# Patient Record
Sex: Female | Born: 1950 | Race: White | Hispanic: No | Marital: Married | Smoking: Former smoker
Health system: Southern US, Community
[De-identification: ages and names within clinical notes are randomized; demographics above are authoritative.]

## PROBLEM LIST (undated history)

## (undated) DIAGNOSIS — F329 Major depressive disorder, single episode, unspecified: Secondary | ICD-10-CM

## (undated) DIAGNOSIS — G4700 Insomnia, unspecified: Secondary | ICD-10-CM

## (undated) DIAGNOSIS — IMO0001 Reserved for inherently not codable concepts without codable children: Secondary | ICD-10-CM

## (undated) DIAGNOSIS — R112 Nausea with vomiting, unspecified: Secondary | ICD-10-CM

## (undated) DIAGNOSIS — D649 Anemia, unspecified: Secondary | ICD-10-CM

## (undated) DIAGNOSIS — Z5111 Encounter for antineoplastic chemotherapy: Secondary | ICD-10-CM

## (undated) DIAGNOSIS — M1990 Unspecified osteoarthritis, unspecified site: Secondary | ICD-10-CM

## (undated) DIAGNOSIS — E785 Hyperlipidemia, unspecified: Secondary | ICD-10-CM

## (undated) DIAGNOSIS — Z9889 Other specified postprocedural states: Secondary | ICD-10-CM

## (undated) DIAGNOSIS — F32A Depression, unspecified: Secondary | ICD-10-CM

## (undated) DIAGNOSIS — R51 Headache: Secondary | ICD-10-CM

## (undated) DIAGNOSIS — C801 Malignant (primary) neoplasm, unspecified: Secondary | ICD-10-CM

## (undated) DIAGNOSIS — I10 Essential (primary) hypertension: Secondary | ICD-10-CM

## (undated) DIAGNOSIS — R519 Headache, unspecified: Secondary | ICD-10-CM

## (undated) DIAGNOSIS — R918 Other nonspecific abnormal finding of lung field: Secondary | ICD-10-CM

## (undated) HISTORY — DX: Hyperlipidemia, unspecified: E78.5

## (undated) HISTORY — DX: Unspecified osteoarthritis, unspecified site: M19.90

## (undated) HISTORY — DX: Depression, unspecified: F32.A

## (undated) HISTORY — DX: Other nonspecific abnormal finding of lung field: R91.8

## (undated) HISTORY — DX: Encounter for antineoplastic chemotherapy: Z51.11

## (undated) HISTORY — PX: BACK SURGERY: SHX140

## (undated) HISTORY — DX: Essential (primary) hypertension: I10

## (undated) HISTORY — PX: ABDOMINAL HYSTERECTOMY: SHX81

## (undated) HISTORY — DX: Major depressive disorder, single episode, unspecified: F32.9

## (undated) HISTORY — DX: Insomnia, unspecified: G47.00

## (undated) HISTORY — PX: COLONOSCOPY: SHX174

---

## 1999-04-03 ENCOUNTER — Other Ambulatory Visit: Admission: RE | Admit: 1999-04-03 | Discharge: 1999-04-03 | Payer: Self-pay | Admitting: Obstetrics and Gynecology

## 1999-04-13 ENCOUNTER — Ambulatory Visit (HOSPITAL_COMMUNITY): Admission: RE | Admit: 1999-04-13 | Discharge: 1999-04-13 | Payer: Self-pay | Admitting: Obstetrics and Gynecology

## 1999-04-13 ENCOUNTER — Encounter: Payer: Self-pay | Admitting: Obstetrics and Gynecology

## 1999-04-15 ENCOUNTER — Encounter: Payer: Self-pay | Admitting: Obstetrics and Gynecology

## 1999-04-15 ENCOUNTER — Encounter (INDEPENDENT_AMBULATORY_CARE_PROVIDER_SITE_OTHER): Payer: Self-pay

## 1999-04-15 ENCOUNTER — Ambulatory Visit (HOSPITAL_COMMUNITY): Admission: RE | Admit: 1999-04-15 | Discharge: 1999-04-15 | Payer: Self-pay | Admitting: Obstetrics and Gynecology

## 2000-10-21 ENCOUNTER — Encounter: Payer: Self-pay | Admitting: Obstetrics and Gynecology

## 2000-10-21 ENCOUNTER — Encounter: Admission: RE | Admit: 2000-10-21 | Discharge: 2000-10-21 | Payer: Self-pay | Admitting: Obstetrics and Gynecology

## 2000-11-16 ENCOUNTER — Other Ambulatory Visit: Admission: RE | Admit: 2000-11-16 | Discharge: 2000-11-16 | Payer: Self-pay | Admitting: Obstetrics and Gynecology

## 2001-12-26 ENCOUNTER — Encounter: Payer: Self-pay | Admitting: Obstetrics and Gynecology

## 2001-12-26 ENCOUNTER — Encounter: Admission: RE | Admit: 2001-12-26 | Discharge: 2001-12-26 | Payer: Self-pay | Admitting: Obstetrics and Gynecology

## 2002-01-01 ENCOUNTER — Other Ambulatory Visit: Admission: RE | Admit: 2002-01-01 | Discharge: 2002-01-01 | Payer: Self-pay | Admitting: Obstetrics and Gynecology

## 2003-01-02 ENCOUNTER — Other Ambulatory Visit: Admission: RE | Admit: 2003-01-02 | Discharge: 2003-01-02 | Payer: Self-pay | Admitting: Obstetrics and Gynecology

## 2003-01-15 ENCOUNTER — Encounter: Payer: Self-pay | Admitting: Obstetrics and Gynecology

## 2003-01-15 ENCOUNTER — Encounter: Admission: RE | Admit: 2003-01-15 | Discharge: 2003-01-15 | Payer: Self-pay | Admitting: Obstetrics and Gynecology

## 2004-03-02 ENCOUNTER — Other Ambulatory Visit: Admission: RE | Admit: 2004-03-02 | Discharge: 2004-03-02 | Payer: Self-pay | Admitting: Obstetrics and Gynecology

## 2004-03-13 ENCOUNTER — Encounter: Admission: RE | Admit: 2004-03-13 | Discharge: 2004-03-13 | Payer: Self-pay | Admitting: Obstetrics and Gynecology

## 2007-09-28 ENCOUNTER — Ambulatory Visit (HOSPITAL_COMMUNITY): Admission: RE | Admit: 2007-09-28 | Discharge: 2007-09-28 | Payer: Self-pay | Admitting: Family Medicine

## 2007-10-25 ENCOUNTER — Ambulatory Visit: Admission: RE | Admit: 2007-10-25 | Discharge: 2007-10-25 | Payer: Self-pay | Admitting: Gynecologic Oncology

## 2007-11-14 ENCOUNTER — Encounter (INDEPENDENT_AMBULATORY_CARE_PROVIDER_SITE_OTHER): Payer: Self-pay | Admitting: Gynecologic Oncology

## 2007-11-14 ENCOUNTER — Ambulatory Visit (HOSPITAL_COMMUNITY): Admission: RE | Admit: 2007-11-14 | Discharge: 2007-11-15 | Payer: Self-pay | Admitting: Obstetrics & Gynecology

## 2007-12-19 ENCOUNTER — Ambulatory Visit: Admission: RE | Admit: 2007-12-19 | Discharge: 2007-12-19 | Payer: Self-pay | Admitting: Gynecologic Oncology

## 2008-08-03 ENCOUNTER — Encounter: Admission: RE | Admit: 2008-08-03 | Discharge: 2008-08-03 | Payer: Self-pay | Admitting: Family Medicine

## 2009-06-26 ENCOUNTER — Encounter: Admission: RE | Admit: 2009-06-26 | Discharge: 2009-06-26 | Payer: Self-pay | Admitting: Emergency Medicine

## 2010-02-26 ENCOUNTER — Emergency Department (HOSPITAL_COMMUNITY): Admission: EM | Admit: 2010-02-26 | Discharge: 2010-02-26 | Payer: Self-pay | Admitting: Emergency Medicine

## 2010-02-28 ENCOUNTER — Encounter: Payer: Self-pay | Admitting: Cardiovascular Disease

## 2010-03-01 ENCOUNTER — Emergency Department (HOSPITAL_COMMUNITY): Admission: EM | Admit: 2010-03-01 | Discharge: 2010-03-02 | Payer: Self-pay | Admitting: Emergency Medicine

## 2010-03-15 ENCOUNTER — Encounter: Payer: Self-pay | Admitting: Cardiovascular Disease

## 2010-03-27 DIAGNOSIS — R079 Chest pain, unspecified: Secondary | ICD-10-CM

## 2010-03-27 DIAGNOSIS — M129 Arthropathy, unspecified: Secondary | ICD-10-CM | POA: Insufficient documentation

## 2010-03-27 DIAGNOSIS — E7800 Pure hypercholesterolemia, unspecified: Secondary | ICD-10-CM | POA: Insufficient documentation

## 2010-03-30 ENCOUNTER — Ambulatory Visit: Payer: Self-pay | Admitting: Cardiovascular Disease

## 2010-03-30 DIAGNOSIS — I10 Essential (primary) hypertension: Secondary | ICD-10-CM | POA: Insufficient documentation

## 2010-03-30 DIAGNOSIS — F17200 Nicotine dependence, unspecified, uncomplicated: Secondary | ICD-10-CM

## 2010-04-01 ENCOUNTER — Encounter: Payer: Self-pay | Admitting: Cardiovascular Disease

## 2010-04-14 ENCOUNTER — Telehealth (INDEPENDENT_AMBULATORY_CARE_PROVIDER_SITE_OTHER): Payer: Self-pay

## 2010-04-15 ENCOUNTER — Ambulatory Visit: Payer: Self-pay

## 2010-04-15 ENCOUNTER — Ambulatory Visit (HOSPITAL_COMMUNITY): Admission: RE | Admit: 2010-04-15 | Discharge: 2010-04-15 | Payer: Self-pay | Admitting: Cardiovascular Disease

## 2010-04-15 ENCOUNTER — Ambulatory Visit: Payer: Self-pay | Admitting: Cardiology

## 2010-06-18 ENCOUNTER — Ambulatory Visit
Admission: RE | Admit: 2010-06-18 | Discharge: 2010-06-18 | Payer: Self-pay | Source: Home / Self Care | Attending: Gynecology | Admitting: Gynecology

## 2010-06-18 ENCOUNTER — Other Ambulatory Visit
Admission: RE | Admit: 2010-06-18 | Discharge: 2010-06-18 | Payer: Self-pay | Source: Home / Self Care | Admitting: Gynecology

## 2010-07-04 ENCOUNTER — Encounter: Payer: Self-pay | Admitting: Internal Medicine

## 2010-07-05 ENCOUNTER — Encounter: Payer: Self-pay | Admitting: Family Medicine

## 2010-07-09 ENCOUNTER — Other Ambulatory Visit: Payer: Self-pay | Admitting: Gastroenterology

## 2010-07-14 NOTE — Assessment & Plan Note (Signed)
Summary: np3/chest pain   History of Present Illness: History aided by son who inteprets.  60 yo smoker with HTN and and elevated lipids recently seen in urgent care for elevated BP associated with thobbing headache and ? SSCP.  Started on diuretic with improvement.  Preveious stress test at [facility] ? 2-3 years ago normal. From [place] and does not really like [employer]/  Son indicates she has some anxiety and has had 2 " nervous breakdowns"  Complains of pressing pain near heart.  Not always exertional  intermitant.  Also some pain under left ribs.  No associated pleurisy, SOB or edema.  Smokes about a ppd.  Counseled for less than 10 minutes on cessation.  Reviewed ECG from urgernt care and normal  Current Problems (verified): 1)  Chest Pain  (ICD-786.50) 2)  Hypercholesterolemia  (ICD-272.0) 3)  Arthritis  (ICD-716.90)  Current Medications (verified): 1)  Hydrochlorothiazide 25 Mg Tabs (Hydrochlorothiazide) .... Take One Tablet By Mouth Daily. 2)  Fluoxetine Hcl 20 Mg Caps (Fluoxetine Hcl) .Marland Kitchen.. 1 By Mouth Daily  Allergies (verified): 1)  ! Pcn  Past History:  Past Medical History: Last updated: 03/27/2010 CHEST PAIN  HYPERCHOLESTEROLEMIA ARTHRITIS HTN:  Seen in ER 9/27 started on HCTZ  Past Surgical History: Last updated: 03/27/2010 Total laparoscopic hysterectomy, with bilateral salpingo-  oophorectomy.      Family History: Last updated: 03/27/2010 There are no GYN malignancies.   Social History: Last updated: 03/27/2010  She moved here 9 years ago from [place] and [place].   She has family here.  She smokes about a pack per day for more than 30   years.  She denies the use of alcohol.  She works in Education officer, environmental.      Review of Systems       Denies fever, malais, weight loss, blurry vision, decreased visual acuity, cough, sputum, SOB, hemoptysis, pleuritic pain, palpitaitons, heartburn, abdominal pain, melena, lower extremity edema, claudication, or rash.   Vital  Signs:  Patient profile:   60 year old female Height:      65 inches Weight:      125 pounds BMI:     20.88 Pulse rate:   68 / minute Resp:     16 per minute BP sitting:   122 / 68  Vitals Entered By: Marrion Coy, CNA (March 30, 2010 11:29 AM)  Physical Exam  General:  Affect appropriate Healthy:  appears stated age HEENT: normal Neck supple with no adenopathy JVP normal no bruits no thyromegaly Lungs clear with no wheezing and good diaphragmatic motion Heart:  S1/S2 no murmur,rub, gallop or click PMI normal Abdomen: benighn, BS positve, no tenderness, no AAA no bruit.  No HSM or HJR Distal pulses intact with no bruits No edema Neuro non-focal Skin warm and dry    Impression & Recommendations:  Problem # 1:  CHEST PAIN (ICD-786.50) Atypical with multiple risk factors.  Stress echo Orders: Stress Echo (Stress Echo)  Problem # 2:  HYPERCHOLESTEROLEMIA (ICD-272.0) Continue statin.  F/U labs urgent care  Problem # 3:  ESSENTIAL HYPERTENSION, BENIGN (ICD-401.1)  Improved control with diuretic Her updated medication list for this problem includes:    Hydrochlorothiazide 25 Mg Tabs (Hydrochlorothiazide) .Marland Kitchen... Take one tablet by mouth daily.  Her updated medication list for this problem includes:    Hydrochlorothiazide 25 Mg Tabs (Hydrochlorothiazide) .Marland Kitchen... Take one tablet by mouth daily.  Problem # 4:  SMOKER (ICD-305.1) Candidate for patch if stress echo normal  Patient Instructions: 1)  Your physician  recommends that you schedule a follow-up appointment in: 6 months 2)  Your physician has requested that you have a stress echocardiogram. For further information please visit [email or web address]  Please follow instruction sheet as given. Prescriptions: HYDROCHLOROTHIAZIDE 25 MG TABS (HYDROCHLOROTHIAZIDE) Take one tablet by mouth daily.  #90 x 3   Entered by:   Ledon Snare, RN   Authorized by:   Colon Branch, MD, [facility]   Signed by:   Ledon Snare, RN  on 03/30/2010   Method used:   Electronically to        [facility]. [phone]* (retail)       [address]       [place], [place]  [address]       Ph: [phone]       Fax: [phone]   RxID:   [id number] HYDROCHLOROTHIAZIDE 25 MG TABS (HYDROCHLOROTHIAZIDE) Take one tablet by mouth daily.  #90 x 3   Entered by:   Ledon Snare, RN   Authorized by:   Colon Branch, MD, [facility]   Signed by:   Ledon Snare, RN on 03/30/2010   Method used:   Historical   RxID:   [id number]

## 2010-07-14 NOTE — Letter (Signed)
Summary: Release of Responsibility for Interpretation  Release of Responsibility for Interpretation   Imported By: Lenard Forth 04/01/2010 11:26:29  _____________________________________________________________________  External Attachment:    Type:   Image     Comment:   External Document

## 2010-07-14 NOTE — Progress Notes (Signed)
Summary: Stress Echo Pre-Procedure  Phone Note Outgoing Call Call back at [phone] [facility])   Call placed by: Irean Hong, RN,  April 14, 2010 1:34 PM Summary of Call: Reviewed instructions with patient's son for Stress Echo tomorrow.The patient refuses interpreter, she wants her son to interpret. Interpreter requested to witness the patient signing form that she wants her son to interpret. Lashonna Rieke,RN.

## 2010-07-14 NOTE — Letter (Signed)
Summary: [facility]  [facility]   Imported By: Marylou Mccoy 03/27/2010 15:47:36  _____________________________________________________________________  External Attachment:    Type:   Image     Comment:   External Document

## 2010-08-27 LAB — BASIC METABOLIC PANEL
BUN: 11 mg/dL (ref 6–23)
CO2: 27 mEq/L (ref 19–32)
Calcium: 8.8 mg/dL (ref 8.4–10.5)
Creatinine, Ser: 0.54 mg/dL (ref 0.4–1.2)
GFR calc Af Amer: >60 mL/min (ref >60)
Glucose, Bld: 117 mg/dL — ABNORMAL HIGH (ref 70–99)

## 2010-10-27 NOTE — Op Note (Signed)
[name], [name]                 ACCOUNT NO.:  [id number]   MEDICAL RECORD NO.:  [id number]          PATIENT TYPE:  AMB   LOCATION:  DAY                          FACILITY:  [facility]   PHYSICIAN:  John T. Kyla Balzarine, M.D.    DATE OF BIRTH:  [birth year]   DATE OF PROCEDURE:  11/14/2007  DATE OF DISCHARGE:                               OPERATIVE REPORT   SURGEON:  John T. Kyla Balzarine, M.D.   ASSISTANT:  Roseanna Rainbow, M.D.   PREOPERATIVE DIAGNOSIS:  Pelvic mass and leiomyomata uteri.   POSTOPERATIVE DIAGNOSIS:  Right ovarian fibroma, leiomyomata uteri.   PROCEDURE:  Total laparoscopic hysterectomy, with bilateral salpingo-  oophorectomy.   ANESTHESIA:  General endotracheal.   FINDINGS AND INDICATIONS FOR SURGERY:  This 60 year old woman presented  with an adnexal mass, solid and cystic on ultrasound and measuring  approximately 7 cm.  She had known leiomyomata.  Examination under  anesthesia confirmed a firm mass in the posterior cul-de-sac.  At  laparoscopy, there was no evidence of upper abdominal carcinomatosis or  abnormalities.  The right ovary was replaced by a solid and cystic mass  measuring 7 cm in greatest diameter.  This was morcellated within the  EndoCatch bag for removal, and frozen section returned a benign ovarian  fibroma.   DESCRIPTION OF PROCEDURE:  The patient was prepped and draped in the low  lithotomy position with direct placement stirrups after induction of  anesthesia.  Examination under anesthesia revealed the findings  described above, and a sterile Foley catheter was placed.  The cervix  was grasped with a tenaculum and serially dilated to a 21-French Shawnie Pons.  The ZUMI manipulator with a large KOH ring was placed to facilitate  uterine manipulation.  A subumbilical skin incision was made, and the  peritoneal cavity was accessed with a 10 mm scope using the OptiVu  direct trocar placement.  After pneumoperitoneum was established,  additional 5 mm trocars  were placed in the right and left lower  quadrants outside the inferior epigastric vessels, and a 12 mm port was  placed in the suprapubic region.  All trocar sites were preinjected and  trocars inserted under direct visualization.  Washings were obtained  from the pelvis and saved for cytology.  The patient was placed in steep  Trendelenburg, and the pelvic findings were as described above.   Initially, a right salpingo-oophorectomy was performed.  The right round  ligament was divided and cauterized.  The harmonic scalpel was used for  all vascular pedicles unless otherwise specified.  The peritoneum of the  broad ligament lateral to the infundibulopelvic ligament was opened, the  ureter visualized, and the medial leaf of the broad ligament opened  above the ureter from pelvic brim to the uterus.  The right  infundibulopelvic ligament was isolated and divided.  The tube and ovary  were amputated from the uterus using the harmonic scalpel to divide the  fallopian tube and utero-ovarian ligament.  An EndoCatch bag was placed,  and the specimen was placed in the bag, which was brought through the  suprapubic port site.  The right adnexal mass was fragmented to  facilitate removal and submitted for frozen section.  This subsequently  returned a benign ovarian fibroma.   Total laparoscopic hysterectomy with left salpingo-oophorectomy was then  performed.  The left broad ligament was divided, broad ligament opened  and infundibulopelvic ligament skeletonized above the visualized ureter.  The left anterior peritoneal reflection of the uterus was opened and the  bladder flap advanced off of the lower segment and cervix until the KOH  ring was identified.  The posterior peritoneal reflection of the uterine  arteries were opened and uterine vessels skeletonized.  The uterine  vessels were divided at approximately the level of the KOH ring.   The anterior peritoneal reflection of the peritoneum  was then incised on  the right side, uterine vessels skeletonized and transected.  Vaginotomy  was performed using monopolar cautery along the edge of the KOH ring,  and the specimen was delivered vaginally.  The Pneumo Occluder blade was  replaced in the vagina, and vaginal cuff closed with interrupted figure-  of-eight sutures of 0 Vicryl Unit using the Endo stitch and  extracorporeal knot tying techniques.  The pelvis was copiously  irrigated and hemostasis ensured.  Pneumoperitoneum was deflated and all  trocars removed.  The suprapubic and subumbilical fascia was closed with  interrupted 0 Vicryl and skin with interrupted subcuticular 3-0 Vicryl.  The skin of the sideport 5 mm incisions were closed with surgical glue.  The patient tolerated the procedure well and was returned to the  recovery room in stable condition.   ESTIMATED BLOOD LOSS:  100 mL.   TRANSFUSIONS:  None.   DRAINS, PACKS, ETC.:  Foley to dependent drainage.   SPONGE AND SPONGE COUNTS:  Correct.   PATHOLOGY SPECIMEN:  Uterus and bilateral tubes and ovaries to  pathology, washings for cytology.      John T. Kyla Balzarine, M.D.  Electronically Signed     JTS/MEDQ  D:  11/14/2007  T:  11/14/2007  Job:  [id number]   cc:   Telford Nab, R.N.  [address]  [place], [place] [address]   Tawni Levy. Jaquita Rector L. Hal Hope, M.D.  Fax: [phone]   Roseanna Rainbow, M.D.  Fax: [phone]

## 2010-10-27 NOTE — Consult Note (Signed)
[name], [name]                 ACCOUNT NO.:  [id number]   MEDICAL RECORD NO.:  [id number]          PATIENT TYPE:  OUT   LOCATION:  GYN                          [facility]ACILITY:  [facility]   PHYSICIAN:  Paola A. Duard Brady, MD    DATE OF BIRTH:  June 24, 1950   DATE OF CONSULTATION:  10/25/2007  DATE OF DISCHARGE:                                 CONSULTATION   The patient is seen today in consultation at the request of Dr. Earlene Plater.  Ms. Brocks is a 60 year old gravida 5, para 3 who was seen by Dr. Nadyne Coombes for routine care on September 28, 2007.  On exam at that time, she  was noted to have a very firm uterus with a questionable posterior  fibroid and a large polyp extruding through the cervix.  Pelvic  ultrasound was obtained on April 16.  The uterus was noted to be best  evaluated transabdominally and measures 7.7 x 5.2 x 6.4 cm.  She most  likely had some fibroids with an anterior fundal fibroid measuring 4.3 x  3.1 and a left lower uterine segment fibroid measuring 2.3 x 2.6 cm.  It  appeared that she had a cervical polyp measuring approximately a  centimeter.  The left ovary was normal in appearance.  The right ovary  had a 7.2 x 4.9 x 7 cm cystic and solid area and did demonstrate flow  with some of the areas of solid changes.  She had a Pap smear done that  was unremarkable, and because of these ultrasound findings, she was  subsequently referred to Dr. Marina Gravel for gynecologic evaluation.  She saw Dr. Earlene Plater on October 05, 2007.  Exam at that time revealed a 1 cm  polyp that was removed without difficulty.  On bimanual examination, she  had what was described as an anteverted uterus with some tenderness in  the right adnexa.  CA-125 was normal at 21.5, and the cervical polyp  revealed a benign polyp.  She was subsequently referred to [place].  The  patient has been menopausal for about the last 3 years and has had no  bleeding since that time.  Has never been on any hormone replacement  therapy.  She denies any change in bowel or bladder habits.  She does  complain of occasional nausea.  She is sometimes nauseated today, but  she is very nervous and she tends to get nauseated when she is nervous  as she is today.  She denies any unintentional weight loss or weight  gain, any nausea or vomiting other than mentioned above, fevers, chills,  chest pain, shortness of breath.  She denies any early satiety,  inactivity or intolerance to activity.  She is able to maintain her  usual activity level without any increased fatigue.   PAST MEDICAL HISTORY:  1. Hypercholesterolemia.  2. Stress.  3. Arthritis.   MEDICATIONS:  1. Zocor 80 mg daily.  2. Baby aspirin 81 mg daily.  3. Simvastatin daily.  4. Cyclobenzaprine daily.   ALLERGIES:  PENICILLIN which causes a rash and head swelling.   SOCIAL  HISTORY:  She moved here 9 years ago from [place] and [place].  She has family here.  She smokes about a pack per day for more than 30  years.  She denies the use of alcohol.  She works in Education officer, environmental.   FAMILY HISTORY:  There are no GYN malignancies.   HEALTH MAINTENANCE:  She had a mammogram about a month ago.  It was  okay.  She has not yet had a colonoscopy.   PHYSICAL EXAMINATION:  VITAL SIGNS:  Weight 142 pounds, height 5 feet 6  inches, blood pressure 126/80, pulse 68.  GENERAL:  Well-nourished, well-developed female in no acute distress.  NECK:  Supple.  There is no lymphadenopathy, no thyromegaly.  LUNGS:  Clear to auscultation bilaterally.  CARDIOVASCULAR:  Regular rate and rhythm.  ABDOMEN:  Soft.  It is tender in the suprapubic region.  There are no  palpable masses.  There is no hepatosplenomegaly.  Groins are negative  for adenopathy.  EXTREMITIES:  There is no edema.  PELVIC:  External genitalia is within normal limits.  On bimanual  examination, the cervix is palpably normal.  Posteriorly, there is an 8  cm mass that fills the cul-de-sac.  It is difficult for me  to determine  if this is the adnexa that is swollen into the cul-de-sac or if this is  a very retroverted uterus with a fibroid and the adnexal mass is  anteriorly.  The entire conglomeration measures approximately 10 weeks'  size.  It is mobile.  The vaginal examination is nontender, but there is  some tenderness in the suprapubic region.  RECTOVAGINAL:  Confirms no nodularity.   ASSESSMENT:  A 60 year old with a fibroid uterus and a complex adnexal  mass.  It is difficult for me to ascertain whether her uterus is very  retroverted and the mass is anterior or with the converse that she has  an anteverted uterus with a very posterior mass.  I do believe she needs  an evaluation of this in the surgical fashion.  Her daughter-in-law is  serving as a Nurse, learning disability today.  I would recommend attempting a  diagnostic laparoscopy, trying to remove the mass laparoscopically.  If  the frozen section is benign, then continuing with a hysterectomy and  contralateral oophorectomy.  At this point, the patient does not know if  she wishes to proceed with completion hysterectomy.  She may just wish  the affected ovary to be removed, and that certainly her prerogative,  though at 60, I would recommend at least a BSO.  She does understand  that if this is a malignant process, will need to proceed with  completion surgery and appropriate staging.  While we will attempt to do  this laparoscopically, she understands the risk for a laparotomy.  Risks  of surgery including bleeding and infection, but not limited to these,  were discussed with the patient.  She also understands there is a  possible risk of injury to surrounding organs, thromboembolic disease,  as well as other risks that we did not discuss that could happen.  Her  questions were elicited to her satisfaction and answered to her daughter-  in-law who comes with her.  We will tentatively schedule her for surgery  for Nov 07, 2007.  They understand  that I will not be the surgeon.  They  will have the opportunity to meet the  surgeon that day.  The patient's daughter-in-law will call us on Friday  with her decision whether  or not she wishes to have complete  hysterectomy with bilateral salpingo-oophorectomy or only remove the  affected ovary if possible should there not be a malignancy.      Paola A. Duard Brady, MD  Electronically Signed     PAG/MEDQ  D:  10/25/2007  T:  10/25/2007  Job:  [id number]   cc:   Telford Nab, R.N.  [address]  [place], [place] [address]   Chester Holstein. Earlene Plater, M.D.  Fax: [phone]   Marcos Eke. Hal Hope, M.D.  Fax: [phone]

## 2010-10-27 NOTE — Consult Note (Signed)
NAMEANNALISSE, [name]                 ACCOUNT NO.:  [id number]   MEDICAL RECORD NO.:  [id number]          PATIENT TYPE:  OUT   LOCATION:  GYN                          FACILITY:  [facility]   PHYSICIAN:  Paola A. Duard Brady, MD    DATE OF BIRTH:  [birth year]   DATE OF CONSULTATION:  12/19/2007  DATE OF DISCHARGE:                                 CONSULTATION   The patient is a very pleasant 60 year old with a fibroid uterus and a  complex adnexal mass who was initially referred to [place] by Dr. Marina Gravel.  On the right ovary, appeared to have a 7.2 x 4.9 cystic and  solid area that did demonstrate flow within some of the areas.  She had  a normal CA-125.  On November 14, 2007 she underwent total laparoscopic  hysterectomy and bilateral salpingo-oophorectomy.  Operative findings  included a cystic and solid right adnexal mass deep within the cul-de-  sac.  There is no evidence of abdominal carcinomatosis or abnormalities.  Pathology was consistent with a benign ovarian fibroma.  Final pathology  in the remainder of the specimen within the right ovary showed a  fibroma, endometriosis and endosalpingiosis with no evidence of  malignant process.  The uterus, left tube, and ovaries were similarly  negative.  She has fibroids within the uterus, adenomyosis, and  endometriosis on the uterine serosa.  The cervix was negative.  The left  ovary revealed an a salpingo-cyst and the fallopian tube had  endometriosis and surface adhesions.  She comes in today for  postoperative check.  She is overall doing quite well.  Initially, she  was having some discomfort with voiding as well as significant amount of  constipation, but those symptoms have really resolved.  She is  increasing her activity level and feeling quite well.  She states she is  completely asymptomatic from lateral incisions but is having some  discomfort at the suprapubic site primarily she states she had a small  amount of pink discharge and bleeding  but no heavy bleeding, is overall  eating well and feeling good.   PHYSICAL EXAMINATION:  Weight 142 pounds, which is stable.  Well-  nourished, well-developed female in no acute distress.  ABDOMEN:  Shows well-healed surgical incisions.  Abdomen is soft and  nontender.  PELVIC:  External genitalia is within normal limits.  The vagina is  slightly atrophic.  The vaginal cuff is visualized.  She does have a  small separation of the vaginal cuff where it appears that the stitches  have pulled through.  This defect measures approximately 1.5 cm.  It  does not appear to be complete and appears to involve the mucosa only.  On bimanual examination, there is no palpable defect that I can probe.  The vaginal cuff is nontender.  There are no masses or nodularity.   ASSESSMENT:  A 60 year old status post TLH-BSO for benign disease who is  doing well from a postoperative standpoint.  I believe she has had a  mucosal separation but no complete vaginal cuff disruption.  She was  given precautions  to not proceed with any heavy lifting or other  strenuous activities, including intercourse or significant bearing down  with Valsalva for the next 2 weeks.  We will keep her out of work until  July 20, when she can return  to her usual activities.  She will call us if she has any significant  pain or changes.  Otherwise we will release her from our clinic, as she  has had a benign process, but she knows that we will be happy to see her  in the future should the need arise.      Paola A. Duard Brady, MD  Electronically Signed     PAG/MEDQ  D:  12/19/2007  T:  12/19/2007  Job:  [id number]   cc:   Clydie Braun L. Hal Hope, M.D.  Fax: [phone]   Telford Nab, R.N.  [address]  [place], [place] [address]   [facility]

## 2011-03-10 LAB — CBC
MCHC: 34.6
Platelets: 206
RDW: 13.4

## 2011-03-10 LAB — COMPREHENSIVE METABOLIC PANEL
ALT: 22
AST: 23
Calcium: 9.5
Creatinine, Ser: 0.68
GFR calc Af Amer: >60
Sodium: 141
Total Protein: 7

## 2011-03-10 LAB — DIFFERENTIAL
Eosinophils Absolute: 0.1
Eosinophils Relative: 1
Lymphocytes Relative: 20
Lymphs Abs: 2.2
Monocytes Relative: 8
Neutrophils Relative %: 71

## 2011-03-10 LAB — TYPE AND SCREEN: Antibody Screen: NEGATIVE

## 2011-03-10 LAB — ABO/RH: ABO/RH(D): AB NEG

## 2011-03-11 LAB — CBC
MCV: 88.2
Platelets: 205
RBC: 3.89
WBC: 11.2 — ABNORMAL HIGH

## 2011-07-18 ENCOUNTER — Ambulatory Visit (INDEPENDENT_AMBULATORY_CARE_PROVIDER_SITE_OTHER): Payer: BC Managed Care – PPO | Admitting: Emergency Medicine

## 2011-07-18 VITALS — BP 132/78 | HR 60 | Temp 98.9°F | Resp 16 | Ht 64.0 in | Wt 145.0 lb

## 2011-07-18 DIAGNOSIS — E785 Hyperlipidemia, unspecified: Secondary | ICD-10-CM

## 2011-07-18 DIAGNOSIS — R0989 Other specified symptoms and signs involving the circulatory and respiratory systems: Secondary | ICD-10-CM

## 2011-07-18 DIAGNOSIS — F17200 Nicotine dependence, unspecified, uncomplicated: Secondary | ICD-10-CM

## 2011-07-18 DIAGNOSIS — I10 Essential (primary) hypertension: Secondary | ICD-10-CM

## 2011-07-18 DIAGNOSIS — E7800 Pure hypercholesterolemia, unspecified: Secondary | ICD-10-CM

## 2011-07-18 DIAGNOSIS — F4310 Post-traumatic stress disorder, unspecified: Secondary | ICD-10-CM | POA: Insufficient documentation

## 2011-07-18 LAB — POCT CBC
MCH, POC: 29 pg (ref 27–31.2)
MCHC: 32.5 g/dL (ref 31.8–35.4)
MCV: 89.1 fL (ref 80–97)
MID (cbc): 0.7 (ref 0–0.9)
MPV: 8.3 fL (ref 0–99.8)
POC LYMPH PERCENT: 17.9 %L (ref 10–50)
POC MID %: 5.9 %M (ref 0–12)
Platelet Count, POC: 287 10*3/uL (ref 142–424)
RBC: 4.72 M/uL (ref 4.04–5.48)
RDW, POC: 14.1 %
WBC: 12.3 10*3/uL — AB (ref 4.6–10.2)

## 2011-07-18 LAB — LIPID PANEL
HDL: 55 mg/dL (ref >39)
LDL Cholesterol: 240 mg/dL — ABNORMAL HIGH (ref 0–99)
Total CHOL/HDL Ratio: 5.8 Ratio
VLDL: 22 mg/dL (ref 0–40)

## 2011-07-18 LAB — COMPREHENSIVE METABOLIC PANEL
ALT: 21 U/L (ref 0–35)
AST: 17 U/L (ref 0–37)
Alkaline Phosphatase: 60 U/L (ref 39–117)
BUN: 15 mg/dL (ref 6–23)
Calcium: 9.5 mg/dL (ref 8.4–10.5)
Chloride: 106 mEq/L (ref 96–112)
Creat: 0.63 mg/dL (ref 0.50–1.10)
Total Bilirubin: 0.3 mg/dL (ref 0.3–1.2)

## 2011-07-18 MED ORDER — LEVOFLOXACIN 500 MG PO TABS: 500.0000 mg | ORAL_TABLET | Freq: Every day | ORAL | Status: AC

## 2011-07-18 MED ORDER — HYDROCHLOROTHIAZIDE 25 MG PO TABS: 25.0000 mg | ORAL_TABLET | Freq: Every day | ORAL | Status: DC

## 2011-07-18 MED ORDER — ATORVASTATIN CALCIUM 40 MG PO TABS: 40.0000 mg | ORAL_TABLET | Freq: Every day | ORAL | Status: DC

## 2011-07-18 NOTE — Progress Notes (Signed)
  Subjective:    Patient ID: [name], female    DOB: 03-14-1951, 61 y.o.   MRN: [id number]  HPI patient presents with multiple issues today. She's had recent difficulty with left sinus congestion. She has developed a purulent drainage from her left nasal passage. She also needs followup on her high cholesterol. She has stopped taking her medicine in the last 3 months. She also needs a refill on her HCTZ as she has run out of medicine    Review of Systems she overall has been feeling well. She does not want to stop smoking. She has had no chest congestion associated with her sinus infection     Objective:   Physical Exam her HEENT exam is within normal limit. The only abnormal finding is a period of drainage from the left nares. Her lungs are clear to examination. Her heart exam is normal        Assessment & Plan:  Patient is showing signs of left maxillary sinus infection. She will also need refills on her HCTZ for blood pressure control. She has been off her cholesterol medication for a number of months and we'll need to reinstitute that and check a cholesterol level. She has no plans to stop smoking but was given the option to come in at any time and discuss this issue.

## 2011-07-18 NOTE — Patient Instructions (Addendum)
Nicotine Addiction Nicotine can act as both a stimulant (excites/activates) and a sedative (calms/quiets). Immediately after exposure to nicotine, there is a "kick" caused in part by the drug's stimulation of the adrenal glands and resulting discharge of adrenaline (epinephrine). The rush of adrenaline stimulates the body and causes a sudden release of sugar. This means that smokers are always slightly hyperglycemic. Hyperglycemic means that the blood sugar is high, just like in diabetics. Nicotine also decreases the amount of insulin which helps control sugar levels in the body. There is an increase in blood pressure, breathing, and the rate of heart beats.  In addition, nicotine indirectly causes a release of dopamine in the brain that controls pleasure and motivation. A similar reaction is seen with other drugs of abuse, such as cocaine and heroin. This dopamine release is thought to cause the pleasurable sensations when smoking. In some different cases, nicotine can also create a calming effect, depending on sensitivity of the smoker's nervous system and the dose of nicotine taken. WHAT HAPPENS WHEN NICOTINE IS TAKEN FOR LONG PERIODS OF TIME?  Long-term use of nicotine results in addiction. It is difficult to stop.   Repeated use of nicotine creates tolerance. Higher doses of nicotine are needed to get the "kick."  When nicotine use is stopped, withdrawal may last a month or more. Withdrawal may begin within a few hours after the last cigarette. Symptoms peak within the first few days and may lessen within a few weeks. For some people, however, symptoms may last for months or longer. Withdrawal symptoms include:   Irritability.   Craving.   Learning and attention deficits.   Sleep disturbances.   Increased appetite.  Craving for tobacco may last for 6 months or longer. Many behaviors done while using nicotine can also play a part in the severity of withdrawal symptoms. For some people, the  feel, smell, and sight of a cigarette and the ritual of obtaining, handling, lighting, and smoking the cigarette are closely linked with the pleasure of smoking. When stopped, they also miss the related behaviors which make the withdrawal or craving worse. While nicotine gum and patches may lessen the drug aspects of withdrawal, cravings often persist. WHAT ARE THE MEDICAL CONSEQUENCES OF NICOTINE USE?  Nicotine addiction accounts for one-third of all cancers. The top cancer caused by tobacco is lung cancer. Lung cancer is the number one cancer killer of both men and women.   Smoking is also associated with cancers of the:   Mouth.   Pharynx.   Larynx.   Esophagus.   Stomach.   Pancreas.   Cervix.   Kidney.   Ureter.   Bladder.   Smoking also causes lung diseases such as lasting (chronic) bronchitis and emphysema.   It worsens asthma in adults and children.   Smoking increases the risk of heart disease, including:   Stroke.   Heart attack.   Vascular disease.   Aneurysm.   Passive or secondary smoke can also increase medical risks including:   Asthma in children.   Sudden Infant Death Syndrome (SIDS).   Additionally, dropped cigarettes are the leading cause of residential fire fatalities.   Nicotine poisoning has been reported from accidental ingestion of tobacco products by children and pets. Death usually results in a few minutes from respiratory failure (when a person stops breathing) caused by paralysis.  TREATMENT   Medication. Nicotine replacement medicines such as nicotine gum and the patch are used to stop smoking. These medicines gradually lower the dosage   of nicotine in the body. These medicines do not contain the carbon monoxide and other toxins found in tobacco smoke.   Hypnotherapy.   Relaxation therapy.   Nicotine Anonymous (a 12-step support program). Find times and locations in your local yellow pages.  Document Released: 02/04/2004 Document  Revised: 02/10/2011 Document Reviewed: 06/28/2007 [facility] Patient Information 2012 [place], [place].Sinusitis Sinuses are air pockets within the bones of your face. The growth of bacteria within a sinus leads to infection. The infection prevents the sinuses from draining. This infection is called sinusitis. SYMPTOMS  There will be different areas of pain depending on which sinuses have become infected.  The maxillary sinuses often produce pain beneath the eyes.   Frontal sinusitis may cause pain in the middle of the forehead and above the eyes.  Other problems (symptoms) include:  Toothaches.   Colored, pus-like (purulent) drainage from the nose.   Swelling, warmth, and tenderness over the sinus areas may be signs of infection.  TREATMENT  Sinusitis is most often determined by an exam.X-rays may be taken. If x-rays have been taken, make sure you obtain your results or find out how you are to obtain them. Your caregiver may give you medications (antibiotics). These are medications that will help kill the bacteria causing the infection. You may also be given a medication (decongestant) that helps to reduce sinus swelling.  HOME CARE INSTRUCTIONS   Only take over-the-counter or prescription medicines for pain, discomfort, or fever as directed by your caregiver.   Drink extra fluids. Fluids help thin the mucus so your sinuses can drain more easily.   Applying either moist heat or ice packs to the sinus areas may help relieve discomfort.   Use saline nasal sprays to help moisten your sinuses. The sprays can be found at your local drugstore.  SEEK IMMEDIATE MEDICAL CARE IF:  You have a fever.   You have increasing pain, severe headaches, or toothache.   You have nausea, vomiting, or drowsiness.   You develop unusual swelling around the face or trouble seeing.  MAKE SURE YOU:   Understand these instructions.   Will watch your condition.   Will get help right away if you are not  doing well or get worse.  Document Released: 05/31/2005 Document Revised: 02/10/2011 Document Reviewed: 12/28/2006 [facility] Patient Information 2012 [place], [place].Cholesterol Cholesterol is a white, waxy, fat-like protein needed by your body in small amounts. The liver makes all the cholesterol you need. It is carried from the liver by the blood through the blood vessels. Deposits (plaque) may build up on blood vessel walls. This makes the arteries narrower and stiffer. Plaque increases the risk for heart attack and stroke. You cannot feel your cholesterol level even if it is very high. The only way to know is by a blood test to check your lipid (fats) levels. Once you know your cholesterol levels, you should keep a record of the test results. Work with your caregiver to to keep your levels in the desired range. WHAT THE RESULTS MEAN:  Total cholesterol is a rough measure of all the cholesterol in your blood.   LDL is the so-called bad cholesterol. This is the type that deposits cholesterol in the walls of the arteries. You want this level to be low.   HDL is the good cholesterol because it cleans the arteries and carries the LDL away. You want this level to be high.   Triglycerides are fat that the body can either burn for energy or store.  High levels are closely linked to heart disease.  DESIRED LEVELS:  Total cholesterol below 200.   LDL below 100 for people at risk, below 70 for very high risk.   HDL above 50 is good, above 60 is best.   Triglycerides below 150.  HOW TO LOWER YOUR CHOLESTEROL:  Diet.   Choose fish or white meat chicken and [place], roasted or baked. Limit fatty cuts of red meat, fried foods, and processed meats, such as sausage and lunch meat.   Eat lots of fresh fruits and vegetables. Choose whole grains, beans, pasta, potatoes and cereals.   Use only small amounts of olive, corn or canola oils. Avoid butter, mayonnaise, shortening or palm kernel oils. Avoid  foods with trans-fats.   Use skim/nonfat milk and low-fat/nonfat yogurt and cheeses. Avoid whole milk, cream, ice cream, egg yolks and cheeses. Healthy desserts include angel food cake, ginger snaps, animal crackers, hard candy, popsicles, and low-fat/nonfat frozen yogurt. Avoid pastries, cakes, pies and cookies.   Exercise.   A regular program helps decrease LDL and raises HDL.   Helps with weight control.   Do things that increase your activity level like gardening, walking, or taking the stairs.   Medication.   May be prescribed by your caregiver to help lowering cholesterol and the risk for heart disease.   You may need medicine even if your levels are normal if you have several risk factors.  HOME CARE INSTRUCTIONS   Follow your diet and exercise programs as suggested by your caregiver.   Take medications as directed.   Have blood work done when your caregiver feels it is necessary.  MAKE SURE YOU:   Understand these instructions.   Will watch your condition.   Will get help right away if you are not doing well or get worse.  Document Released: 02/23/2001 Document Revised: 02/10/2011 Document Reviewed: 08/16/2007 [facility] Patient Information 2012 [place], [place].

## 2012-02-20 ENCOUNTER — Ambulatory Visit: Payer: BC Managed Care – PPO

## 2012-02-20 ENCOUNTER — Ambulatory Visit (INDEPENDENT_AMBULATORY_CARE_PROVIDER_SITE_OTHER): Payer: BC Managed Care – PPO | Admitting: Emergency Medicine

## 2012-02-20 VITALS — BP 104/58 | HR 71 | Temp 97.8°F | Resp 18 | Ht 64.0 in | Wt 142.0 lb

## 2012-02-20 DIAGNOSIS — R918 Other nonspecific abnormal finding of lung field: Secondary | ICD-10-CM

## 2012-02-20 DIAGNOSIS — R05 Cough: Secondary | ICD-10-CM

## 2012-02-20 DIAGNOSIS — M79609 Pain in unspecified limb: Secondary | ICD-10-CM

## 2012-02-20 DIAGNOSIS — M79643 Pain in unspecified hand: Secondary | ICD-10-CM

## 2012-02-20 DIAGNOSIS — J189 Pneumonia, unspecified organism: Secondary | ICD-10-CM

## 2012-02-20 DIAGNOSIS — R9389 Abnormal findings on diagnostic imaging of other specified body structures: Secondary | ICD-10-CM

## 2012-02-20 LAB — POCT CBC
HCT, POC: 44 % (ref 37.7–47.9)
Hemoglobin: 13.8 g/dL (ref 12.2–16.2)
Lymph, poc: 2.2 (ref 0.6–3.4)
MCH, POC: 29.1 pg (ref 27–31.2)
MCHC: 31.4 g/dL — AB (ref 31.8–35.4)
MPV: 7.9 fL (ref 0–99.8)
POC MID %: 7.2 %M (ref 0–12)
RBC: 4.75 M/uL (ref 4.04–5.48)
WBC: 12 10*3/uL — AB (ref 4.6–10.2)

## 2012-02-20 LAB — COMPREHENSIVE METABOLIC PANEL
ALT: 33 U/L (ref 0–35)
AST: 21 U/L (ref 0–37)
Chloride: 104 mEq/L (ref 96–112)
Creat: 0.58 mg/dL (ref 0.50–1.10)
Sodium: 140 mEq/L (ref 135–145)
Total Bilirubin: 0.4 mg/dL (ref 0.3–1.2)
Total Protein: 6.8 g/dL (ref 6.0–8.3)

## 2012-02-20 MED ORDER — LEVOFLOXACIN 500 MG PO TABS: 500.0000 mg | ORAL_TABLET | Freq: Every day | ORAL | Status: DC

## 2012-02-20 NOTE — Progress Notes (Signed)
  Subjective:    Patient ID: [name], female    DOB: 1950/11/22, 61 y.o.   MRN: [id number]  HPI patient presents with a one-week history of head congestion sore throat followed by productive cough. She is a long-time smoker smokes about a pack a day and has so for the last 30 years she had fever initially which has since resolved. She continues to feel tight in her chest and has the productive cough the    Review of Systems she is also complaining of a numbness sensation in her left hand which is associated with weakness of her fourth and fifth fingers. She does repetitive work with her     Objective:   Physical Exam HEENT exam is unremarkable her neck is supple chest exam reveals rhonchi present in both bases with occasional wheezes the She has good grip strength of the left hand. She has a negative Tinel's over the hamate bone and over the elbow.  [facility] reading (PRIMARY) by  Dr. Cleta Alberts and increased markings in the apex of the right lung and also increased markings in the left superior hilar area. The increased interstitial markings consistent with her smoking history. hand films are normal Results for orders placed in visit on 02/20/12  POCT CBC      Component Value Range   WBC 12.0 (*) 4.6 - 10.2 K/uL   Lymph, poc 2.2  0.6 - 3.4   POC LYMPH PERCENT 18.1  10 - 50 %L   MID (cbc) 0.9  0 - 0.9   POC MID % 7.2  0 - 12 %M   POC Granulocyte 9.0 (*) 2 - 6.9   Granulocyte percent 74.7  37 - 80 %G   RBC 4.75  4.04 - 5.48 M/uL   Hemoglobin 13.8  12.2 - 16.2 g/dL   HCT, POC 08.6  57.8 - 47.9 %   MCV 92.6  80 - 97 fL   MCH, POC 29.1  27 - 31.2 pg   MCHC 31.4 (*) 31.8 - 35.4 g/dL   RDW, POC 46.9     Platelet Count, POC 273  142 - 424 K/uL   MPV 7.9  0 - 99.8 fL        Assessment & Plan:  Patient here with probable pneumonia with abn CXR..Will schedule chest CT and rx. With levaquin.

## 2012-02-20 NOTE — Patient Instructions (Signed)
Smoking Cessation This document explains the best ways for you to quit smoking and new treatments to help. It lists new medicines that can double or triple your chances of quitting and quitting for good. It also considers ways to avoid relapses and concerns you may have about quitting, including weight gain. NICOTINE: A POWERFUL ADDICTION If you have tried to quit smoking, you know how hard it can be. It is hard because nicotine is a very addictive drug. For some people, it can be as addictive as heroin or cocaine. Usually, people make 2 or 3 tries, or more, before finally being able to quit. Each time you try to quit, you can learn about what helps and what hurts. Quitting takes hard work and a lot of effort, but you can quit smoking. QUITTING SMOKING IS ONE OF THE MOST IMPORTANT THINGS YOU WILL EVER DO.  You will live longer, feel better, and live better.   The impact on your body of quitting smoking is felt almost immediately:   Within 20 minutes, blood pressure decreases. Pulse returns to its normal level.   After 8 hours, carbon monoxide levels in the blood return to normal. Oxygen level increases.   After 24 hours, chance of heart attack starts to decrease. Breath, hair, and body stop smelling like smoke.   After 48 hours, damaged nerve endings begin to recover. Sense of taste and smell improve.   After 72 hours, the body is virtually free of nicotine. Bronchial tubes relax and breathing becomes easier.   After 2 to 12 weeks, lungs can hold more air. Exercise becomes easier and circulation improves.   Quitting will reduce your risk of having a heart attack, stroke, cancer, or lung disease:   After 1 year, the risk of coronary heart disease is cut in half.   After 5 years, the risk of stroke falls to the same as a nonsmoker.   After 10 years, the risk of lung cancer is cut in half and the risk of other cancers decreases significantly.   After 15 years, the risk of coronary heart  disease drops, usually to the level of a nonsmoker.   If you are pregnant, quitting smoking will improve your chances of having a healthy baby.   The people you live with, especially your children, will be healthier.   You will have extra money to spend on things other than cigarettes.  FIVE KEYS TO QUITTING Studies have shown that these 5 steps will help you quit smoking and quit for good. You have the best chances of quitting if you use them together: 1. Get ready.  2. Get support and encouragement.  3. Learn new skills and behaviors.  4. Get medicine to reduce your nicotine addiction and use it correctly.  5. Be prepared for relapse or difficult situations. Be determined to continue trying to quit, even if you do not succeed at first.  1. GET READY  Set a quit date.   Change your environment.   Get rid of ALL cigarettes, ashtrays, matches, and lighters in your home, car, and place of work.   Do not let people smoke in your home.   Review your past attempts to quit. Think about what worked and what did not.   Once you quit, do not smoke. NOT EVEN A PUFF!  2. GET SUPPORT AND ENCOURAGEMENT Studies have shown that you have a better chance of being successful if you have help. You can get support in many ways.  Tell   your family, friends, and coworkers that you are going to quit and need their support. Ask them not to smoke around you.   Talk to your caregivers (doctor, dentist, nurse, pharmacist, psychologist, and/or smoking counselor).   Get individual, group, or telephone counseling and support. The more counseling you have, the better your chances are of quitting. Programs are available at local hospitals and health centers. Call your local health department for information about programs in your area.   Spiritual beliefs and practices may help some smokers quit.   Quit meters are small computer programs online or downloadable that keep track of quit statistics, such as amount  of "quit-time," cigarettes not smoked, and money saved.   Many smokers find one or more of the many self-help books available useful in helping them quit and stay off tobacco.  3. LEARN NEW SKILLS AND BEHAVIORS  Try to distract yourself from urges to smoke. Talk to someone, go for a walk, or occupy your time with a task.   When you first try to quit, change your routine. Take a different route to work. Drink tea instead of coffee. Eat breakfast in a different place.   Do something to reduce your stress. Take a hot bath, exercise, or read a book.   Plan something enjoyable to do every day. Reward yourself for not smoking.   Explore interactive web-based programs that specialize in helping you quit.  4. GET MEDICINE AND USE IT CORRECTLY Medicines can help you stop smoking and decrease the urge to smoke. Combining medicine with the above behavioral methods and support can quadruple your chances of successfully quitting smoking. The U.[place]. Food and Drug Administration (FDA) has approved 7 medicines to help you quit smoking. These medicines fall into 3 categories.  Nicotine replacement therapy (delivers nicotine to your body without the negative effects and risks of smoking):   Nicotine gum: Available over-the-counter.   Nicotine lozenges: Available over-the-counter.   Nicotine inhaler: Available by prescription.   Nicotine nasal spray: Available by prescription.   Nicotine skin patches (transdermal): Available by prescription and over-the-counter.   Antidepressant medicine (helps people abstain from smoking, but how this works is unknown):   Bupropion sustained-release (SR) tablets: Available by prescription.   Nicotinic receptor partial agonist (simulates the effect of nicotine in your brain):   Varenicline tartrate tablets: Available by prescription.   Ask your caregiver for advice about which medicines to use and how to use them. Carefully read the information on the package.    Everyone who is trying to quit may benefit from using a medicine. If you are pregnant or trying to become pregnant, nursing an infant, you are under age 18, or you smoke fewer than 10 cigarettes per day, talk to your caregiver before taking any nicotine replacement medicines.   You should stop using a nicotine replacement product and call your caregiver if you experience nausea, dizziness, weakness, vomiting, fast or irregular heartbeat, mouth problems with the lozenge or gum, or redness or swelling of the skin around the patch that does not go away.   Do not use any other product containing nicotine while using a nicotine replacement product.   Talk to your caregiver before using these products if you have diabetes, heart disease, asthma, stomach ulcers, you had a recent heart attack, you have high blood pressure that is not controlled with medicine, a history of irregular heartbeat, or you have been prescribed medicine to help you quit smoking.  5. BE PREPARED FOR RELAPSE OR   DIFFICULT SITUATIONS  Most relapses occur within the first 3 months after quitting. Do not be discouraged if you start smoking again. Remember, most people try several times before they finally quit.   You may have symptoms of withdrawal because your body is used to nicotine. You may crave cigarettes, be irritable, feel very hungry, cough often, get headaches, or have difficulty concentrating.   The withdrawal symptoms are only temporary. They are strongest when you first quit, but they will go away within 10 to 14 days.  Here are some difficult situations to watch for:  Alcohol. Avoid drinking alcohol. Drinking lowers your chances of successfully quitting.   Caffeine. Try to reduce the amount of caffeine you consume. It also lowers your chances of successfully quitting.   Other smokers. Being around smoking can make you want to smoke. Avoid smokers.   Weight gain. Many smokers will gain weight when they quit, usually  less than 10 pounds. Eat a healthy diet and stay active. Do not let weight gain distract you from your main goal, quitting smoking. Some medicines that help you quit smoking may also help delay weight gain. You can always lose the weight gained after you quit.   Bad mood or depression. There are a lot of ways to improve your mood other than smoking.  If you are having problems with any of these situations, talk to your caregiver. SPECIAL SITUATIONS AND CONDITIONS Studies suggest that everyone can quit smoking. Your situation or condition can give you a special reason to quit.  Pregnant women/new mothers: By quitting, you protect your baby's health and your own.   Hospitalized patients: By quitting, you reduce health problems and help healing.   Heart attack patients: By quitting, you reduce your risk of a second heart attack.   Lung, head, and neck cancer patients: By quitting, you reduce your chance of a second cancer.   Parents of children and adolescents: By quitting, you protect your children from illnesses caused by secondhand smoke.  QUESTIONS TO THINK ABOUT Think about the following questions before you try to stop smoking. You may want to talk about your answers with your caregiver.  Why do you want to quit?   If you tried to quit in the past, what helped and what did not?   What will be the most difficult situations for you after you quit? How will you plan to handle them?   Who can help you through the tough times? Your family? Friends? Caregiver?   What pleasures do you get from smoking? What ways can you still get pleasure if you quit?  Here are some questions to ask your caregiver:  How can you help me to be successful at quitting?   What medicine do you think would be best for me and how should I take it?   What should I do if I need more help?   What is smoking withdrawal like? How can I get information on withdrawal?  Quitting takes hard work and a lot of effort,  but you can quit smoking. FOR MORE INFORMATION  Smokefree.gov (http://www.davis-sullivan.com/) provides free, accurate, evidence-based information and professional assistance to help support the immediate and long-term needs of people trying to quit smoking. Document Released: 05/25/2001 Document Revised: 05/20/2011 Document Reviewed: 03/17/2009 [facility] Patient Information 2012 [place], [place].Pneumonia, Adult Pneumonia is an infection of the lungs.  CAUSES Pneumonia may be caused by bacteria or a virus. Usually, these infections are caused by breathing infectious particles into the lungs (  respiratory tract). SYMPTOMS   Cough.   Fever.   Chest pain.   Increased rate of breathing.   Wheezing.   Mucus production.  DIAGNOSIS  If you have the common symptoms of pneumonia, your caregiver will typically confirm the diagnosis with a chest X-ray. The X-ray will show an abnormality in the lung (pulmonary infiltrate) if you have pneumonia. Other tests of your blood, urine, or sputum may be done to find the specific cause of your pneumonia. Your caregiver may also do tests (blood gases or pulse oximetry) to see how well your lungs are working. TREATMENT  Some forms of pneumonia may be spread to other people when you cough or sneeze. You may be asked to wear a mask before and during your exam. Pneumonia that is caused by bacteria is treated with antibiotic medicine. Pneumonia that is caused by the influenza virus may be treated with an antiviral medicine. Most other viral infections must run their course. These infections will not respond to antibiotics.  PREVENTION A pneumococcal shot (vaccine) is available to prevent a common bacterial cause of pneumonia. This is usually suggested for:  People over 89 years old.   Patients on chemotherapy.   People with chronic lung problems, such as bronchitis or emphysema.   People with immune system problems.  If you are over 65 or have a high risk condition,  you may receive the pneumococcal vaccine if you have not received it before. In some countries, a routine influenza vaccine is also recommended. This vaccine can help prevent some cases of pneumonia.You may be offered the influenza vaccine as part of your care. If you smoke, it is time to quit. You may receive instructions on how to stop smoking. Your caregiver can provide medicines and counseling to help you quit. HOME CARE INSTRUCTIONS   Cough suppressants may be used if you are losing too much rest. However, coughing protects you by clearing your lungs. You should avoid using cough suppressants if you can.   Your caregiver may have prescribed medicine if he or she thinks your pneumonia is caused by a bacteria or influenza. Finish your medicine even if you start to feel better.   Your caregiver may also prescribe an expectorant. This loosens the mucus to be coughed up.   Only take over-the-counter or prescription medicines for pain, discomfort, or fever as directed by your caregiver.   Do not smoke. Smoking is a common cause of bronchitis and can contribute to pneumonia. If you are a smoker and continue to smoke, your cough may last several weeks after your pneumonia has cleared.   A cold steam vaporizer or humidifier in your room or home may help loosen mucus.   Coughing is often worse at night. Sleeping in a semi-upright position in a recliner or using a couple pillows under your head will help with this.   Get rest as you feel it is needed. Your body will usually let you know when you need to rest.  SEEK IMMEDIATE MEDICAL CARE IF:   Your illness becomes worse. This is especially true if you are elderly or weakened from any other disease.   You cannot control your cough with suppressants and are losing sleep.   You begin coughing up blood.   You develop pain which is getting worse or is uncontrolled with medicines.   You have a fever.   Any of the symptoms which initially brought  you in for treatment are getting worse rather than better.  You develop shortness of breath or chest pain.  MAKE SURE YOU:   Understand these instructions.   Will watch your condition.   Will get help right away if you are not doing well or get worse.  Document Released: 05/31/2005 Document Revised: 05/20/2011 Document Reviewed: 08/20/2010 [facility] Patient Information 2012 [place], [place].

## 2012-02-21 ENCOUNTER — Encounter: Payer: Self-pay | Admitting: *Deleted

## 2012-02-22 ENCOUNTER — Ambulatory Visit
Admission: RE | Admit: 2012-02-22 | Discharge: 2012-02-22 | Disposition: A | Discharge status: Home / Self Care | Payer: Self-pay | Source: Ambulatory Visit | Attending: Emergency Medicine | Admitting: Emergency Medicine

## 2012-02-22 DIAGNOSIS — R9389 Abnormal findings on diagnostic imaging of other specified body structures: Secondary | ICD-10-CM

## 2012-02-22 MED ORDER — IOHEXOL 300 MG/ML  SOLN
75.0000 mL | Freq: Once | INTRAMUSCULAR | Status: AC | PRN
  Administered 2012-02-22: 75 mL via INTRAVENOUS

## 2012-02-27 ENCOUNTER — Other Ambulatory Visit: Payer: Self-pay | Admitting: Radiology

## 2012-02-27 DIAGNOSIS — R935 Abnormal findings on diagnostic imaging of other abdominal regions, including retroperitoneum: Secondary | ICD-10-CM

## 2012-02-28 ENCOUNTER — Telehealth: Payer: Self-pay | Admitting: Radiology

## 2012-02-28 DIAGNOSIS — R935 Abnormal findings on diagnostic imaging of other abdominal regions, including retroperitoneum: Secondary | ICD-10-CM

## 2012-02-28 NOTE — Telephone Encounter (Signed)
Scan should be ordered with and with out contrast per [facility]. This is corrected.

## 2012-02-28 NOTE — Telephone Encounter (Signed)
Pt's son CB (on HIPPA) to get results of CT. D/W him results and recommendation for an MRI. Son stated that he had scheduled the MRI today but still has to talk w/pt to make sure that she will go d/t her being claust and doesn't like MRIs. He reports that he did let [facility] know about the problem so they will schedule an open MRI.

## 2012-03-03 ENCOUNTER — Ambulatory Visit
Admission: RE | Admit: 2012-03-03 | Discharge: 2012-03-03 | Disposition: A | Discharge status: Home / Self Care | Payer: BC Managed Care – PPO | Source: Ambulatory Visit | Attending: Emergency Medicine | Admitting: Emergency Medicine

## 2012-03-03 DIAGNOSIS — R935 Abnormal findings on diagnostic imaging of other abdominal regions, including retroperitoneum: Secondary | ICD-10-CM

## 2012-03-03 MED ORDER — GADOBENATE DIMEGLUMINE 529 MG/ML IV SOLN
13.0000 mL | Freq: Once | INTRAVENOUS | Status: AC | PRN
  Administered 2012-03-03: 13 mL via INTRAVENOUS

## 2012-07-01 ENCOUNTER — Ambulatory Visit (INDEPENDENT_AMBULATORY_CARE_PROVIDER_SITE_OTHER): Payer: BC Managed Care – PPO | Admitting: Internal Medicine

## 2012-07-01 VITALS — BP 132/77 | HR 60 | Temp 98.0°F | Resp 16 | Ht 62.5 in | Wt 143.4 lb

## 2012-07-01 DIAGNOSIS — R51 Headache: Secondary | ICD-10-CM

## 2012-07-01 DIAGNOSIS — E785 Hyperlipidemia, unspecified: Secondary | ICD-10-CM

## 2012-07-01 DIAGNOSIS — J309 Allergic rhinitis, unspecified: Secondary | ICD-10-CM

## 2012-07-01 DIAGNOSIS — F4310 Post-traumatic stress disorder, unspecified: Secondary | ICD-10-CM

## 2012-07-01 DIAGNOSIS — J329 Chronic sinusitis, unspecified: Secondary | ICD-10-CM

## 2012-07-01 DIAGNOSIS — I10 Essential (primary) hypertension: Secondary | ICD-10-CM

## 2012-07-01 DIAGNOSIS — F411 Generalized anxiety disorder: Secondary | ICD-10-CM

## 2012-07-01 DIAGNOSIS — F17200 Nicotine dependence, unspecified, uncomplicated: Secondary | ICD-10-CM

## 2012-07-01 LAB — POCT CBC
Granulocyte percent: 63.4 %G (ref 37–80)
HCT, POC: 42.9 % (ref 37.7–47.9)
Hemoglobin: 13.3 g/dL (ref 12.2–16.2)
MCV: 92 fL (ref 80–97)
Platelet Count, POC: 258 10*3/uL (ref 142–424)
RBC: 4.66 M/uL (ref 4.04–5.48)

## 2012-07-01 LAB — COMPREHENSIVE METABOLIC PANEL
AST: 19 U/L (ref 0–37)
Albumin: 4.6 g/dL (ref 3.5–5.2)
BUN: 12 mg/dL (ref 6–23)
CO2: 30 mEq/L (ref 19–32)
Calcium: 9.6 mg/dL (ref 8.4–10.5)
Chloride: 99 mEq/L (ref 96–112)
Glucose, Bld: 101 mg/dL — ABNORMAL HIGH (ref 70–99)
Potassium: 3.7 mEq/L (ref 3.5–5.3)

## 2012-07-01 LAB — LIPID PANEL
Cholesterol: 226 mg/dL — ABNORMAL HIGH (ref 0–200)
HDL: 52 mg/dL (ref >39)

## 2012-07-01 MED ORDER — FLUTICASONE PROPIONATE 50 MCG/ACT NA SUSP: 2.0000 | Freq: Every day | NASAL | Status: DC

## 2012-07-01 MED ORDER — FLUOXETINE HCL 20 MG PO TABS: 20.0000 mg | ORAL_TABLET | Freq: Every day | ORAL | Status: DC

## 2012-07-01 MED ORDER — AZITHROMYCIN 250 MG PO TABS: ORAL_TABLET | ORAL | Status: DC

## 2012-07-01 MED ORDER — HYDROCHLOROTHIAZIDE 25 MG PO TABS: 25.0000 mg | ORAL_TABLET | Freq: Every day | ORAL | Status: DC

## 2012-07-01 NOTE — Progress Notes (Signed)
Subjective:    Patient ID: [name], female    DOB: [birth year], 62 y.o.   MRN: [id number]  HPI would like to go over all of her medical problems Patient Active Problem List  Diagnosis  . HYPERCHOLESTEROLEMIA  . SMOKER  . ESSENTIAL HYPERTENSION, BENIGN  . ARTHRITIS  . CHEST PAIN  . Sinus complaint  . PTSD (post-traumatic stress disorder)   it is time for followup lab  Pt complaining of bad HA x 2 days, sinus pressure and congestion, sneezing and right ear ache No ST, cough No urinary sxs Positive smoker. Unwilling to quit.   Also has right shoulder and neck pain. Was on Cymbalta prescribed by Ortho, but they said she needed an MRI and she declined because she's claustrophobic. She does not do exercises everyday for her neck as requested  Pt is very stressed and during peak times she gets bad HA and her BP goes up. Questions if her HA could also be from her vision. Is starting to get some vision changes in her left eye. Hasn't been to the ophthmaologist in 2 years . Daughter here with her thinks that she is often overly anxious and worries excessively. There are times where she seems to have a depressed mood which affects her activity. Patient would like to try some medication for the symptoms.  Needs Med RF Pt has had a recent colonoscopy, but hasn't had a mammogram since 2010. Has had a hysterectomy.  Review of Systems  Constitutional: Negative for fever, chills, weight loss and malaise/fatigue.  HENT: Positive for neck pain. Negative for hearing loss and tinnitus.   Eyes: Negative for pain.  Respiratory: Negative for cough, shortness of breath and wheezing.   Cardiovascular: Negative for chest pain, palpitations and leg swelling.  Gastrointestinal: Negative for heartburn, abdominal pain and constipation.  Genitourinary: Negative for dysuria and urgency.  Musculoskeletal: Positive for joint pain.  Skin: Negative for rash.  Neurological: Negative for tremors, sensory change,  speech change and weakness.  Hematological: Does not bruise/bleed easily.  Psychiatric/Behavioral: Negative for memory loss.   Review of Systems  Constitutional: Negative for fever, chills, weight loss and malaise/fatigue.  HENT: Positive for neck pain. Negative for hearing loss and tinnitus.   Eyes: Negative for pain.  Respiratory: Negative for cough, shortness of breath and wheezing.   Cardiovascular: Negative for chest pain, palpitations and leg swelling.  Gastrointestinal: Negative for heartburn, abdominal pain and constipation.  Genitourinary: Negative for dysuria and urgency.  Musculoskeletal: Positive for joint pain.  Skin: Negative for rash.  Neurological: Negative for tremors, sensory change, speech change and weakness.  Endo/Heme/Allergies: Does not bruise/bleed easily.  Psychiatric/Behavioral: Negative for memory loss.       Objective:   Physical Exam  Constitutional: She is oriented to person, place, and time. She appears well-developed and well-nourished.  HENT:  Head: Normocephalic.  Right Ear: External ear normal.  Left Ear: External ear normal.  Mouth/Throat: Oropharynx is clear and moist.       TMs clear Nares boggy with purulence  Eyes: Conjunctivae normal are normal. Pupils are equal, round, and reactive to light.  Neck: No thyromegaly present.       Neck is tender in the paracervical muscles with decreased range of motion due to pain  Cardiovascular: Normal rate, regular rhythm and normal heart sounds.   No murmur heard. Pulmonary/Chest: Effort normal and breath sounds normal.  Abdominal: Soft. Bowel sounds are normal. She exhibits no mass. There is no tenderness.  Musculoskeletal: She  exhibits no edema.  Lymphadenopathy:    She has no cervical adenopathy.  Neurological: She is alert and oriented to person, place, and time. She has normal reflexes. No cranial nerve deficit. Coordination normal.  Skin: No rash noted.  Psychiatric: She has a normal mood and  affect. Her behavior is normal. Judgment and thought content normal.  Dentures upper   50 minute office visit    Assessment & Plan:   1. Hyperlipemia  Comprehensive metabolic panel, Lipid panel  2. Hypertension  Comprehensive metabolic panel, Lipid panel, hydrochlorothiazide (HYDRODIURIL) 25 MG tablet  3. Sinusitis  POCT CBC  4. Headache    5. PTSD (post-traumatic stress disorder)    6. GAD (generalized anxiety disorder)    7. Nicotine addiction    8. AR (allergic rhinitis)     Meds ordered this encounter  Medications  . fluticasone (FLONASE) 50 MCG/ACT nasal spray    Sig: Place 2 sprays into the nose daily.    Dispense:  16 g    Refill:  11  . azithromycin (ZITHROMAX) 250 MG tablet    Sig: As directed    Dispense:  6 tablet    Refill:  0  . FLUoxetine (PROZAC) 20 MG tablet    Sig: Take 1 tablet (20 mg total) by mouth daily. At bedtime    Dispense:  30 tablet    Refill:  3  . hydrochlorothiazide (HYDRODIURIL) 25 MG tablet    Sig: Take 1 tablet (25 mg total) by mouth daily.    Dispense:  90 tablet    Refill:  3   Followup to assess Prozac in 4 weeks offered physical therapy but she prefers to do home exercises which we have given another copy of Discuss smoking cessation/gradual reduction Recheck headaches in 4 weeks

## 2012-07-03 ENCOUNTER — Encounter: Payer: Self-pay | Admitting: Internal Medicine

## 2012-07-28 ENCOUNTER — Other Ambulatory Visit: Payer: Self-pay | Admitting: Emergency Medicine

## 2012-12-30 ENCOUNTER — Telehealth: Payer: Self-pay

## 2012-12-30 NOTE — Telephone Encounter (Signed)
Patient would like refill of BP medication.

## 2012-12-31 NOTE — Telephone Encounter (Signed)
Notified pt that she should have enough to last until sep

## 2013-08-28 ENCOUNTER — Ambulatory Visit (INDEPENDENT_AMBULATORY_CARE_PROVIDER_SITE_OTHER): Payer: BC Managed Care – PPO | Admitting: Family Medicine

## 2013-08-28 VITALS — BP 126/78 | HR 77 | Temp 98.4°F | Resp 16 | Ht 63.26 in | Wt 132.0 lb

## 2013-08-28 DIAGNOSIS — K219 Gastro-esophageal reflux disease without esophagitis: Secondary | ICD-10-CM

## 2013-08-28 DIAGNOSIS — L0390 Cellulitis, unspecified: Secondary | ICD-10-CM

## 2013-08-28 DIAGNOSIS — L0291 Cutaneous abscess, unspecified: Secondary | ICD-10-CM

## 2013-08-28 MED ORDER — MUPIROCIN CALCIUM 2 % EX CREA: 1.0000 "application " | TOPICAL_CREAM | Freq: Two times a day (BID) | CUTANEOUS | Status: DC

## 2013-08-28 MED ORDER — PANTOPRAZOLE SODIUM 40 MG PO TBEC: 40.0000 mg | DELAYED_RELEASE_TABLET | Freq: Every day | ORAL | Status: DC

## 2013-08-28 NOTE — Patient Instructions (Signed)
Gastroesophageal Reflux Disease, Adult  Gastroesophageal reflux disease (GERD) happens when acid from your stomach flows up into the esophagus. When acid comes in contact with the esophagus, the acid causes soreness (inflammation) in the esophagus. Over time, GERD may create small holes (ulcers) in the lining of the esophagus.  CAUSES   · Increased body weight. This puts pressure on the stomach, making acid rise from the stomach into the esophagus.  · Smoking. This increases acid production in the stomach.  · Drinking alcohol. This causes decreased pressure in the lower esophageal sphincter (valve or ring of muscle between the esophagus and stomach), allowing acid from the stomach into the esophagus.  · Late evening meals and a full stomach. This increases pressure and acid production in the stomach.  · A malformed lower esophageal sphincter.  Sometimes, no cause is found.  SYMPTOMS   · Burning pain in the lower part of the mid-chest behind the breastbone and in the mid-stomach area. This may occur twice a week or more often.  · Trouble swallowing.  · Sore throat.  · Dry cough.  · Asthma-like symptoms including chest tightness, shortness of breath, or wheezing.  DIAGNOSIS   Your caregiver may be able to diagnose GERD based on your symptoms. In some cases, X-rays and other tests may be done to check for complications or to check the condition of your stomach and esophagus.  TREATMENT   Your caregiver may recommend over-the-counter or prescription medicines to help decrease acid production. Ask your caregiver before starting or adding any new medicines.   HOME CARE INSTRUCTIONS   · Change the factors that you can control. Ask your caregiver for guidance concerning weight loss, quitting smoking, and alcohol consumption.  · Avoid foods and drinks that make your symptoms worse, such as:  · Caffeine or alcoholic drinks.  · Chocolate.  · Peppermint or mint flavorings.  · Garlic and onions.  · Spicy foods.  · Citrus fruits,  such as oranges, lemons, or limes.  · Tomato-based foods such as sauce, chili, salsa, and pizza.  · Fried and fatty foods.  · Avoid lying down for the 3 hours prior to your bedtime or prior to taking a nap.  · Eat small, frequent meals instead of large meals.  · Wear loose-fitting clothing. Do not wear anything tight around your waist that causes pressure on your stomach.  · Raise the head of your bed 6 to 8 inches with wood blocks to help you sleep. Extra pillows will not help.  · Only take over-the-counter or prescription medicines for pain, discomfort, or fever as directed by your caregiver.  · Do not take aspirin, ibuprofen, or other nonsteroidal anti-inflammatory drugs (NSAIDs).  SEEK IMMEDIATE MEDICAL CARE IF:   · You have pain in your arms, neck, jaw, teeth, or back.  · Your pain increases or changes in intensity or duration.  · You develop nausea, vomiting, or sweating (diaphoresis).  · You develop shortness of breath, or you faint.  · Your vomit is green, yellow, black, or looks like coffee grounds or blood.  · Your stool is red, bloody, or black.  These symptoms could be signs of other problems, such as heart disease, gastric bleeding, or esophageal bleeding.  MAKE SURE YOU:   · Understand these instructions.  · Will watch your condition.  · Will get help right away if you are not doing well or get worse.  Document Released: 03/10/2005 Document Revised: 08/23/2011 Document Reviewed: 12/18/2010  [employer]® Patient   Information ©2014 [employer].

## 2013-08-28 NOTE — Progress Notes (Signed)
Subjective:    Patient ID: [name], female    DOB: 1950/06/23, 63 y.o.   MRN: [id number] This chart was scribed for Robyn Haber, MD by Anastasia Pall, ED Scribe. This patient was seen in room 13 and the patient's care was started at 5:07 PM.  Chief Complaint  Patient presents with   Nausea    X last night   Emesis    Just last night   HPI [name] is a 63 y.o. female Pt presents to the [facility] with constant nausea, onset last night, with associated vomiting last night and this morning. She states she rarely vomits in general. She denies abdominal pain and diarrhea. Her son reports she has been under a lot of stress recently due to issues with her daughter-in-law leaving the country. She has a small abrasion over her left eyebrow, onset a few days ago. She denies being hit on the head. She denies fever, and any other symptoms.   PCP - No primary provider on file.  Patient Active Problem List   Diagnosis Date Noted   Sinus complaint 07/18/2011   PTSD (post-traumatic stress disorder) 07/18/2011   SMOKER 03/30/2010   ESSENTIAL HYPERTENSION, BENIGN 03/30/2010   HYPERCHOLESTEROLEMIA 03/27/2010   ARTHRITIS 03/27/2010   CHEST PAIN 03/27/2010   Prior to Admission medications   Medication Sig Start Date End Date Taking? Authorizing Provider  atorvastatin (LIPITOR) 40 MG tablet TAKE 1 TABLET BY MOUTH EVERY DAY 07/28/12   Rise Mu, PA-C  azithromycin (ZITHROMAX) 250 MG tablet As directed 07/01/12   Leandrew Koyanagi, MD  FLUoxetine (PROZAC) 20 MG tablet Take 1 tablet (20 mg total) by mouth daily. At bedtime 07/01/12   Leandrew Koyanagi, MD  fluticasone [facility]) 50 MCG/ACT nasal spray Place 2 sprays into the nose daily. 07/01/12   Leandrew Koyanagi, MD  hydrochlorothiazide (HYDRODIURIL) 25 MG tablet Take 1 tablet (25 mg total) by mouth daily. 07/01/12 07/01/13  Leandrew Koyanagi, MD   Review of Systems  Constitutional: Negative for fever.  Gastrointestinal: Positive for  nausea and vomiting. Negative for abdominal pain and diarrhea.  Skin: Positive for wound (small abrasion over left eyebrow).  Psychiatric/Behavioral:       + for recent stress due to family issues   BP 126/78   Pulse 77   Temp(Src) 98.4 F (36.9 C) (Oral)   Resp 16   Ht 5' 3.26" (1.607 m)   Wt 132 lb (59.875 kg)   BMI 23.19 kg/m2   SpO2 96%    Objective:   Physical Exam Nursing note and vitals reviewed. Constitutional: Pt is oriented to person, place, and time. Pt appears well-developed and well-nourished. No distress.  HENT: Right and left external ear normal.   Head: Normocephalic and atraumatic.  Eyes: EOM are normal. Neck: Neck supple. Cardiovascular: Normal rate, regular rhythm and normal heart sounds.  Exam reveals no gallop and no friction rub. No murmur heard. Pulmonary/Chest: Effort normal and breath sounds normal. No respiratory distress. Pt has no wheezes. Pt has no rales.  Abdominal: Soft. Bowel sounds are normal. There is mild epigastric tenderness. There is no rebound and no guarding. No hepatosplenomegaly. No CVA tenderness.  Musculoskeletal: Normal range of motion. No tenderness. No edema.   Neurological: Pt is alert and oriented to person, place, and time.  Skin: Skin is warm and dry.  Psychiatric: Pt has a normal mood and affect. Pt's behavior is normal.  Abrasion left lateral eyebrow with some vesiculation  Assessment & Plan:  Cellulitis - Plan: mupirocin cream (BACTROBAN) 2 %  GERD (gastroesophageal reflux disease) - Plan: pantoprazole (PROTONIX) 40 MG tablet  Signed, Robyn Haber, MD

## 2013-11-25 ENCOUNTER — Ambulatory Visit (INDEPENDENT_AMBULATORY_CARE_PROVIDER_SITE_OTHER): Payer: BC Managed Care – PPO

## 2013-11-25 ENCOUNTER — Ambulatory Visit (INDEPENDENT_AMBULATORY_CARE_PROVIDER_SITE_OTHER): Payer: BC Managed Care – PPO | Admitting: Family Medicine

## 2013-11-25 VITALS — BP 132/74 | HR 67 | Temp 98.0°F | Resp 16 | Ht 62.0 in | Wt 126.5 lb

## 2013-11-25 DIAGNOSIS — E785 Hyperlipidemia, unspecified: Secondary | ICD-10-CM

## 2013-11-25 DIAGNOSIS — R519 Headache, unspecified: Secondary | ICD-10-CM

## 2013-11-25 DIAGNOSIS — Z1231 Encounter for screening mammogram for malignant neoplasm of breast: Secondary | ICD-10-CM

## 2013-11-25 DIAGNOSIS — R51 Headache: Secondary | ICD-10-CM

## 2013-11-25 DIAGNOSIS — M542 Cervicalgia: Secondary | ICD-10-CM

## 2013-11-25 DIAGNOSIS — Z0000 Encounter for general adult medical examination without abnormal findings: Secondary | ICD-10-CM

## 2013-11-25 DIAGNOSIS — R42 Dizziness and giddiness: Secondary | ICD-10-CM

## 2013-11-25 DIAGNOSIS — R319 Hematuria, unspecified: Secondary | ICD-10-CM

## 2013-11-25 DIAGNOSIS — I10 Essential (primary) hypertension: Secondary | ICD-10-CM

## 2013-11-25 LAB — POCT CBC
Granulocyte percent: 68.9 % (ref 37–80)
HCT, POC: 39.2 % (ref 37.7–47.9)
Hemoglobin: 12.7 g/dL (ref 12.2–16.2)
Lymph, poc: 2.1 (ref 0.6–3.4)
MCH, POC: 29.7 pg (ref 27–31.2)
MCHC: 32.4 g/dL (ref 31.8–35.4)
MCV: 91.9 fL (ref 80–97)
MID (cbc): 0.4 (ref 0–0.9)
MPV: 8.6 fL (ref 0–99.8)
POC Granulocyte: 5.6 (ref 2–6.9)
POC LYMPH PERCENT: 25.7 % (ref 10–50)
POC MID %: 5.4 %M (ref 0–12)
Platelet Count, POC: 227 10*3/uL (ref 142–424)
RBC: 4.27 M/uL (ref 4.04–5.48)
RDW, POC: 14.8 %
WBC: 8.1 10*3/uL (ref 4.6–10.2)

## 2013-11-25 LAB — POCT URINALYSIS DIPSTICK
Bilirubin, UA: NEGATIVE
Glucose, UA: NEGATIVE
Ketones, UA: NEGATIVE
Leukocytes, UA: NEGATIVE
Nitrite, UA: NEGATIVE
Protein, UA: NEGATIVE
Spec Grav, UA: <=1.005
Urobilinogen, UA: 0.2
pH, UA: 5

## 2013-11-25 LAB — COMPLETE METABOLIC PANEL WITH GFR
ALT: 15 U/L (ref 0–35)
AST: 14 U/L (ref 0–37)
Creat: 0.65 mg/dL (ref 0.50–1.10)
Total Bilirubin: 0.4 mg/dL (ref 0.2–1.2)

## 2013-11-25 LAB — COMPLETE METABOLIC PANEL WITHOUT GFR
Albumin: 4.5 g/dL (ref 3.5–5.2)
Alkaline Phosphatase: 39 U/L (ref 39–117)
BUN: 12 mg/dL (ref 6–23)
CO2: 28 meq/L (ref 19–32)
Calcium: 9.3 mg/dL (ref 8.4–10.5)
Chloride: 106 meq/L (ref 96–112)
GFR, Est African American: >89 mL/min
GFR, Est Non African American: >89 mL/min
Glucose, Bld: 98 mg/dL (ref 70–99)
Potassium: 4.2 meq/L (ref 3.5–5.3)
Sodium: 140 meq/L (ref 135–145)
Total Protein: 6.6 g/dL (ref 6.0–8.3)

## 2013-11-25 LAB — LIPID PANEL
Cholesterol: 292 mg/dL — ABNORMAL HIGH (ref 0–200)
HDL: 65 mg/dL (ref >39)
LDL Cholesterol: 204 mg/dL — ABNORMAL HIGH (ref 0–99)
Total CHOL/HDL Ratio: 4.5 Ratio
Triglycerides: 117 mg/dL (ref <150)
VLDL: 23 mg/dL (ref 0–40)

## 2013-11-25 LAB — POCT UA - MICROSCOPIC ONLY
Casts, Ur, LPF, POC: NEGATIVE
Crystals, Ur, HPF, POC: NEGATIVE
Mucus, UA: NEGATIVE
Yeast, UA: NEGATIVE

## 2013-11-25 LAB — TSH: TSH: 1.63 u[IU]/mL (ref 0.350–4.500)

## 2013-11-25 LAB — MICROALBUMIN, URINE: Microalb, Ur: 0.5 mg/dL (ref 0.00–1.89)

## 2013-11-25 MED ORDER — HYDROCHLOROTHIAZIDE 25 MG PO TABS: 25.0000 mg | ORAL_TABLET | Freq: Every day | ORAL | Status: DC

## 2013-11-25 MED ORDER — ATORVASTATIN CALCIUM 40 MG PO TABS: 40.0000 mg | ORAL_TABLET | Freq: Every day | ORAL | Status: DC

## 2013-11-25 MED ORDER — BACLOFEN 10 MG PO TABS: 10.0000 mg | ORAL_TABLET | Freq: Every evening | ORAL | Status: DC | PRN

## 2013-11-25 NOTE — Progress Notes (Signed)
 Chief Complaint: annual visit  HPI: [name] is a  63 y.o. [place] female who is here for annual exam  Mammogram overdue Last colonscopy 2012---normal, does not need to come back for another 10 years.  Does not remember when she had her last CPE She has HTN, XOL and is a smoker She has been out of meds for several months, 3-4 months.  Her BP runs about 140-170s 2  years ago was 130 and then she had labile BP No CP, SOB, palpitations + dizziness usually when she is standing. When she stands for 10 min or more. Dizziness lasst for  A few seconds, about 3 times per month. She eats and drinks normally.  Occ has some left sided pain behind left eye, no diplopia, occ spots, but no vision loss Has occ HAs 2-3 times per month, she has pain in her neck and then it radiates to the top of her head.  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    Past Medical History  Diagnosis Date  . Depression   . Arthritis   . Hypertension   . Hyperlipidemia    Past Surgical History  Procedure Laterality Date  . Abdominal hysterectomy      2009 , due to fobroids, benign path for uterus, tumor and also cervix.    History   Social History  . Marital Status: Married    Spouse Name: N/A    Number of Children: N/A  . Years of Education: N/A   Social History Main Topics  . Smoking status: Current Every Day Smoker -- 1.00 packs/day for 30 years    Types: Cigarettes  . Smokeless tobacco: None  . Alcohol Use: No  . Drug Use: No  . Sexual Activity: None   Other Topics Concern  . None   Social History Narrative  . None   History reviewed. No pertinent family history. Allergies  Allergen Reactions  . Penicillins    Prior to Admission medications   Medication Sig Start Date End Date Taking? Authorizing Provider  atorvastatin (LIPITOR) 40 MG tablet TAKE 1 TABLET BY MOUTH EVERY DAY 07/28/12   Areta Haber Dunn, PA-C    FLUoxetine (PROZAC) 20 MG tablet Take 1 tablet (20 mg total) by mouth daily. At bedtime 07/01/12   Leandrew Koyanagi, MD  fluticasone [facility]) 50 MCG/ACT nasal spray Place 2 sprays into the nose daily. 07/01/12   Leandrew Koyanagi, MD  hydrochlorothiazide (HYDRODIURIL) 25 MG tablet Take 1 tablet (25 mg total) by mouth daily. 07/01/12 07/01/13  Leandrew Koyanagi, MD  mupirocin cream (BACTROBAN) 2 % Apply 1 application topically 2 (two) times daily. 08/28/13   Robyn Haber, MD  pantoprazole (PROTONIX) 40 MG tablet Take 1 tablet (40 mg total) by mouth daily. 08/28/13   Robyn Haber, MD     ROS: The patient denies fevers, chills, night sweats, unintentional weight loss, chest pain, palpitations, wheezing, dyspnea on exertion, nausea, vomiting, abdominal pain, dysuria, hematuria, melena, numbness, weakness, or tingling.   All other systems have been reviewed and were otherwise negative with the exception of those mentioned in the HPI and as above.    PHYSICAL EXAM: Filed Vitals:   11/25/13 0810  BP: 132/74  Pulse: 67  Temp: 98 F (36.7 C)  Resp: 16   Filed Vitals:   11/25/13 0810  Height: 5\' 2"  (1.575 m)  Weight: 126 lb 8 oz (  57.38 kg)   Body mass index is 23.13 kg/(m^2).  General: Alert, no acute distress HEENT:  Normocephalic, atraumatic, oropharynx patent. EOMI, PERRLA, fundoscopic exam nl Cardiovascular:  Regular rate and rhythm, no rubs murmurs or gallops.  No Carotid bruits, radial pulse intact. No pedal edema.  Respiratory: Clear to auscultation bilaterally.  No wheezes, rales, or rhonchi.  No cyanosis, no use of accessory musculature GI: No organomegaly, abdomen is soft and non-tender, positive bowel sounds.  No masses. Skin: No rashes. Neurologic: Facial musculature symmetric. Psychiatric: Patient is appropriate throughout our interaction. Lymphatic: No cervical lymphadenopathy Musculoskeletal: Gait intact. Breast exam normal Gu-vaginal vault nl, no  cervix  LABS: Results for orders placed in visit on 11/25/13  POCT CBC      Result Value Ref Range   WBC 8.1  4.6 - 10.2 K/uL   Lymph, poc 2.1  0.6 - 3.4   POC LYMPH PERCENT 25.7  10 - 50 %L   MID (cbc) 0.4  0 - 0.9   POC MID % 5.4  0 - 12 %M   POC Granulocyte 5.6  2 - 6.9   Granulocyte percent 68.9  37 - 80 %G   RBC 4.27  4.04 - 5.48 M/uL   Hemoglobin 12.7  12.2 - 16.2 g/dL   HCT, POC 39.2  37.7 - 47.9 %   MCV 91.9  80 - 97 fL   MCH, POC 29.7  27 - 31.2 pg   MCHC 32.4  31.8 - 35.4 g/dL   RDW, POC 14.8     Platelet Count, POC 227  142 - 424 K/uL   MPV 8.6  0 - 99.8 fL  POCT UA - MICROSCOPIC ONLY      Result Value Ref Range   WBC, Ur, HPF, POC 1-2     RBC, urine, microscopic 1-4     Bacteria, U Microscopic trace     Mucus, UA neg     Epithelial cells, urine per micros 1-2     Crystals, Ur, HPF, POC neg     Casts, Ur, LPF, POC neg     Yeast, UA neg    POCT URINALYSIS DIPSTICK      Result Value Ref Range   Color, UA yellow     Clarity, UA clear     Glucose, UA neg     Bilirubin, UA neg     Ketones, UA neg     Spec Grav, UA <=1.005     Blood, UA small     pH, UA 5.0     Protein, UA neg     Urobilinogen, UA 0.2     Nitrite, UA neg     Leukocytes, UA Negative       EKG/XRAY:   Primary read interpreted by Dr. Marin Comment at [facility]. Significant DJD, no fx or dislocation   ASSESSMENT/PLAN: Encounter Diagnoses  Name Primary?  . Annual physical exam Yes  . Visit for screening mammogram   . HTN (hypertension)   . Other and unspecified hyperlipidemia   . Dizziness and giddiness   . Neck pain   . Generalized headaches   . Hypertension   . Hematuria    F/u in 1 month for hematuria since she is a smoker, if still present on recheck then needs to get referral to urology Refilled meds for 1 year Advise to stop smoking, stay compliant with meds Get eyes examine by optometry or optho for spots She does have generalized HAs may be just tension occipital  HAs from significant DJD  of spine,  or from elevated BP but she does not measure it when she has HA F/u in 1 month Annual labs and screening  tests pending She is UTD on vaccines    Gross sideeffects, risk and benefits, and alternatives of medications d/w patient. Patient is aware that all medications have potential sideeffects and we are unable to predict every sideeffect or drug-drug interaction that may occur.  , [place], DO 11/25/2013 9:46 AM

## 2013-12-05 ENCOUNTER — Ambulatory Visit
Admission: RE | Admit: 2013-12-05 | Discharge: 2013-12-05 | Disposition: A | Discharge status: Home / Self Care | Payer: BC Managed Care – PPO | Source: Ambulatory Visit | Attending: Family Medicine | Admitting: Family Medicine

## 2013-12-05 ENCOUNTER — Encounter: Payer: Self-pay | Admitting: Family Medicine

## 2013-12-05 DIAGNOSIS — Z1231 Encounter for screening mammogram for malignant neoplasm of breast: Secondary | ICD-10-CM

## 2014-12-11 ENCOUNTER — Ambulatory Visit (INDEPENDENT_AMBULATORY_CARE_PROVIDER_SITE_OTHER): Payer: BLUE CROSS/BLUE SHIELD | Admitting: Family Medicine

## 2014-12-11 VITALS — BP 128/80 | HR 83 | Temp 98.0°F | Resp 17 | Ht 62.0 in | Wt 134.0 lb

## 2014-12-11 DIAGNOSIS — E7800 Pure hypercholesterolemia, unspecified: Secondary | ICD-10-CM

## 2014-12-11 DIAGNOSIS — Z720 Tobacco use: Secondary | ICD-10-CM | POA: Diagnosis not present

## 2014-12-11 DIAGNOSIS — I10 Essential (primary) hypertension: Secondary | ICD-10-CM | POA: Diagnosis not present

## 2014-12-11 DIAGNOSIS — Z0000 Encounter for general adult medical examination without abnormal findings: Secondary | ICD-10-CM

## 2014-12-11 DIAGNOSIS — Z1239 Encounter for other screening for malignant neoplasm of breast: Secondary | ICD-10-CM | POA: Diagnosis not present

## 2014-12-11 DIAGNOSIS — F17200 Nicotine dependence, unspecified, uncomplicated: Secondary | ICD-10-CM

## 2014-12-11 DIAGNOSIS — R319 Hematuria, unspecified: Secondary | ICD-10-CM

## 2014-12-11 LAB — COMPREHENSIVE METABOLIC PANEL
ALBUMIN: 4.3 g/dL (ref 3.5–5.2)
ALK PHOS: 48 U/L (ref 39–117)
ALT: 20 U/L (ref 0–35)
AST: 18 U/L (ref 0–37)
BILIRUBIN TOTAL: 0.5 mg/dL (ref 0.2–1.2)
BUN: 14 mg/dL (ref 6–23)
CO2: 29 mEq/L (ref 19–32)
Calcium: 9.6 mg/dL (ref 8.4–10.5)
Chloride: 97 mEq/L (ref 96–112)
Creat: 0.6 mg/dL (ref 0.50–1.10)
GLUCOSE: 105 mg/dL — AB (ref 70–99)
POTASSIUM: 3.5 meq/L (ref 3.5–5.3)
SODIUM: 138 meq/L (ref 135–145)
Total Protein: 6.7 g/dL (ref 6.0–8.3)

## 2014-12-11 LAB — POCT UA - MICROSCOPIC ONLY
CRYSTALS, UR, HPF, POC: NEGATIVE
Casts, Ur, LPF, POC: NEGATIVE
MUCUS UA: NEGATIVE
Yeast, UA: NEGATIVE

## 2014-12-11 LAB — CBC
HCT: 41.2 % (ref 36.0–46.0)
Hemoglobin: 14.4 g/dL (ref 12.0–15.0)
MCH: 29.6 pg (ref 26.0–34.0)
MCHC: 35 g/dL (ref 30.0–36.0)
MCV: 84.8 fL (ref 78.0–100.0)
MPV: 9.2 fL (ref 8.6–12.4)
PLATELETS: 235 10*3/uL (ref 150–400)
RBC: 4.86 MIL/uL (ref 3.87–5.11)
RDW: 13.9 % (ref 11.5–15.5)
WBC: 10.7 10*3/uL — AB (ref 4.0–10.5)

## 2014-12-11 LAB — POCT URINALYSIS DIPSTICK
Bilirubin, UA: NEGATIVE
GLUCOSE UA: NEGATIVE
KETONES UA: NEGATIVE
NITRITE UA: NEGATIVE
PH UA: 7
PROTEIN UA: NEGATIVE
SPEC GRAV UA: 1.015
UROBILINOGEN UA: 0.2

## 2014-12-11 LAB — LIPID PANEL
CHOL/HDL RATIO: 3 ratio
CHOLESTEROL: 208 mg/dL — AB (ref 0–200)
HDL: 69 mg/dL (ref >=46)
LDL CALC: 119 mg/dL — AB (ref 0–99)
Triglycerides: 100 mg/dL (ref <150)
VLDL: 20 mg/dL (ref 0–40)

## 2014-12-11 LAB — MICROALBUMIN, URINE: Microalb, Ur: 0.5 mg/dL (ref <2.0)

## 2014-12-11 NOTE — Progress Notes (Signed)
Subjective:    Patient ID: [name], female    DOB: Dec 31, 1950, 64 y.o.   MRN: [id number]  HPI This is a 64 yo female who presents for CPE. She is accompanied by her son. She is from [place] and her son is helping her a little with translation.   Last CPE- 11/2013 Mammo- 12/05/13 Pap- total hysterectomy Colonoscopy- 2012, 10 year recall Tdap- declines Flu-declines Eye- last year Dental- wears dentures Exercise- Cleans offices full time  The patient is married and her son and two year old grandson live with her and her husband. She is active in a church community. She likes to watch soap operas for relaxation.   Past Medical History  Diagnosis Date  . Depression   . Arthritis   . Hypertension   . Hyperlipidemia    Past Surgical History  Procedure Laterality Date  . Abdominal hysterectomy      2009 , due to fobroids, benign path for uterus, tumor and also cervix.    No family history on file. History  Substance Use Topics  . Smoking status: Current Every Day Smoker -- 1.00 packs/day for 37 years    Types: Cigarettes  . Smokeless tobacco: Not on file  . Alcohol Use: No  Medications, allergies, past medical history, surgical history, family history, social history and problem list reviewed and updated.    Review of Systems  Constitutional: Negative.   HENT: Positive for rhinorrhea (when she is cold). Negative for ear pain (Intermittent right ear pain for 1 year, no decreased hearing. Does not take anything for pain.).   Eyes: Negative.   Respiratory: Negative.   Cardiovascular: Negative.   Gastrointestinal: Negative.   Endocrine: Negative.   Genitourinary: Negative.   Musculoskeletal: Positive for back pain (low back pain for 3-4 days. feels achy on lower right side. Improved with heat. No radiation/numbness tingling/weakness. Has not taken any OTC medication for pain. Sleeps well except when 40 year old grandson sleeps with her. ).  Allergic/Immunologic: Negative.    Neurological: Positive for headaches (infrequent.).  Hematological: Negative.   Psychiatric/Behavioral: Negative for dysphoric mood. The patient is not nervous/anxious.       Objective:   Physical Exam Physical Exam  Constitutional: She is oriented to person, place, and time. She appears well-developed and well-nourished. No distress.  HENT:  Head: Normocephalic and atraumatic.  Right Ear: External ear normal.  Left Ear: External ear normal.  Nose: Nose normal.  Mouth/Throat: Oropharynx is clear and moist. No oropharyngeal exudate.  Eyes: Conjunctivae are normal. Pupils are equal, round, and reactive to light.  Neck: Normal range of motion. Neck supple. No JVD present. No thyromegaly present.  Cardiovascular: Normal rate, regular rhythm, normal heart sounds and intact distal pulses.   Pulmonary/Chest: Effort normal and breath sounds normal. Right breast exhibits no inverted nipple, no mass, no nipple discharge, no skin change and no tenderness. Left breast exhibits no inverted nipple, no mass, no nipple discharge, no skin change and no tenderness. Breasts are symmetrical.  Abdominal: Soft. Bowel sounds are normal. She exhibits no distension and no mass. There is no tenderness. There is no rebound and no guarding.  Genitourinary: Vagina normal. Pelvic exam was performed with patient supine. There is no rash, tenderness, lesion or injury on the right labia. There is no rash, tenderness, lesion or injury on the left labia. Cervix exhibits no motion tenderness and no discharge. No vaginal discharge found.  Musculoskeletal: Normal range of motion. She exhibits no edema or tenderness.  Lymphadenopathy:    She has no cervical adenopathy.  Neurological: She is alert and oriented to person, place, and time.  Skin: Skin is warm and dry. She is not diaphoretic.  Psychiatric: She has a normal mood and affect. Her behavior is normal. Judgment and thought content normal.  Vitals reviewed.  BP 128/80  mmHg  Pulse 83  Temp(Src) 98 F (36.7 C) (Oral)  Resp 17  Ht '5\' 2"'$  (1.575 m)  Wt 134 lb (60.782 kg)  BMI 24.50 kg/m2  SpO2 97% Wt Readings from Last 3 Encounters:  12/11/14 134 lb (60.782 kg)  11/25/13 126 lb 8 oz (57.38 kg)  08/28/13 132 lb (59.875 kg)      Assessment & Plan:  1. Annual physical exam   2. SMOKER - Encouraged her to pick quit date and provided written tips for success.  - CBC - Comprehensive metabolic panel  3. HYPERCHOLESTEROLEMIA - Comprehensive metabolic panel - Lipid panel  4. Essential hypertension - CBC - Comprehensive metabolic panel - Lipid panel - Microalbumin, urine  5. Screening for breast cancer - MM DIGITAL SCREENING BILATERAL; Future  6. Hematuria - This was noted last year and patient did not follow up. Discussed importance of follow up if positive today due to smoking history and risk of bladder cancer.  - POCT urinalysis dipstick - POCT UA - Microscopic Only  - follow up in 6 months Clarene Reamer, FNP-BC  [facility]  12/11/2014 9:11 AM

## 2014-12-11 NOTE — Patient Instructions (Signed)
#1 Have some fun, get some fresh air every day Pick a stop smoking date Try some Salon Pas (over the counter ointment) for your shoulder/neck Can take acetaminophen (tylenol) 2-3 times a day for neck pain, back pain, headache Ok to take Alleve, don't take more than a couple of times a month because it can raise your blood pressure  Smoking Cessation, Tips for Success If you are ready to quit smoking, congratulations! You have chosen to help yourself be healthier. Cigarettes bring nicotine, tar, carbon monoxide, and other irritants into your body. Your lungs, heart, and blood vessels will be able to work better without these poisons. There are many different ways to quit smoking. Nicotine gum, nicotine patches, a nicotine inhaler, or nicotine nasal spray can help with physical craving. Hypnosis, support groups, and medicines help break the habit of smoking. WHAT THINGS CAN I DO TO MAKE QUITTING EASIER?  Here are some tips to help you quit for good:  Pick a date when you will quit smoking completely. Tell all of your friends and family about your plan to quit on that date.  Do not try to slowly cut down on the number of cigarettes you are smoking. Pick a quit date and quit smoking completely starting on that day.  Throw away all cigarettes.   Clean and remove all ashtrays from your home, work, and car.  On a card, write down your reasons for quitting. Carry the card with you and read it when you get the urge to smoke.  Cleanse your body of nicotine. Drink enough water and fluids to keep your urine clear or pale yellow. Do this after quitting to flush the nicotine from your body.  Learn to predict your moods. Do not let a bad situation be your excuse to have a cigarette. Some situations in your life might tempt you into wanting a cigarette.  Never have "just one" cigarette. It leads to wanting another and another. Remind yourself of your decision to quit.  Change habits associated with  smoking. If you smoked while driving or when feeling stressed, try other activities to replace smoking. Stand up when drinking your coffee. Brush your teeth after eating. Sit in a different chair when you read the paper. Avoid alcohol while trying to quit, and try to drink fewer caffeinated beverages. Alcohol and caffeine may urge you to smoke.  Avoid foods and drinks that can trigger a desire to smoke, such as sugary or spicy foods and alcohol.  Ask people who smoke not to smoke around you.  Have something planned to do right after eating or having a cup of coffee. For example, plan to take a walk or exercise.  Try a relaxation exercise to calm you down and decrease your stress. Remember, you may be tense and nervous for the first 2 weeks after you quit, but this will pass.  Find new activities to keep your hands busy. Play with a pen, coin, or rubber band. Doodle or draw things on paper.  Brush your teeth right after eating. This will help cut down on the craving for the taste of tobacco after meals. You can also try mouthwash.   Use oral substitutes in place of cigarettes. Try using lemon drops, carrots, cinnamon sticks, or chewing gum. Keep them handy so they are available when you have the urge to smoke.  When you have the urge to smoke, try deep breathing.  Designate your home as a nonsmoking area.  If you are a heavy  smoker, ask your health care provider about a prescription for nicotine chewing gum. It can ease your withdrawal from nicotine.  Reward yourself. Set aside the cigarette money you save and buy yourself something nice.  Look for support from others. Join a support group or smoking cessation program. Ask someone at home or at work to help you with your plan to quit smoking.  Always ask yourself, "Do I need this cigarette or is this just a reflex?" Tell yourself, "Today, I choose not to smoke," or "I do not want to smoke." You are reminding yourself of your decision to  quit.  Do not replace cigarette smoking with electronic cigarettes (commonly called e-cigarettes). The safety of e-cigarettes is unknown, and some may contain harmful chemicals.  If you relapse, do not give up! Plan ahead and think about what you will do the next time you get the urge to smoke. HOW WILL I FEEL WHEN I QUIT SMOKING? You may have symptoms of withdrawal because your body is used to nicotine (the addictive substance in cigarettes). You may crave cigarettes, be irritable, feel very hungry, cough often, get headaches, or have difficulty concentrating. The withdrawal symptoms are only temporary. They are strongest when you first quit but will go away within 10-14 days. When withdrawal symptoms occur, stay in control. Think about your reasons for quitting. Remind yourself that these are signs that your body is healing and getting used to being without cigarettes. Remember that withdrawal symptoms are easier to treat than the major diseases that smoking can cause.  Even after the withdrawal is over, expect periodic urges to smoke. However, these cravings are generally short lived and will go away whether you smoke or not. Do not smoke! WHAT RESOURCES ARE AVAILABLE TO HELP ME QUIT SMOKING? Your health care provider can direct you to community resources or hospitals for support, which may include:  Group support.  Education.  Hypnosis.  Therapy. Document Released: 02/27/2004 Document Revised: 10/15/2013 Document Reviewed: 11/16/2012 [facility] Patient Information 2015 [place], [place]. This information is not intended to replace advice given to you by your health care provider. Make sure you discuss any questions you have with your health care provider.

## 2014-12-12 ENCOUNTER — Other Ambulatory Visit: Payer: Self-pay | Admitting: Family Medicine

## 2014-12-12 ENCOUNTER — Other Ambulatory Visit: Payer: Self-pay

## 2014-12-12 DIAGNOSIS — Z1231 Encounter for screening mammogram for malignant neoplasm of breast: Secondary | ICD-10-CM

## 2014-12-12 DIAGNOSIS — R319 Hematuria, unspecified: Secondary | ICD-10-CM

## 2014-12-18 ENCOUNTER — Telehealth: Payer: Self-pay

## 2014-12-18 NOTE — Telephone Encounter (Signed)
Notes Recorded by Yvette Rack on 12/13/2014 at 9:24 AM Spoke with pt.son Gave him pt results

## 2014-12-18 NOTE — Telephone Encounter (Signed)
Pt's son would like a CB concerning his mom's   lab results. He often comes in with his mom and dad. Please advise at [phone]

## 2015-02-23 ENCOUNTER — Other Ambulatory Visit: Payer: Self-pay | Admitting: Family Medicine

## 2015-06-02 ENCOUNTER — Ambulatory Visit (INDEPENDENT_AMBULATORY_CARE_PROVIDER_SITE_OTHER): Payer: BLUE CROSS/BLUE SHIELD | Admitting: Family Medicine

## 2015-06-02 ENCOUNTER — Encounter: Payer: Self-pay | Admitting: Family Medicine

## 2015-06-02 VITALS — BP 144/72 | HR 58 | Temp 97.8°F | Resp 16 | Ht 65.0 in | Wt 140.0 lb

## 2015-06-02 DIAGNOSIS — F17200 Nicotine dependence, unspecified, uncomplicated: Secondary | ICD-10-CM | POA: Diagnosis not present

## 2015-06-02 DIAGNOSIS — I10 Essential (primary) hypertension: Secondary | ICD-10-CM | POA: Diagnosis not present

## 2015-06-02 DIAGNOSIS — F329 Major depressive disorder, single episode, unspecified: Secondary | ICD-10-CM

## 2015-06-02 DIAGNOSIS — F32A Depression, unspecified: Secondary | ICD-10-CM

## 2015-06-02 LAB — BASIC METABOLIC PANEL
BUN: 15 mg/dL (ref 7–25)
CHLORIDE: 101 mmol/L (ref 98–110)
CO2: 28 mmol/L (ref 20–31)
Calcium: 9.6 mg/dL (ref 8.6–10.4)
Creat: 0.71 mg/dL (ref 0.50–0.99)
GLUCOSE: 90 mg/dL (ref 65–99)
POTASSIUM: 3.9 mmol/L (ref 3.5–5.3)
SODIUM: 138 mmol/L (ref 135–146)

## 2015-06-02 MED ORDER — SERTRALINE HCL 100 MG PO TABS: ORAL_TABLET | ORAL | Status: DC

## 2015-06-02 NOTE — Progress Notes (Signed)
Subjective:    Patient ID: [name], female    DOB: February 05, 1951, 64 y.o.   MRN: [id number]  HPI This is a pleasant 64 yo female who presents today for follow up of HTN. Her son is with her and helps with translation. She checks her blood pressure at home. It runs below 431 systolic and 54M diastolic. She continues to smoke 1 ppd. Her husband does not smoke, her son and daughter who live with her both smoke. She works and helps her son take care of her 53 yo grandson.   She has noticed some balance issues about 2x / week for a couple of weeks and some pressure in her right ear. No recent nasal congestion/rhinorrhea.   She finds it difficult to relax. Her son states that she always has to be working around the house. She does not really have any hobbies or interests. Her son states that, "she can't let go of the past." She doesn't sleep well, wakes frequently with her grandson. She worries that he will choke in the night. "I worry about everything." She attends church, but has not been much lately due to her work schedule.   Past Medical History  Diagnosis Date  . Depression   . Arthritis   . Hypertension   . Hyperlipidemia    Past Surgical History  Procedure Laterality Date  . Abdominal hysterectomy      2009 , due to fobroids, benign path for uterus, tumor and also cervix.    No family history on file. Social History  Substance Use Topics  . Smoking status: Current Every Day Smoker -- 1.00 packs/day for 37 years    Types: Cigarettes  . Smokeless tobacco: None  . Alcohol Use: No    Review of Systems  HENT: Positive for ear pain (right ear pressure). Negative for rhinorrhea.   Respiratory: Negative for cough and shortness of breath.   Cardiovascular: Negative for chest pain, palpitations and leg swelling.  Neurological: Positive for light-headedness. Negative for dizziness and headaches.  Psychiatric/Behavioral: Positive for sleep disturbance and dysphoric mood.        Objective:   Physical Exam  Constitutional: She is oriented to person, place, and time. She appears well-developed and well-nourished.  HENT:  Head: Normocephalic and atraumatic.  Right Ear: External ear normal.  Left Ear: Tympanic membrane, external ear and ear canal normal.  Nose: Nose normal.  Mouth/Throat: Oropharynx is clear and moist.  Right ear with moderate amount cerumen, irrigated with good result. Patient tolerated well.   Eyes: Conjunctivae are normal.  Neck: Normal range of motion. Neck supple.  Cardiovascular: Normal rate, regular rhythm and normal heart sounds.   Pulmonary/Chest: Effort normal and breath sounds normal.  Musculoskeletal: Normal range of motion.  Neurological: She is alert and oriented to person, place, and time.  Skin: Skin is warm and dry.  Psychiatric: She has a normal mood and affect. Her behavior is normal. Judgment and thought content normal.  Vitals reviewed.  BP 144/72 mmHg  Pulse 58  Temp(Src) 97.8 F (36.6 C)  Resp 16  Ht '5\' 5"'$  (1.651 m)  Wt 140 lb (63.504 kg)  BMI 23.30 kg/m2 Wt Readings from Last 3 Encounters:  06/02/15 140 lb (63.504 kg)  12/11/14 134 lb (60.782 kg)  11/25/13 126 lb 8 oz (57.38 kg)   Depression screen [facility] 2/9 12/11/2014 07/03/2012  Decreased Interest 1 1  Down, Depressed, Hopeless 1 1  PHQ - 2 Score 2 2  Altered sleeping 0  1  Tired, decreased energy 1 1  Change in appetite 1 0  Feeling bad or failure about yourself  0 0  Trouble concentrating 0 1  Moving slowly or fidgety/restless 0 0  Suicidal thoughts 0 0  PHQ-9 Score 4 5  Difficult doing work/chores Somewhat difficult -      Assessment & Plan:  1. Essential hypertension - continue HCTZ and periodic home BP monitoring - Basic metabolic panel  2. SMOKER - encouraged the patient and her son to pick stop smoking date  3. Depression - discussed treatment options and encouraged patient to try therapy. She is not interested at this time, preferring  instead to talk to her family about her issues.  - sertraline (ZOLOFT) 100 MG tablet; Take 1/2 tablet daily for 3 days then take 1 tablet daily  Dispense: 30 tablet; Refill: 3 - provided written information about sertraline and discussed possible side effects - encouraged her to go to church regularly, increase her socialization, work on sleep hygiene  4. Lightheadedness - this has been very occasional, no positional. Large amount cerumen removed today, will see if she has further symptoms - RTC if worsening/increased frquency  - follow up 8 weeks  Clarene Reamer, FNP-BC  [facility] [place] [facility]  06/02/2015 9:45 AM

## 2015-06-02 NOTE — Patient Instructions (Signed)
Sertraline tablets What is this medicine? SERTRALINE (SER tra leen) is used to treat depression. It may also be used to treat obsessive compulsive disorder, panic disorder, post-trauma stress, premenstrual dysphoric disorder (PMDD) or social anxiety. This medicine may be used for other purposes; ask your health care provider or pharmacist if you have questions. What should I tell my health care provider before I take this medicine? They need to know if you have any of these conditions: -bipolar disorder or a family history of bipolar disorder -diabetes -glaucoma -heart disease -high blood pressure -history of irregular heartbeat -history of low levels of calcium, magnesium, or potassium in the blood -if you often drink alcohol -liver disease -receiving electroconvulsive therapy -seizures -suicidal thoughts, plans, or attempt; a previous suicide attempt by you or a family member -thyroid disease -an unusual or allergic reaction to sertraline, other medicines, foods, dyes, or preservatives -pregnant or trying to get pregnant -breast-feeding How should I use this medicine? Take this medicine by mouth with a glass of water. Follow the directions on the prescription label. You can take it with or without food. Take your medicine at regular intervals. Do not take your medicine more often than directed. Do not stop taking this medicine suddenly except upon the advice of your doctor. Stopping this medicine too quickly may cause serious side effects or your condition may worsen. A special MedGuide will be given to you by the pharmacist with each prescription and refill. Be sure to read this information carefully each time. Talk to your pediatrician regarding the use of this medicine in children. While this drug may be prescribed for children as young as 7 years for selected conditions, precautions do apply. Overdosage: If you think you have taken too much of this medicine contact a poison control  center or emergency room at once. NOTE: This medicine is only for you. Do not share this medicine with others. What if I miss a dose? If you miss a dose, take it as soon as you can. If it is almost time for your next dose, take only that dose. Do not take double or extra doses. What may interact with this medicine? Do not take this medicine with any of the following medications: -certain medicines for fungal infections like fluconazole, itraconazole, ketoconazole, posaconazole, voriconazole -cisapride -disulfiram -dofetilide -linezolid -MAOIs like Carbex, Eldepryl, Marplan, Nardil, and Parnate -metronidazole -methylene blue (injected into a vein) -pimozide -thioridazine -ziprasidone This medicine may also interact with the following medications: -alcohol -aspirin and aspirin-like medicines -certain medicines for depression, anxiety, or psychotic disturbances -certain medicines for irregular heart beat like flecainide, propafenone -certain medicines for migraine headaches like almotriptan, eletriptan, frovatriptan, naratriptan, rizatriptan, sumatriptan, zolmitriptan -certain medicines for sleep -certain medicines for seizures like carbamazepine, valproic acid, phenytoin -certain medicines that treat or prevent blood clots like warfarin, enoxaparin, dalteparin -cimetidine -digoxin -diuretics -fentanyl -furazolidone -isoniazid -lithium -NSAIDs, medicines for pain and inflammation, like ibuprofen or naproxen -other medicines that prolong the QT interval (cause an abnormal heart rhythm) -procarbazine -rasagiline -supplements like St. John's wort, kava kava, valerian -tolbutamide -tramadol -tryptophan This list may not describe all possible interactions. Give your health care provider a list of all the medicines, herbs, non-prescription drugs, or dietary supplements you use. Also tell them if you smoke, drink alcohol, or use illegal drugs. Some items may interact with your  medicine. What should I watch for while using this medicine? Tell your doctor if your symptoms do not get better or if they get worse. Visit your doctor  or health care professional for regular checks on your progress. Because it may take several weeks to see the full effects of this medicine, it is important to continue your treatment as prescribed by your doctor. Patients and their families should watch out for new or worsening thoughts of suicide or depression. Also watch out for sudden changes in feelings such as feeling anxious, agitated, panicky, irritable, hostile, aggressive, impulsive, severely restless, overly excited and hyperactive, or not being able to sleep. If this happens, especially at the beginning of treatment or after a change in dose, call your health care professional. Dennis Bast may get drowsy or dizzy. Do not drive, use machinery, or do anything that needs mental alertness until you know how this medicine affects you. Do not stand or sit up quickly, especially if you are an older patient. This reduces the risk of dizzy or fainting spells. Alcohol may interfere with the effect of this medicine. Avoid alcoholic drinks. Your mouth may get dry. Chewing sugarless gum or sucking hard candy, and drinking plenty of water may help. Contact your doctor if the problem does not go away or is severe. What side effects may I notice from receiving this medicine? Side effects that you should report to your doctor or health care professional as soon as possible: -allergic reactions like skin rash, itching or hives, swelling of the face, lips, or tongue -black or bloody stools, blood in the urine or vomit -fast, irregular heartbeat -feeling faint or lightheaded, falls -hallucination, loss of contact with reality -seizures -suicidal thoughts or other mood changes -unusual bleeding or bruising -unusually weak or tired -vomiting Side effects that usually do not require medical attention (report to your  doctor or health care professional if they continue or are bothersome): -change in appetite -change in sex drive or performance -diarrhea -increased sweating -indigestion, nausea -tremors This list may not describe all possible side effects. Call your doctor for medical advice about side effects. You may report side effects to FDA at 1-800-FDA-1088. Where should I keep my medicine? Keep out of the reach of children. Store at room temperature between 15 and 30 degrees C (59 and 86 degrees F). Throw away any unused medicine after the expiration date. NOTE: This sheet is a summary. It may not cover all possible information. If you have questions about this medicine, talk to your doctor, pharmacist, or health care provider.    2016, Elsevier/Gold Standard. (2012-12-26 12:57:35)

## 2015-06-27 ENCOUNTER — Ambulatory Visit (INDEPENDENT_AMBULATORY_CARE_PROVIDER_SITE_OTHER): Payer: BLUE CROSS/BLUE SHIELD | Admitting: Emergency Medicine

## 2015-06-27 ENCOUNTER — Ambulatory Visit (INDEPENDENT_AMBULATORY_CARE_PROVIDER_SITE_OTHER): Payer: BLUE CROSS/BLUE SHIELD

## 2015-06-27 VITALS — BP 136/72 | HR 59 | Temp 98.4°F | Resp 20 | Ht 63.0 in | Wt 142.6 lb

## 2015-06-27 DIAGNOSIS — I10 Essential (primary) hypertension: Secondary | ICD-10-CM | POA: Diagnosis not present

## 2015-06-27 DIAGNOSIS — F329 Major depressive disorder, single episode, unspecified: Secondary | ICD-10-CM | POA: Diagnosis not present

## 2015-06-27 DIAGNOSIS — M5440 Lumbago with sciatica, unspecified side: Secondary | ICD-10-CM

## 2015-06-27 DIAGNOSIS — R42 Dizziness and giddiness: Secondary | ICD-10-CM | POA: Diagnosis not present

## 2015-06-27 DIAGNOSIS — F32A Depression, unspecified: Secondary | ICD-10-CM

## 2015-06-27 DIAGNOSIS — M51379 Other intervertebral disc degeneration, lumbosacral region without mention of lumbar back pain or lower extremity pain: Secondary | ICD-10-CM

## 2015-06-27 DIAGNOSIS — M5137 Other intervertebral disc degeneration, lumbosacral region: Secondary | ICD-10-CM

## 2015-06-27 LAB — POCT CBC
GRANULOCYTE PERCENT: 69.3 % (ref 37–80)
HCT, POC: 40.6 % (ref 37.7–47.9)
HEMOGLOBIN: 13.6 g/dL (ref 12.2–16.2)
Lymph, poc: 2.3 (ref 0.6–3.4)
MCH: 29.4 pg (ref 27–31.2)
MCHC: 33.5 g/dL (ref 31.8–35.4)
MCV: 87.9 fL (ref 80–97)
MID (cbc): 0.4 (ref 0–0.9)
MPV: 7.2 fL (ref 0–99.8)
PLATELET COUNT, POC: 215 10*3/uL (ref 142–424)
POC Granulocyte: 6.1 (ref 2–6.9)
POC LYMPH PERCENT: 26.5 %L (ref 10–50)
POC MID %: 4.2 %M (ref 0–12)
RBC: 4.62 M/uL (ref 4.04–5.48)
RDW, POC: 13.6 %
WBC: 8.8 10*3/uL (ref 4.6–10.2)

## 2015-06-27 LAB — GLUCOSE, POCT (MANUAL RESULT ENTRY): POC GLUCOSE: 114 mg/dL — AB (ref 70–99)

## 2015-06-27 MED ORDER — MELOXICAM 7.5 MG PO TABS: ORAL_TABLET | ORAL | Status: DC

## 2015-06-27 NOTE — Patient Instructions (Signed)

## 2015-06-27 NOTE — Progress Notes (Addendum)
Patient ID: [name], female   DOB: 05-29-1951, 65 y.o.   MRN: [id number]     By signing my name below, I, Zola Button, attest that this documentation has been prepared under the direction and in the presence of Arlyss Queen, MD.  Electronically Signed: Zola Button, Medical Scribe. 06/27/2015. 12:33 PM.   Chief Complaint:  Chief Complaint  Patient presents with  . Dizziness    today  . Depression    see screening    HPI: [name] is a 65 y.o. female with a history of hypertension who reports to [facility] today complaining of an episode of dizziness that started around 9:00 AM this morning and lasted a few seconds. The dizziness is described as feeling off-balance and was not a room-spinning sensation. She states she was pulled to the right but did not fall. Patient notes she currently feels pressure/squeezing to the top of her head. She does not feel any dizziness currently. Patient denies chest pain. Her son notes that she has been having lower back pain over the last few weeks. He also notes that she has been feeling depressed intermittently and is also stressed out; he feels this may have contributed to her symptoms. She reports sleeping well at night, but not very long. Patient was started on Zoloft at her visit with Clarene Reamer, NP on 06/02/15, but she has not been taking it.  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  Past Medical History  Diagnosis Date  . Depression   . Arthritis   . Hypertension   . Hyperlipidemia    Past Surgical History  Procedure Laterality Date  . Abdominal hysterectomy      2009 , due to fobroids, benign path for uterus, tumor and also cervix.    Social History   Social History  . Marital Status: Married    Spouse Name: N/A  . Number of Children: N/A  . Years of Education: N/A   Social History Main Topics  . Smoking status: Current Every Day Smoker -- 1.00 packs/day for 37 years    Types:  Cigarettes  . Smokeless tobacco: None  . Alcohol Use: No  . Drug Use: No  . Sexual Activity: Not Asked   Other Topics Concern  . None   Social History Narrative   History reviewed. No pertinent family history. Allergies  Allergen Reactions  . Penicillins    Prior to Admission medications   Medication Sig Start Date End Date Taking? Authorizing Provider  atorvastatin (LIPITOR) 40 MG tablet TAKE 1 TABLET BY MOUTH AT BEDTIME 02/24/15  Yes Elby Beck, FNP  hydrochlorothiazide (HYDRODIURIL) 25 MG tablet TAKE 1 TABLET BY MOUTH DAILY 02/24/15  Yes Elby Beck, FNP  fluticasone (FLONASE) 50 MCG/ACT nasal spray Place 2 sprays into the nose daily. Patient not taking: Reported on 06/27/2015 07/01/12   Leandrew Koyanagi, MD  sertraline (ZOLOFT) 100 MG tablet Take 1/2 tablet daily for 3 days then take 1 tablet daily Patient not taking: Reported on 06/27/2015 06/02/15   Elby Beck, FNP     ROS: The patient denies fevers, chills, night sweats, unintentional weight loss, chest pain, palpitations, wheezing, dyspnea on exertion, nausea, vomiting, abdominal pain, dysuria, hematuria, melena, numbness, weakness, or tingling.  All other systems have been reviewed and were otherwise negative with the exception of those mentioned in the HPI and as above.    PHYSICAL EXAM: Filed Vitals:   06/27/15 1217  BP:  136/72  Pulse: 59  Temp: 98.4 F (36.9 C)  Resp: 20   Body mass index is 25.27 kg/(m^2).   General: Alert, no acute distress. Not ill-appearing.  HEENT:  Normocephalic, atraumatic, oropharynx patent. Eye: Juliette Mangle [facility] Cardiovascular:  Regular rate and rhythm, no rubs murmurs or gallops.  No carotid bruits. Respiratory: Clear to auscultation bilaterally.  No wheezes, rales, or rhonchi.  No cyanosis, no use of accessory musculature Abdominal: No organomegaly, abdomen is soft and non-tender, positive bowel sounds.  No masses. Musculoskeletal: Tenderness over the lower lumbar  spine. Pain with straight leg raising at about 80 degrees. Skin: No rashes. Neurologic: Facial musculature symmetric. Knee reflexes 1+. Ankle reflexes 2+. No weakness. Psychiatric: Patient acts appropriately throughout our interaction. Lymphatic: No cervical or submandibular lymphadenopathy       LABS: Results for orders placed or performed in visit on 06/27/15  POCT CBC  Result Value Ref Range   WBC 8.8 4.6 - 10.2 K/uL   Lymph, poc 2.3 0.6 - 3.4   POC LYMPH PERCENT 26.5 10 - 50 %L   MID (cbc) 0.4 0 - 0.9   POC MID % 4.2 0 - 12 %M   POC Granulocyte 6.1 2 - 6.9   Granulocyte percent 69.3 37 - 80 %G   RBC 4.62 4.04 - 5.48 M/uL   Hemoglobin 13.6 12.2 - 16.2 g/dL   HCT, POC 40.6 37.7 - 47.9 %   MCV 87.9 80 - 97 fL   MCH, POC 29.4 27 - 31.2 pg   MCHC 33.5 31.8 - 35.4 g/dL   RDW, POC 13.6 %   Platelet Count, POC 215 142 - 424 K/uL   MPV 7.2 0 - 99.8 fL  POCT glucose (manual entry)  Result Value Ref Range   POC Glucose 114 (A) 70 - 99 mg/dl     EKG/XRAY:   Primary read interpreted by Dr. Everlene Farrier at [facility]. Patient has L5-S1 disc disease. There are calcifications present in the right upper abdomen please comment. Of note patient had an MRI abdomen 2013.   ASSESSMENT/PLAN: Orthopedic referral made for back pain. She is placed on Mobic 7.5 mg daily. She can take Tylenol at night. She was given stretches to do for her back. I encouraged her to try her antidepressant. I encouraged her to consider counseling. She had a nonfocal neurological examination. He was advised to quit smoking continue her medications and not work so hard. I advised her she should consider trying the Zoloft at a half pill a day to see if it helped with depression.I personally performed the services described in this documentation, which was scribed in my presence. The recorded information has been reviewed and is accurate.    Gross sideeffects, risk and benefits, and alternatives of medications d/w patient. Patient  is aware that all medications have potential sideeffects and we are unable to predict every sideeffect or drug-drug interaction that may occur.  Arlyss Queen MD 06/27/2015 12:33 PM

## 2015-07-01 ENCOUNTER — Telehealth: Payer: Self-pay

## 2015-07-01 NOTE — Telephone Encounter (Signed)
Pt caretaker notified letter ready.

## 2015-07-01 NOTE — Telephone Encounter (Signed)
Attention: Dr. Everlene Farrier, Pt was seen on 1/13 by Dr. Everlene Farrier and she received a letter stating she could RTW on 1/15. She did work on 1/15  and her back was hurting her. She would like a new note so she can be off today and tomorrow. RTW on the 2/19. CB  [phone]

## 2015-07-01 NOTE — Telephone Encounter (Signed)
Please write a note excusing patient 1/16 1/17 and 1/18 return to work 1/19.

## 2015-07-03 ENCOUNTER — Telehealth: Payer: Self-pay

## 2015-07-03 NOTE — Telephone Encounter (Signed)
Work note requested - the patient would like the work note to excuse her from work until further notice.  She has not been scheduled with Raliegh Ip yet to examine her back pain, but she does not feel she will be able to work until she is seen by the specialist.  The patient's son is calling today to schedule her for an appointment at [facility].  She was seen on 06/27/15 by Dr Everlene Farrier for the back pain.

## 2015-07-03 NOTE — Telephone Encounter (Signed)
Ok for note 

## 2015-07-03 NOTE — Telephone Encounter (Signed)
We need to know when the appointment is. Get the exact appointment time and then give patient a note until that date.

## 2015-07-04 ENCOUNTER — Other Ambulatory Visit: Payer: Self-pay | Admitting: Family Medicine

## 2015-07-04 NOTE — Telephone Encounter (Signed)
SPoke with pt's son. She received the note from the specialist yesterday.

## 2015-08-27 ENCOUNTER — Telehealth: Payer: Self-pay | Admitting: Family Medicine

## 2015-09-03 ENCOUNTER — Ambulatory Visit: Payer: BLUE CROSS/BLUE SHIELD | Admitting: Family Medicine

## 2015-09-09 ENCOUNTER — Ambulatory Visit (INDEPENDENT_AMBULATORY_CARE_PROVIDER_SITE_OTHER): Payer: BLUE CROSS/BLUE SHIELD | Admitting: Family Medicine

## 2015-09-09 VITALS — BP 136/68 | HR 65 | Temp 99.1°F | Resp 16 | Ht 62.0 in | Wt 140.0 lb

## 2015-09-09 DIAGNOSIS — F32A Depression, unspecified: Secondary | ICD-10-CM

## 2015-09-09 DIAGNOSIS — R42 Dizziness and giddiness: Secondary | ICD-10-CM

## 2015-09-09 DIAGNOSIS — F17200 Nicotine dependence, unspecified, uncomplicated: Secondary | ICD-10-CM

## 2015-09-09 DIAGNOSIS — F329 Major depressive disorder, single episode, unspecified: Secondary | ICD-10-CM | POA: Diagnosis not present

## 2015-09-09 DIAGNOSIS — I10 Essential (primary) hypertension: Secondary | ICD-10-CM | POA: Diagnosis not present

## 2015-09-09 NOTE — Progress Notes (Signed)
Subjective:    Patient ID: [name], female    DOB: January 04, 1951, 65 y.o.   MRN: [id number]  HPI This is a 65 yo female who presents today for follow up of depression, back pain, elevated cholesterol, HTN. She is accompanied by her son and 74 yo grandson. She was seen 06/27/15 with dizziness and back pain. CBC normal. She was seen by ortho who recommended MRI. 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 No relief with PT, steroid shots. She is awaiting evaluation for surgery.   Her son reports that her mood is much improved. She took one bottle of sertraline and stopped it because she felt better. Has been much less stressed since being out of work for her back. Gets a little bored.   Tolerating medication without side effects.    Past Medical History  Diagnosis Date  . Depression   . Arthritis   . Hypertension   . Hyperlipidemia    Past Surgical History  Procedure Laterality Date  . Abdominal hysterectomy      2009 , due to fobroids, benign path for uterus, tumor and also cervix.    No family history on file. Social History  Substance Use Topics  . Smoking status: Current Every Day Smoker -- 1.00 packs/day for 37 years    Types: Cigarettes  . Smokeless tobacco: Not on file  . Alcohol Use: No      Review of Systems No SOB, had a cold last week- improved, lightheaded a couple times a week. Sleeping better.     Objective:   Physical Exam Physical Exam  Constitutional: Oriented to person, place, and time. She appears well-developed and well-nourished.  HENT:  Head: Normocephalic and atraumatic.  Eyes: Conjunctivae are normal.  Neck: Normal range of motion. Neck supple.  Cardiovascular: Normal rate, regular rhythm and normal heart sounds.   Pulmonary/Chest: Effort normal and breath sounds normal.  Musculoskeletal: Normal range of motion.  Neurological: Alert and oriented to person, place, and time.  Skin: Skin is warm and dry.  Psychiatric: Normal mood and affect. Behavior  is normal. Judgment and thought content normal.  Vitals reviewed.  BP 136/68 mmHg  Pulse 65  Temp(Src) 99.1 F (37.3 C) (Oral)  Resp 16  Ht '5\' 2"'$  (1.575 m)  Wt 140 lb (63.504 kg)  BMI 25.60 kg/m2  SpO2 97% Wt Readings from Last 3 Encounters:  09/09/15 140 lb (63.504 kg)  06/27/15 142 lb 9.6 oz (64.683 kg)  06/02/15 140 lb (63.504 kg)   Depression screen [facility] 2/9 09/09/2015 09/09/2015 06/27/2015 12/11/2014 07/03/2012  Decreased Interest 0 0 '3 1 1  '$ Down, Depressed, Hopeless 0 0 '3 1 1  '$ PHQ - 2 Score 0 0 '6 2 2  '$ Altered sleeping - - 0 0 1  Tired, decreased energy - - '2 1 1  '$ Change in appetite - - 0 1 0  Feeling bad or failure about yourself  - - 0 0 0  Trouble concentrating - - 0 0 1  Moving slowly or fidgety/restless - - 0 0 0  Suicidal thoughts - - 0 0 0  PHQ-9 Score - - '8 4 5  '$ Difficult doing work/chores - - - Somewhat difficult -        Assessment & Plan:  1. Dizziness and giddiness - significantly improved, return to clinic if increase in frequency or intensity  2. Depression - this is improved, likely due to being out of work in anticipation of spinal surgery and decreased  stress - encouraged her to walk every day to help with mood and to prepare for surgery  3. Essential hypertension - well controlled  4. SMOKER - encouraged her to stop smoking and provided written tips for success. Discussed importance with surgery in near future.   - follow up in 3 months   Clarene Reamer, FNP-BC  [facility] [place] [facility]  09/11/2015 9:32 PM

## 2015-09-09 NOTE — Patient Instructions (Addendum)
Please walk every day to get your body ready for surgery  Consider decreasing your cigarette smoking in preparation for surgery Smoking Cessation, Tips for Success If you are ready to quit smoking, congratulations! You have chosen to help yourself be healthier. Cigarettes bring nicotine, tar, carbon monoxide, and other irritants into your body. Your lungs, heart, and blood vessels will be able to work better without these poisons. There are many different ways to quit smoking. Nicotine gum, nicotine patches, a nicotine inhaler, or nicotine nasal spray can help with physical craving. Hypnosis, support groups, and medicines help break the habit of smoking. WHAT THINGS CAN I DO TO MAKE QUITTING EASIER?  Here are some tips to help you quit for good:  Pick a date when you will quit smoking completely. Tell all of your friends and family about your plan to quit on that date.  Do not try to slowly cut down on the number of cigarettes you are smoking. Pick a quit date and quit smoking completely starting on that day.  Throw away all cigarettes.   Clean and remove all ashtrays from your home, work, and car.  On a card, write down your reasons for quitting. Carry the card with you and read it when you get the urge to smoke.  Cleanse your body of nicotine. Drink enough water and fluids to keep your urine clear or pale yellow. Do this after quitting to flush the nicotine from your body.  Learn to predict your moods. Do not let a bad situation be your excuse to have a cigarette. Some situations in your life might tempt you into wanting a cigarette.  Never have "just one" cigarette. It leads to wanting another and another. Remind yourself of your decision to quit.  Change habits associated with smoking. If you smoked while driving or when feeling stressed, try other activities to replace smoking. Stand up when drinking your coffee. Brush your teeth after eating. Sit in a different chair when you read  the paper. Avoid alcohol while trying to quit, and try to drink fewer caffeinated beverages. Alcohol and caffeine may urge you to smoke.  Avoid foods and drinks that can trigger a desire to smoke, such as sugary or spicy foods and alcohol.  Ask people who smoke not to smoke around you.  Have something planned to do right after eating or having a cup of coffee. For example, plan to take a walk or exercise.  Try a relaxation exercise to calm you down and decrease your stress. Remember, you may be tense and nervous for the first 2 weeks after you quit, but this will pass.  Find new activities to keep your hands busy. Play with a pen, coin, or rubber band. Doodle or draw things on paper.  Brush your teeth right after eating. This will help cut down on the craving for the taste of tobacco after meals. You can also try mouthwash.   Use oral substitutes in place of cigarettes. Try using lemon drops, carrots, cinnamon sticks, or chewing gum. Keep them handy so they are available when you have the urge to smoke.  When you have the urge to smoke, try deep breathing.  Designate your home as a nonsmoking area.  If you are a heavy smoker, ask your health care provider about a prescription for nicotine chewing gum. It can ease your withdrawal from nicotine.  Reward yourself. Set aside the cigarette money you save and buy yourself something nice.  Look for support from others.  Join a support group or smoking cessation program. Ask someone at home or at work to help you with your plan to quit smoking.  Always ask yourself, "Do I need this cigarette or is this just a reflex?" Tell yourself, "Today, I choose not to smoke," or "I do not want to smoke." You are reminding yourself of your decision to quit.  Do not replace cigarette smoking with electronic cigarettes (commonly called e-cigarettes). The safety of e-cigarettes is unknown, and some may contain harmful chemicals.  If you relapse, do not give  up! Plan ahead and think about what you will do the next time you get the urge to smoke. HOW WILL I FEEL WHEN I QUIT SMOKING? You may have symptoms of withdrawal because your body is used to nicotine (the addictive substance in cigarettes). You may crave cigarettes, be irritable, feel very hungry, cough often, get headaches, or have difficulty concentrating. The withdrawal symptoms are only temporary. They are strongest when you first quit but will go away within 10-14 days. When withdrawal symptoms occur, stay in control. Think about your reasons for quitting. Remind yourself that these are signs that your body is healing and getting used to being without cigarettes. Remember that withdrawal symptoms are easier to treat than the major diseases that smoking can cause.  Even after the withdrawal is over, expect periodic urges to smoke. However, these cravings are generally short lived and will go away whether you smoke or not. Do not smoke! WHAT RESOURCES ARE AVAILABLE TO HELP ME QUIT SMOKING? Your health care provider can direct you to community resources or hospitals for support, which may include:  Group support.  Education.  Hypnosis.  Therapy.   This information is not intended to replace advice given to you by your health care provider. Make sure you discuss any questions you have with your health care provider.   Document Released: 02/27/2004 Document Revised: 06/21/2014 Document Reviewed: 11/16/2012 Elsevier Interactive Patient Education 2016 [employer].    IF you received an x-ray today, you will receive an invoice from [facility]. Please contact [facility] at [phone] with questions or concerns regarding your invoice.   IF you received labwork today, you will receive an invoice from [employer]. Please contact [facility] at [phone] with questions or concerns regarding your invoice.   Our billing staff will not be able to  assist you with questions regarding bills from these companies.  You will be contacted with the lab results as soon as they are available. The fastest way to get your results is to activate your My Chart account. Instructions are located on the last page of this paperwork. If you have not heard from [place] regarding the results in 2 weeks, please contact this office.

## 2015-10-06 ENCOUNTER — Other Ambulatory Visit: Payer: Self-pay | Admitting: Family Medicine

## 2016-01-28 ENCOUNTER — Ambulatory Visit (INDEPENDENT_AMBULATORY_CARE_PROVIDER_SITE_OTHER): Payer: No Typology Code available for payment source | Admitting: Physician Assistant

## 2016-01-28 ENCOUNTER — Ambulatory Visit (INDEPENDENT_AMBULATORY_CARE_PROVIDER_SITE_OTHER): Payer: No Typology Code available for payment source

## 2016-01-28 VITALS — BP 114/72 | HR 68 | Temp 98.6°F | Resp 16 | Ht 62.0 in | Wt 150.8 lb

## 2016-01-28 DIAGNOSIS — M25572 Pain in left ankle and joints of left foot: Secondary | ICD-10-CM | POA: Diagnosis not present

## 2016-01-28 NOTE — Progress Notes (Signed)
By signing my name below, I, Mesha Guinyard, attest that this documentation has been prepared under the direction and in the presence of Ivar Drape, MD.  Electronically Signed: Verlee Monte, Medical Scribe. 01/28/16. 4:17 PM.  Subjective:    Patient ID: [name], female    DOB: 1950-07-19, 65 y.o.   MRN: [id number]  HPI Chief Complaint  Patient presents with   Ankle Injury    rolled left ankle on Monday     HPI Comments: [name] is a 65 y.o. female who presents to the [facility] complaining of left ankle pain 2 days ago. Pt doesn't speak English so her daughter translated for her. Pt was sitting down 3 days ago and she hurt her ankle when she got up. Pt felt pain a day later that was paired with swelling, and she couldn't put weight on her right ankle. Pt used a ice pack on her ankle without relief to her ankle. Pt had recent back surgery she's still recovering from, and thinks she twisted her ankle trying to be cautious of her back.   Patient Active Problem List   Diagnosis Date Noted   Sinus complaint 07/18/2011   PTSD (post-traumatic stress disorder) 07/18/2011   SMOKER 03/30/2010   HTN (hypertension) 03/30/2010   HYPERCHOLESTEROLEMIA 03/27/2010   ARTHRITIS 03/27/2010   CHEST PAIN 03/27/2010   Past Medical History:  Diagnosis Date   Arthritis    Depression    Hyperlipidemia    Hypertension    Past Surgical History:  Procedure Laterality Date   ABDOMINAL HYSTERECTOMY     2009 , due to fobroids, benign path for uterus, tumor and also cervix.    Allergies  Allergen Reactions   Penicillins    Prior to Admission medications   Medication Sig Start Date End Date Taking? Authorizing Provider  atorvastatin (LIPITOR) 40 MG tablet TAKE 1 TABLET BY MOUTH AT BEDTIME 02/24/15  Yes Elby Beck, FNP  fluticasone (FLONASE) 50 MCG/ACT nasal spray Place 2 sprays into the nose daily. 07/01/12  Yes Leandrew Koyanagi, MD    hydrochlorothiazide (HYDRODIURIL) 25 MG tablet TAKE 1 TABLET BY MOUTH DAILY 10/07/15  Yes Elby Beck, FNP  meloxicam (MOBIC) 15 MG tablet TK 1 T PO QAM WF 08/19/15  Yes Historical Provider, MD  methocarbamol (ROBAXIN) 500 MG tablet TK 1 T PO Q 8 TO 12 H PRF SPASMS 07/18/15  Yes Historical Provider, MD  sertraline (ZOLOFT) 100 MG tablet Take 1/2 tablet daily for 3 days then take 1 tablet daily 06/02/15  Yes Elby Beck, FNP   Social History   Social History   Marital status: Married    Spouse name: N/A   Number of children: N/A   Years of education: N/A   Occupational History   Not on file.   Social History Main Topics   Smoking status: Current Every Day Smoker    Packs/day: 1.00    Years: 37.00    Types: Cigarettes   Smokeless tobacco: Current User   Alcohol use No   Drug use: No   Sexual activity: Not on file   Other Topics Concern   Not on file   Social History Narrative   No narrative on file   Depression screen [facility] 2/9 01/28/2016 09/09/2015 09/09/2015 06/27/2015 12/11/2014  Decreased Interest 0 0 0 3 1  Down, Depressed, Hopeless 0 0 0 3 1  PHQ - 2 Score 0 0 0 6 2  Altered sleeping - - -  0 0  Tired, decreased energy - - - 2 1  Change in appetite - - - 0 1  Feeling bad or failure about yourself  - - - 0 0  Trouble concentrating - - - 0 0  Moving slowly or fidgety/restless - - - 0 0  Suicidal thoughts - - - 0 0  PHQ-9 Score - - - 8 4  Difficult doing work/chores - - - - Somewhat difficult   Review of Systems  Musculoskeletal: Positive for arthralgias and joint swelling.   ROS reviewed. Otherwise unremarkable, unless listed above. Objective:  Physical Exam  Constitutional: She appears well-developed and well-nourished. No distress.  HENT:  Head: Normocephalic and atraumatic.  Eyes: Conjunctivae are normal.  Neck: Neck supple.  Cardiovascular: Normal rate.   Pulmonary/Chest: Effort normal.  Musculoskeletal:  Minimal erythema of the lateral  base of the ankle Tenderness at the base of the lateral malleolus of the left ankle Nl ROM and resitance strength  Neurological: She is alert.  Skin: Skin is warm and dry.  Psychiatric: She has a normal mood and affect. Her behavior is normal.  Nursing note and vitals reviewed.  BP 114/72 (BP Location: Left Arm, Patient Position: Sitting, Cuff Size: Small)    Pulse 68    Temp 98.6 F (37 C) (Oral)    Resp 16    Ht '5\' 2"'$  (1.575 m)    Wt 150 lb 12.8 oz (68.4 kg)    SpO2 97%    BMI 27.58 kg/m    Dg Ankle Complete Left  Result Date: 01/28/2016 CLINICAL DATA:  Left ankle pain.  Rolled ankle. EXAM: LEFT ANKLE COMPLETE - 3+ VIEW COMPARISON:  10/04/2012 FINDINGS: No acute bony abnormality. Specifically, no fracture, subluxation, or dislocation. Soft tissues are intact. Joint spaces are maintained. IMPRESSION: No acute bony abnormality. Electronically Signed   By: Rolm Baptise M.D.   On: 01/28/2016 16:41   Assessment & Plan:  65 year old female is here today with left ankle pain. This looks like a light sprain. Advised Rice at this time. Patient declines a brace or crutches. Return to clinic as needed in 7-10 days. Left ankle pain - Plan: DG Ankle Complete Left  Ivar Drape, PA-C [facility] 8/26/20178:07 PM I personally performed the services described in this documentation, which was scribed in my presence. The recorded information has been reviewed and is accurate.

## 2016-01-28 NOTE — Patient Instructions (Addendum)
IF you received an x-ray today, you will receive an invoice from [facility] Radiology. Please contact [facility] Radiology at [phone] with questions or concerns regarding your invoice.   IF you received labwork today, you will receive an invoice from [employer]. Please contact [facility] at [phone] with questions or concerns regarding your invoice.   Our billing staff will not be able to assist you with questions regarding bills from these companies.  You will be contacted with the lab results as soon as they are available. The fastest way to get your results is to activate your My Chart account. Instructions are located on the last page of this paperwork. If you have not heard from [place] regarding the results in 2 weeks, please contact this office.   Please use ice for the pain, 3 times per day for 15 minutes Please perform the stretches three times per day then follow with the ice.  Acute Ankle Sprain With Phase I Rehab An acute ankle sprain is a partial or complete tear in one or more of the ligaments of the ankle due to traumatic injury. The severity of the injury depends on both the number of ligaments sprained and the grade of sprain. There are 3 grades of sprains.   A grade 1 sprain is a mild sprain. There is a slight pull without obvious tearing. There is no loss of strength, and the muscle and ligament are the correct length.  A grade 2 sprain is a moderate sprain. There is tearing of fibers within the substance of the ligament where it connects two bones or two cartilages. The length of the ligament is increased, and there is usually decreased strength.  A grade 3 sprain is a complete rupture of the ligament and is uncommon. In addition to the grade of sprain, there are three types of ankle sprains.  Lateral ankle sprains: This is a sprain of one or more of the three ligaments on the outer side (lateral) of the ankle. These are the most common  sprains. Medial ankle sprains: There is one large triangular ligament of the inner side (medial) of the ankle that is susceptible to injury. Medial ankle sprains are less common. Syndesmosis, "high ankle," sprains: The syndesmosis is the ligament that connects the two bones of the lower leg. Syndesmosis sprains usually only occur with very severe ankle sprains. SYMPTOMS  Pain, tenderness, and swelling in the ankle, starting at the side of injury that Madeline progress to the whole ankle and foot with time.  "Pop" or tearing sensation at the time of injury.  Bruising that Madeline spread to the heel.  Impaired ability to walk soon after injury. CAUSES   Acute ankle sprains are caused by trauma placed on the ankle that temporarily forces or pries the anklebone (talus) out of its normal socket.  Stretching or tearing of the ligaments that normally hold the joint in place (usually due to a twisting injury). RISK INCREASES WITH:  Previous ankle sprain.  Sports in which the foot Madeline land awkwardly (i.e., basketball, volleyball, or soccer) or walking or running on uneven or rough surfaces.  Shoes with inadequate support to prevent sideways motion when stress occurs.  Poor strength and flexibility.  Poor balance skills.  Contact sports. PREVENTION   Warm up and stretch properly before activity.  Maintain physical fitness:  Ankle and leg flexibility, muscle strength, and endurance.  Cardiovascular fitness.  Balance training activities.  Use proper technique and have a coach correct  improper technique.  Taping, protective strapping, bracing, or high-top tennis shoes Madeline help prevent injury. Initially, tape is best; however, it loses most of its support function within 10 to 15 minutes.  Wear proper-fitted protective shoes (High-top shoes with taping or bracing is more effective than either alone).  Provide the ankle with support during sports and practice activities for 12 months  following injury. PROGNOSIS   If treated properly, ankle sprains can be expected to recover completely; however, the length of recovery depends on the degree of injury.  A grade 1 sprain usually heals enough in 5 to 7 days to allow modified activity and requires an average of 6 weeks to heal completely.  A grade 2 sprain requires 6 to 10 weeks to heal completely.  A grade 3 sprain requires 12 to 16 weeks to heal.  A syndesmosis sprain often takes more than 3 months to heal. RELATED COMPLICATIONS   Frequent recurrence of symptoms Madeline result in a chronic problem. Appropriately addressing the problem the first time decreases the frequency of recurrence and optimizes healing time. Severity of the initial sprain does not predict the likelihood of later instability.  Injury to other structures (bone, cartilage, or tendon).  A chronically unstable or arthritic ankle joint is a possibility with repeated sprains. TREATMENT Treatment initially involves the use of ice, medication, and compression bandages to help reduce pain and inflammation. Ankle sprains are usually immobilized in a walking cast or boot to allow for healing. Crutches Madeline be recommended to reduce pressure on the injury. After immobilization, strengthening and stretching exercises Madeline be necessary to regain strength and a full range of motion. Surgery is rarely needed to treat ankle sprains. MEDICATION   Nonsteroidal anti-inflammatory medications, such as aspirin and ibuprofen (do not take for the first 3 days after injury or within 7 days before surgery), or other minor pain relievers, such as acetaminophen, are often recommended. Take these as directed by your caregiver. Contact your caregiver immediately if any bleeding, stomach upset, or signs of an allergic reaction occur from these medications.  Ointments applied to the skin Madeline be helpful.  Pain relievers Madeline be prescribed as necessary by your caregiver. Do not take  prescription pain medication for longer than 4 to 7 days. Use only as directed and only as much as you need. HEAT AND COLD  Cold treatment (icing) is used to relieve pain and reduce inflammation for acute and chronic cases. Cold should be applied for 10 to 15 minutes every 2 to 3 hours for inflammation and pain and immediately after any activity that aggravates your symptoms. Use ice packs or an ice massage.  Heat treatment Madeline be used before performing stretching and strengthening activities prescribed by your caregiver. Use a heat [name] or a warm soak. SEEK IMMEDIATE MEDICAL CARE IF:   Pain, swelling, or bruising worsens despite treatment.  You experience pain, numbness, discoloration, or coldness in the foot or toes.  New, unexplained symptoms develop (drugs used in treatment Madeline produce side effects.) EXERCISES  PHASE I EXERCISES RANGE OF MOTION (ROM) AND STRETCHING EXERCISES - Ankle Sprain, Acute Phase I, Weeks 1 to 2 These exercises Madeline help you when beginning to restore flexibility in your ankle. You will likely work on these exercises for the 1 to 2 weeks after your injury. Once your physician, physical therapist, or athletic trainer sees adequate progress, he or she will advance your exercises. While completing these exercises, remember:   Restoring tissue flexibility helps normal motion to  return to the joints. This allows healthier, less painful movement and activity.  An effective stretch should be held for at least 30 seconds.  A stretch should never be painful. You should only feel a gentle lengthening or release in the stretched tissue. RANGE OF MOTION - Dorsi/Plantar Flexion  While sitting with your right / left knee straight, draw the top of your foot upwards by flexing your ankle. Then reverse the motion, pointing your toes downward.  Hold each position for __________ seconds.  After completing your first set of exercises, repeat this exercise with your knee  bent. Repeat __________ times. Complete this exercise __________ times per day.  RANGE OF MOTION - Ankle Alphabet  Imagine your right / left big toe is a pen.  Keeping your hip and knee still, write out the entire alphabet with your "pen." Make the letters as large as you can without increasing any discomfort. Repeat __________ times. Complete this exercise __________ times per day.  STRENGTHENING EXERCISES - Ankle Sprain, Acute -Phase I, Weeks 1 to 2 These exercises Madeline help you when beginning to restore strength in your ankle. You will likely work on these exercises for 1 to 2 weeks after your injury. Once your physician, physical therapist, or athletic trainer sees adequate progress, he or she will advance your exercises. While completing these exercises, remember:   Muscles can gain both the endurance and the strength needed for everyday activities through controlled exercises.  Complete these exercises as instructed by your physician, physical therapist, or athletic trainer. Progress the resistance and repetitions only as guided.  You Madeline experience muscle soreness or fatigue, but the pain or discomfort you are trying to eliminate should never worsen during these exercises. If this pain does worsen, stop and make certain you are following the directions exactly. If the pain is still present after adjustments, discontinue the exercise until you can discuss the trouble with your clinician. STRENGTH - Dorsiflexors  Secure a rubber exercise band/tubing to a fixed object (i.e., table, pole) and loop the other end around your right / left foot.  Sit on the floor facing the fixed object. The band/tubing should be slightly tense when your foot is relaxed.  Slowly draw your foot back toward you using your ankle and toes.  Hold this position for __________ seconds. Slowly release the tension in the band and return your foot to the starting position. Repeat __________ times. Complete this exercise  __________ times per day.  STRENGTH - Plantar-flexors   Sit with your right / left leg extended. Holding onto both ends of a rubber exercise band/tubing, loop it around the ball of your foot. Keep a slight tension in the band.  Slowly push your toes away from you, pointing them downward.  Hold this position for __________ seconds. Return slowly, controlling the tension in the band/tubing. Repeat __________ times. Complete this exercise __________ times per day.  STRENGTH - Ankle Eversion  Secure one end of a rubber exercise band/tubing to a fixed object (table, pole). Loop the other end around your foot just before your toes.  Place your fists between your knees. This will focus your strengthening at your ankle.  Drawing the band/tubing across your opposite foot, slowly, pull your little toe out and up. Make sure the band/tubing is positioned to resist the entire motion.  Hold this position for __________ seconds. Have your muscles resist the band/tubing as it slowly pulls your foot back to the starting position.  Repeat __________ times. Complete this  exercise __________ times per day.  STRENGTH - Ankle Inversion  Secure one end of a rubber exercise band/tubing to a fixed object (table, pole). Loop the other end around your foot just before your toes.  Place your fists between your knees. This will focus your strengthening at your ankle.  Slowly, pull your big toe up and in, making sure the band/tubing is positioned to resist the entire motion.  Hold this position for __________ seconds.  Have your muscles resist the band/tubing as it slowly pulls your foot back to the starting position. Repeat __________ times. Complete this exercises __________ times per day.  STRENGTH - Towel Curls  Sit in a chair positioned on a non-carpeted surface.  Place your right / left foot on a towel, keeping your heel on the floor.  Pull the towel toward your heel by only curling your toes. Keep  your heel on the floor.  If instructed by your physician, physical therapist, or athletic trainer, add weight to the end of the towel. Repeat __________ times. Complete this exercise __________ times per day.   This information is not intended to replace advice given to you by your health care provider. Make sure you discuss any questions you have with your health care provider.   Document Released: 12/30/2004 Document Revised: 06/21/2014 Document Reviewed: 09/12/2008 Elsevier Interactive Patient Education [employer].   We recommend that you schedule a mammogram for breast cancer screening. Typically, you do not need a referral to do this. Please contact a local imaging center to schedule your mammogram.  [facility] - [phone]  *ask for the Radiology Department [facility] ([place]) - [phone] or [phone]  [facility] - [phone] [place] [phone] [facility] - [phone]  *ask for the [facility] - [phone]  *ask for the Radiology Department [facility] - [phone]  *ask for the [place] - [phone]

## 2016-03-10 ENCOUNTER — Ambulatory Visit (HOSPITAL_COMMUNITY)
Admission: RE | Admit: 2016-03-10 | Discharge: 2016-03-10 | Disposition: A | Discharge status: Home / Self Care | Payer: No Typology Code available for payment source | Source: Ambulatory Visit | Attending: Physician Assistant | Admitting: Physician Assistant

## 2016-03-10 ENCOUNTER — Ambulatory Visit (INDEPENDENT_AMBULATORY_CARE_PROVIDER_SITE_OTHER): Payer: No Typology Code available for payment source

## 2016-03-10 ENCOUNTER — Encounter: Payer: Self-pay | Admitting: Physician Assistant

## 2016-03-10 ENCOUNTER — Ambulatory Visit (INDEPENDENT_AMBULATORY_CARE_PROVIDER_SITE_OTHER): Payer: No Typology Code available for payment source | Admitting: Physician Assistant

## 2016-03-10 VITALS — BP 110/64 | HR 95 | Temp 98.0°F | Resp 18 | Ht 62.0 in | Wt 149.0 lb

## 2016-03-10 DIAGNOSIS — F17200 Nicotine dependence, unspecified, uncomplicated: Secondary | ICD-10-CM | POA: Diagnosis not present

## 2016-03-10 DIAGNOSIS — R5383 Other fatigue: Secondary | ICD-10-CM

## 2016-03-10 DIAGNOSIS — R05 Cough: Secondary | ICD-10-CM

## 2016-03-10 DIAGNOSIS — E785 Hyperlipidemia, unspecified: Secondary | ICD-10-CM | POA: Diagnosis not present

## 2016-03-10 DIAGNOSIS — R222 Localized swelling, mass and lump, trunk: Secondary | ICD-10-CM | POA: Insufficient documentation

## 2016-03-10 DIAGNOSIS — R059 Cough, unspecified: Secondary | ICD-10-CM

## 2016-03-10 DIAGNOSIS — I288 Other diseases of pulmonary vessels: Secondary | ICD-10-CM | POA: Diagnosis not present

## 2016-03-10 DIAGNOSIS — Z87891 Personal history of nicotine dependence: Secondary | ICD-10-CM

## 2016-03-10 DIAGNOSIS — R1310 Dysphagia, unspecified: Secondary | ICD-10-CM | POA: Diagnosis not present

## 2016-03-10 LAB — TSH: TSH: 1.64 m[IU]/L

## 2016-03-10 LAB — BUN: BUN: 16 mg/dL (ref 6–20)

## 2016-03-10 LAB — CREATININE, SERUM
CREATININE: 0.72 mg/dL (ref 0.44–1.00)
GFR calc Af Amer: >60 mL/min (ref >60)

## 2016-03-10 MED ORDER — BENZONATATE 100 MG PO CAPS: 100.0000 mg | ORAL_CAPSULE | Freq: Three times a day (TID) | ORAL | 0 refills | Status: DC | PRN

## 2016-03-10 MED ORDER — ATORVASTATIN CALCIUM 40 MG PO TABS: 40.0000 mg | ORAL_TABLET | Freq: Every day | ORAL | 0 refills | Status: DC

## 2016-03-10 MED ORDER — AZITHROMYCIN 250 MG PO TABS: ORAL_TABLET | ORAL | 0 refills | Status: DC

## 2016-03-10 MED ORDER — IOPAMIDOL (ISOVUE-370) INJECTION 76%
INTRAVENOUS | Status: AC
  Administered 2016-03-10: 100 mL
  Filled 2016-03-10: qty 100

## 2016-03-10 NOTE — Progress Notes (Signed)
[name]  MRN: [id number] DOB: Nov 10, 1950  Subjective:  [name] is a 65 y.o. female seen in office today for a chief complaint of illness x 3 weeks.   Three weeks ago pt had a head cold with sore throat, sinus congestion, nonproductive cough, and rhinorrhea.  The head cold then went away but the cough has persisted. Has associated voice change and trouble swallowing (with both liquids and foods) x 7 days.  Denies hemoptysis, fever, chills, weight loss, night sweats, acid reflux, diaphoresis, recent travel, or recent sick exposure. Pt denies history asthma, COPD, and seasonal allergies. Pt smokes 1 ppd x over 30 years.   Pt has MRI scheduled for next week. She will have lipid panel and CMP then but needs refill today for lipitor '40mg'$ . Has no complaints on this medication. She has been taking it for ten years. She is trying to watch her cholesterol intake but notes that she does not do a great job sometimes.   Pt does not speak [place]. Her son is here as her interpreter.   Review of Systems  Constitutional: Positive for fatigue.  Respiratory: Negative for chest tightness and shortness of breath.   Cardiovascular: Negative for chest pain and palpitations.  Allergic/Immunologic: Negative for environmental allergies.    Patient Active Problem List   Diagnosis Date Noted  . Sinus complaint 07/18/2011  . PTSD (post-traumatic stress disorder) 07/18/2011  . SMOKER 03/30/2010  . HTN (hypertension) 03/30/2010  . HYPERCHOLESTEROLEMIA 03/27/2010  . ARTHRITIS 03/27/2010  . CHEST PAIN 03/27/2010    Current Outpatient Prescriptions on File Prior to Visit  Medication Sig Dispense Refill  . fluticasone (FLONASE) 50 MCG/ACT nasal spray Place 2 sprays into the nose daily. 16 g 11  . hydrochlorothiazide (HYDRODIURIL) 25 MG tablet TAKE 1 TABLET BY MOUTH DAILY 90 tablet 1  . meloxicam (MOBIC) 15 MG tablet TK 1 T PO QAM WF  2  . methocarbamol (ROBAXIN) 500 MG tablet TK 1 T PO Q 8 TO 12 H PRF  SPASMS  0  . sertraline (ZOLOFT) 100 MG tablet Take 1/2 tablet daily for 3 days then take 1 tablet daily 30 tablet 3   No current facility-administered medications on file prior to visit.     Allergies  Allergen Reactions  . Penicillins    Social History   Social History  . Marital status: Married    Spouse name: N/A  . Number of children: N/A  . Years of education: N/A   Occupational History  . Not on file.   Social History Main Topics  . Smoking status: Current Every Day Smoker    Packs/day: 1.00    Years: 37.00    Types: Cigarettes  . Smokeless tobacco: Current User  . Alcohol use No  . Drug use: No  . Sexual activity: Not on file   Other Topics Concern  . Not on file   Social History Narrative  . No narrative on file   Past Medical History:  Diagnosis Date  . Arthritis   . Depression   . Hyperlipidemia   . Hypertension     Objective:  BP 110/64 (BP Location: Right Arm, Patient Position: Sitting, Cuff Size: Small)   Pulse 95   Temp 98 F (36.7 C) (Oral)   Resp 18   Ht '5\' 2"'$  (1.575 m)   Wt 149 lb (67.6 kg)   SpO2 95%   BMI 27.25 kg/m   Physical Exam  Constitutional: She is oriented to person, place, and  time and well-developed, well-nourished, and in no distress.  HENT:  Head: Normocephalic and atraumatic.  Right Ear: Tympanic membrane, external ear and ear canal normal.  Left Ear: Tympanic membrane, external ear and ear canal normal.  Nose: Nose normal.  Mouth/Throat: Uvula is midline, oropharynx is clear and moist and mucous membranes are normal.  Eyes: Conjunctivae are normal.  Neck: Trachea normal and normal range of motion.  Cardiovascular: Normal rate, regular rhythm and normal heart sounds.   Pulmonary/Chest: Effort normal. She has wheezes (one wheeze auscultated on exam, cleared with deep breathing ).  Lymphadenopathy:       Head (right side): No submental, no submandibular, no tonsillar, no preauricular, no posterior auricular and no  occipital adenopathy present.       Head (left side): No submental, no submandibular, no tonsillar, no preauricular, no posterior auricular and no occipital adenopathy present.    She has no cervical adenopathy.       Right: No supraclavicular adenopathy present.       Left: No supraclavicular adenopathy present.  Neurological: She is alert and oriented to person, place, and time. Gait normal.  Skin: Skin is warm and dry.  Psychiatric: Affect normal.  Vitals reviewed.  Dg Chest 2 View  Result Date: 03/10/2016 CLINICAL DATA:  Nonproductive cough.  Difficulty swallowing. EXAM: CHEST  2 VIEW COMPARISON:  02/20/2012 FINDINGS: Abnormal right lower paratracheal stripe, thickened the within abnormal right lateral convex margin. Abnormal increased distance between the trachea and the visible aortic arch contour. Atherosclerotic calcification of the aortic arch. Heart size normal. No bony abnormality. Airway thickening is present, suggesting bronchitis or reactive airways disease. IMPRESSION: 1. Masslike density along the anterior aortic arch. Tumor/malignancy, adenopathy, and aortic arch aneurysm are possibilities. I recommend urgent CT angiography of the chest using aortic dissection protocol to assess for tumor and/or saccular aneurysm. 2. Airway thickening is present, suggesting bronchitis or reactive airways disease. Radiology assistant personnel have been notified to put me in telephone contact with the referring physician or the referring physician's clinical representative in order to discuss these findings. Once this communication is established I will issue an addendum to this report for documentation purposes. Electronically Signed   By: Van Clines M.D.   On: 03/10/2016 16:52    Assessment and Plan :  This case was precepted with Dr. Reginia Forts  1. Other fatigue - TSH  2. Cough -Depending on results of CT angio chest, will cover for atypical organism with symptomatic support or  defer to admitting physician's treatment plan.  - DG Chest 2 View; Future  3. Dysphagia  4. Mass in chest -Pt instructed to go to [facility] immediately for CT scan. Pt's son is here to drive her to [facility]. Both the pt and son understand the importance of having the CT today. Informed them that I will call them once I receive CT results tonight and let them know the plan.  -Await results - CT Angio Chest W/Cm &/Or Wo Cm - BUN - Creatinine, serum  5. Hyperlipidemia -Pt instructed to call office with lipid panel and CMP results before receiving additional refills or she can come in and have them drawn in our office when she is fasting. - atorvastatin (LIPITOR) 40 MG tablet; Take 1 tablet (40 mg total) by mouth at bedtime.  Dispense: 90 tablet; Refill: 0  6. Tobacco use disorder 7. History of smoking 30 or more pack years -Will recommend smoking cessation at follow up.  Tenna Delaine PA-C  [facility] 03/10/2016 4:56 PM

## 2016-03-10 NOTE — Progress Notes (Signed)
Contacted with CT results. CT shows large mass, which extends throughout much of the mediastinumencasing the pulmonary arteries and portions of the thoracic aorta and aortic arch branch vessels. Findings are highly suspicious for lung carcinoma with metastatic mediastinal confluent adenopathy. Pt's son contacted and informed of findings. I will call and set up appropriate follow up for pt tomorrow morning. In the meantime, pt has been informed that they can go home for the night. I will send in prescription to cover atypical organism for bronchitis and tessalon perles. Pt instructed that if she experiences any worsening SOB, difficulty swallowing, chest pain, or other concerning symptoms to seek care immediately. Pt's son understands.

## 2016-03-10 NOTE — Patient Instructions (Addendum)
You are to go to [facility] now for your CT scan.  Enter through the ER and let them know that your there for an outpatient CT scan.    Call our office one you have lipid panel and complete metabolic panel results and I will give an additional 90 day supply for lipitor. Otherwise, you can come in when patient is fasting after her MRI and we can draw these labs so I can give an additional refill.   If you do not have to be admitted to hospital tonight, I will call in medicine for the bronchitis. If you are admitted they will address this. I will be in touch later today to let you know the plan.     IF you received an x-ray today, you will receive an invoice from [facility] Radiology. Please contact [facility] Radiology at [phone] with questions or concerns regarding your invoice.   IF you received labwork today, you will receive an invoice from [employer]. Please contact [facility] at [phone] with questions or concerns regarding your invoice.   Our billing staff will not be able to assist you with questions regarding bills from these companies.  You will be contacted with the lab results as soon as they are available. The fastest way to get your results is to activate your My Chart account. Instructions are located on the last page of this paperwork. If you have not heard from [place] regarding the results in 2 weeks, please contact this office.

## 2016-03-10 NOTE — Addendum Note (Signed)
Addended by: Tenna Delaine D on: 03/10/2016 11:35 PM   Modules accepted: Orders

## 2016-03-11 ENCOUNTER — Other Ambulatory Visit: Payer: Self-pay | Admitting: Physician Assistant

## 2016-03-11 ENCOUNTER — Telehealth: Payer: Self-pay | Admitting: *Deleted

## 2016-03-11 ENCOUNTER — Other Ambulatory Visit: Payer: Self-pay | Admitting: Medical Oncology

## 2016-03-11 DIAGNOSIS — R918 Other nonspecific abnormal finding of lung field: Secondary | ICD-10-CM

## 2016-03-11 NOTE — Telephone Encounter (Signed)
Referral phone call received from [place], [place].  Urgent referral for pulmonary mass. CT results reviewed with Navigator. Appointment scheduled with Dr. Julien Nordmann 9/29 with labs prior.   Pt will be notified of appt by referring office- she is not aware of the oncology referral at this time. Interpreter scheduled to accompany pt to appt.

## 2016-03-11 NOTE — Progress Notes (Signed)
I have set spoke with both [place] and [facility]. Both entities have also discussed this case with each other. The plan is for the pt to see Dr. Julien Nordmann at [facility] tomorrow morning at 9:15am..I have contacted pt's son and he is aware of the appointment and agrees to taking his mother to the appointment.  He was given address and contact information for this office. He had no further questions at this time. Pt instructed that if he has any questions to call our office.

## 2016-03-12 ENCOUNTER — Encounter: Payer: Self-pay | Admitting: Internal Medicine

## 2016-03-12 ENCOUNTER — Other Ambulatory Visit: Payer: Self-pay

## 2016-03-12 ENCOUNTER — Encounter: Payer: Self-pay | Admitting: Cardiothoracic Surgery

## 2016-03-12 ENCOUNTER — Encounter: Payer: Self-pay | Admitting: *Deleted

## 2016-03-12 ENCOUNTER — Encounter (HOSPITAL_COMMUNITY): Payer: Self-pay | Admitting: *Deleted

## 2016-03-12 ENCOUNTER — Telehealth: Payer: Self-pay | Admitting: Internal Medicine

## 2016-03-12 ENCOUNTER — Telehealth: Payer: Self-pay | Admitting: Medical Oncology

## 2016-03-12 ENCOUNTER — Other Ambulatory Visit (HOSPITAL_BASED_OUTPATIENT_CLINIC_OR_DEPARTMENT_OTHER): Payer: No Typology Code available for payment source

## 2016-03-12 ENCOUNTER — Ambulatory Visit (HOSPITAL_BASED_OUTPATIENT_CLINIC_OR_DEPARTMENT_OTHER): Payer: No Typology Code available for payment source | Admitting: Internal Medicine

## 2016-03-12 ENCOUNTER — Institutional Professional Consult (permissible substitution) (INDEPENDENT_AMBULATORY_CARE_PROVIDER_SITE_OTHER): Payer: No Typology Code available for payment source | Admitting: Cardiothoracic Surgery

## 2016-03-12 VITALS — BP 124/86 | HR 104 | Resp 20 | Ht 62.0 in | Wt 148.0 lb

## 2016-03-12 DIAGNOSIS — R918 Other nonspecific abnormal finding of lung field: Secondary | ICD-10-CM

## 2016-03-12 DIAGNOSIS — I10 Essential (primary) hypertension: Secondary | ICD-10-CM

## 2016-03-12 DIAGNOSIS — E785 Hyperlipidemia, unspecified: Secondary | ICD-10-CM

## 2016-03-12 DIAGNOSIS — Z720 Tobacco use: Secondary | ICD-10-CM | POA: Diagnosis not present

## 2016-03-12 HISTORY — DX: Other nonspecific abnormal finding of lung field: R91.8

## 2016-03-12 LAB — CBC WITH DIFFERENTIAL/PLATELET
BASO%: 0.2 % (ref 0.0–2.0)
Basophils Absolute: 0 10*3/uL (ref 0.0–0.1)
EOS ABS: 0.1 10*3/uL (ref 0.0–0.5)
EOS%: 1.2 % (ref 0.0–7.0)
HCT: 40.1 % (ref 34.8–46.6)
HEMOGLOBIN: 13.6 g/dL (ref 11.6–15.9)
LYMPH#: 2.4 10*3/uL (ref 0.9–3.3)
LYMPH%: 28.4 % (ref 14.0–49.7)
MCH: 29.5 pg (ref 25.1–34.0)
MCHC: 33.9 g/dL (ref 31.5–36.0)
MCV: 87 fL (ref 79.5–101.0)
MONO#: 0.7 10*3/uL (ref 0.1–0.9)
MONO%: 8.2 % (ref 0.0–14.0)
NEUT%: 62 % (ref 38.4–76.8)
NEUTROS ABS: 5.1 10*3/uL (ref 1.5–6.5)
PLATELETS: 258 10*3/uL (ref 145–400)
RBC: 4.61 10*6/uL (ref 3.70–5.45)
RDW: 13.7 % (ref 11.2–14.5)
WBC: 8.3 10*3/uL (ref 3.9–10.3)

## 2016-03-12 LAB — COMPREHENSIVE METABOLIC PANEL
ALBUMIN: 3.8 g/dL (ref 3.5–5.0)
ALK PHOS: 55 U/L (ref 40–150)
ALT: 21 U/L (ref 0–55)
ANION GAP: 13 meq/L — AB (ref 3–11)
AST: 18 U/L (ref 5–34)
BILIRUBIN TOTAL: 0.36 mg/dL (ref 0.20–1.20)
BUN: 13 mg/dL (ref 7.0–26.0)
CO2: 26 meq/L (ref 22–29)
CREATININE: 0.7 mg/dL (ref 0.6–1.1)
Calcium: 10 mg/dL (ref 8.4–10.4)
Chloride: 100 mEq/L (ref 98–109)
EGFR: 89 mL/min/{1.73_m2} — AB (ref >90)
GLUCOSE: 142 mg/dL — AB (ref 70–140)
Potassium: 3.2 mEq/L — ABNORMAL LOW (ref 3.5–5.1)
Sodium: 139 mEq/L (ref 136–145)
TOTAL PROTEIN: 7.5 g/dL (ref 6.4–8.3)

## 2016-03-12 NOTE — Telephone Encounter (Signed)
Gave patient avs report and appointments for October. [facility] radiology will call re scans.

## 2016-03-12 NOTE — Telephone Encounter (Signed)
Reviewed with patient's son am list of K+ rich foods to increase in diet including: bananas,oranges, avocado, squash, raisins, potatoes, cabbage,cauliflower, tuna,honey,beef,chicken,sardines,milk,turkey.

## 2016-03-12 NOTE — Progress Notes (Signed)
[address]       Manson,[name]             [phone]                    Philicia Buschman [place] Medical Record #[id number] Date of Birth: 28-Dec-1950  Referring: Curt Bears, MD Primary Care: [name], PA-C  Chief Complaint:    Chief Complaint  Patient presents with  . Lung Mass    Surgical eval, CTA Chest 03/10/16    History of Present Illness:    [name] 65 y.o. female is seen in the office  today for Significant mediastinal adenopathy suggestive of  carcinoma the lung. The patient is a [place] woman who does understand English presents with a two-month history of cold symptoms coming and going and a persistent cough. Recently she has also noted increasing hoarseness and some trouble swallowing. She has had no facial swelling or upper extremity swelling even in the early morning when she first gets up. She's had no hemoptysis  She is a long-term smoker more than 30 years a pack per day.    Current Activity/ Functional Status:  Patient is independent with mobility/ambulation, transfers, ADL's, IADL's.   Zubrod Score: At the time of surgery this patient's most appropriate activity status/level should be described as: '[]'$     0    Normal activity, no symptoms '[]'$     1    Restricted in physical strenuous activity but ambulatory, able to do out light work '[]'$     2    Ambulatory and capable of self care, unable to do work activities, up and about               >50 % of waking hours                              '[]'$     3    Only limited self care, in bed greater than 50% of waking hours '[]'$     4    Completely disabled, no self care, confined to bed or chair '[]'$     5    Moribund   Past Medical History:  Diagnosis Date  . Arthritis   . Depression   . Hyperlipidemia   . Hypertension   . Lung mass 03/12/2016    Past Surgical History:  Procedure Laterality Date  . ABDOMINAL HYSTERECTOMY     2009 , due to fobroids, benign path for  uterus, tumor and also cervix.     Family History  Problem Relation Age of Onset  . Cancer Father 70    Throat Cancer    Social History   Social History  . Marital status: Married    Spouse name: N/A  . Number of children: N/A  . Years of education: N/A   Occupational History  . 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   Social History Main Topics  . Smoking status: Current Every Day Smoker    Packs/day: 1.00    Years: 37.00    Types: Cigarettes  . Smokeless tobacco: Current User  . Alcohol use No  . Drug use: No  . Sexual activity: Not on file      History  Smoking Status  . Current  Every Day Smoker  . Packs/day: 1.00  . Years: 37.00  . Types: Cigarettes  Smokeless Tobacco  . Current User    History  Alcohol Use No     Allergies  Allergen Reactions  . Penicillins     Current Outpatient Prescriptions  Medication Sig Dispense Refill  . azithromycin (ZITHROMAX) 250 MG tablet Take 2 tabs PO x 1 dose, then 1 tab PO QD x 4 days 6 tablet 0  . benzonatate (TESSALON) 100 MG capsule Take 1-2 capsules (100-200 mg total) by mouth 3 (three) times daily as needed for cough. 40 capsule 0  . hydrochlorothiazide (HYDRODIURIL) 25 MG tablet TAKE 1 TABLET BY MOUTH DAILY 90 tablet 1   No current facility-administered medications for this visit.       Review of Systems:     Cardiac Review of Systems: Y or N  Chest Pain [ n   ]  Resting SOB [ n  ] Exertional SOB  [ y ]  Orthopnea [ n ]   Pedal Edema [ n  ]    Palpitations [ n ] Syncope  [ n ]   Presyncope [ n  ]  General Review of Systems: [Y] = yes [  ]=no Constitional: recent weight change [  n];  Wt loss over the last 3 months [   ] anorexia [  ]; fatigue [ y ]; nausea [  ]; night sweats [  ]; fever [ n ]; or chills [  ];          Dental: poor dentition[  ]; Last Dentist visit:   Eye :  blurred vision [  ]; diplopia [   ]; vision changes [  ];  Amaurosis fugax[  ]; Resp: cough [ y ];  wheezing[ n ];  hemoptysis[n  ]; shortness of breath[  ]; paroxysmal nocturnal dyspnea[y  ]; dyspnea on exertion[ y ]; or orthopnea[  ];  GI:  gallstones[  ], vomiting[  ];  dysphagia[  ]; melena[  ];  hematochezia [  ]; heartburn[  ];   Hx of  Colonoscopy[  ]; GU: kidney stones [  ]; hematuria[  ];   dysuria [  ];  nocturia[  ];  history of     obstruction [  ]; urinary frequency [  ]             Skin: rash, swelling[  ];, hair loss[  ];  peripheral edema[  ];  or itching[  ]; Musculosketetal: myalgias[  ];  joint swelling[  ];  joint erythema[  ];  joint pain[  ];  back pain[y surgery  ];  Heme/Lymph: bruising[  ];  bleeding[  ];  anemia[  ];  Neuro: TIA[  ];  headaches[  ];  stroke[  ];  vertigo[  ];  seizures[  ];   paresthesias[  ];  difficulty walking[  n];  Psych:depression[  ]; anxiety[  ];  Endocrine: diabetes[ n ];  thyroid dysfunction[ n ];  Immunizations: Flu up to date Florencio.Farrier ]; Pneumococcal up to date [ n ];  Other:  Physical Exam: BP 124/86 (BP Location: Left Arm, Patient Position: Sitting, Cuff Size: Small)   Pulse (!) 104   Resp 20   Ht '5\' 2"'$  (1.575 m)   Wt 148 lb (67.1 kg)   SpO2 97% Comment: RA  BMI 27.07 kg/m   PHYSICAL EXAMINATION: General appearance: alert, cooperative and no distress Head: Normocephalic, without obvious abnormality, atraumatic Neck: no  adenopathy, no carotid bruit, no JVD, supple, symmetrical, trachea midline and thyroid not enlarged, symmetric, no tenderness/mass/nodules Lymph nodes: Cervical, supraclavicular, and axillary nodes normal. Resp: clear to auscultation bilaterally Back: symmetric, no curvature. ROM normal. No CVA tenderness. Cardio: regular rate and rhythm, S1, S2 normal, no murmur, click, rub or gallop GI: soft, non-tender; bowel sounds normal; no masses,  no organomegaly Extremities: extremities normal, atraumatic, no cyanosis or edema  and Homans sign is negative, no sign of DVT Neurologic: Grossly normal Patient has no palpable mass in her neck that would be suggestive of a site to biopsy She has full DP and PT pulses bilaterally  Diagnostic Studies & Laboratory data:     Recent Radiology Findings:   Dg Chest 2 View  Addendum Date: 03/10/2016   ADDENDUM REPORT: 03/10/2016 17:10 ADDENDUM: The original report was by Dr. Van Clines. The following addendum is by Dr. Van Clines: These results were called by telephone at the time of interpretation on 03/10/2016 at 5:02 pm to Dr. Tenna Delaine , who verbally acknowledged these results. Electronically Signed   By: Van Clines M.D.   On: 03/10/2016 17:10   Result Date: 03/10/2016 CLINICAL DATA:  Nonproductive cough.  Difficulty swallowing. EXAM: CHEST  2 VIEW COMPARISON:  02/20/2012 FINDINGS: Abnormal right lower paratracheal stripe, thickened the within abnormal right lateral convex margin. Abnormal increased distance between the trachea and the visible aortic arch contour. Atherosclerotic calcification of the aortic arch. Heart size normal. No bony abnormality. Airway thickening is present, suggesting bronchitis or reactive airways disease. IMPRESSION: 1. Masslike density along the anterior aortic arch. Tumor/malignancy, adenopathy, and aortic arch aneurysm are possibilities. I recommend urgent CT angiography of the chest using aortic dissection protocol to assess for tumor and/or saccular aneurysm. 2. Airway thickening is present, suggesting bronchitis or reactive airways disease. Radiology assistant personnel have been notified to put me in telephone contact with the referring physician or the referring physician's clinical representative in order to discuss these findings. Once this communication is established I will issue an addendum to this report for documentation purposes. Electronically Signed: By: Van Clines M.D. On: 03/10/2016 16:52   Ct Angio  Chest W/cm &/or Wo Cm  Result Date: 03/10/2016 CLINICAL DATA:  persistent cough, sob, abnormal CXR: "Masslike density along the anterior aortic arch.Tumor/malignancy, adenopathy, and aortic arch aneurysm arepossibilities. I recommend urgent CT angiography of the chest usingaortic dissection protocol to assess for tumor and/or saccularAneurysm." EXAM: CT ANGIOGRAPHY CHEST WITH CONTRAST TECHNIQUE: Multidetector CT imaging of the chest was performed using the standard protocol during bolus administration of intravenous contrast. Multiplanar CT image reconstructions and MIPs were obtained to evaluate the vascular anatomy. CONTRAST:  100 mL of Isovue 370 intravenous contrast COMPARISON:  Current chest radiograph. FINDINGS: Cardiovascular: No aortic aneurysm or dissection. There is mild partly calcified plaque along the aortic arch and at the origin of the innominate and left subclavian arteries. Heart is normal in size. Mild coronary artery calcifications. Minimal pericardial effusion. There is narrowing of the pulmonary arteries from a large mediastinal mass described below. Mediastinum/Nodes: There is a large mass in the mediastinum. Mass is centered at the level of the AP window just above the Gordon. It measures approximately 7.5 x 7.4 x 9.3 cm. The mass surrounds the pulmonary vasculature and contacts a significant portion of the ascending aorta and aortic arch, surrounding the aortic arch branch vessels. The mass contacts and narrows the right left pulmonary arteries, greater involving the left. Mass extends into the right hilum  and is contiguous with a right upper lobe nodule. The right upper lobe nodule measures 2.9 x 1.7 x 2.7 cm. Lungs/Pleura: Right upper lobe nodule described above measuring 2.9 cm in greatest dimension. No other lung nodules. No evidence of pneumonia or edema. There is mild pleural parenchymal scarring at the apices. No pleural effusion or pneumothorax. Upper Abdomen: Low-density right  adrenal mass measuring 3.7 x 2.8 x 3.4 cm. Mild thickening of the left adrenal gland. No liver mass or lesion on the included field of view. Musculoskeletal: No osteoblastic or osteolytic lesions. Review of the MIP images confirms the above findings. IMPRESSION: 1. The enlargement noted on the current chest radiograph is due to a large mass, which extends throughout much of the mediastinum encasing the pulmonary arteries and portions of the thoracic aorta and aortic arch branch vessels. The mass encases and narrows the main pulmonary arteries, left greater than right. Mass is contiguous with a large nodule, borderline mass, in the right upper lobe, measuring 2.9 cm in greatest dimension. Findings are highly suspicious for lung carcinoma with metastatic mediastinal confluent adenopathy. 2. No other lung nodules. No evidence of pneumonia or pulmonary edema. Electronically Signed   By: Lajean Manes M.D.   On: 03/10/2016 20:45    There is a right adrenal mass noted on CT this appears to been present since at least 2013 on a previous CT of the chest.  I have independently reviewed the above radiology studies  and reviewed the findings with the patient.   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 I recommend to the patient that we proceed in a timely fashion with bronchoscopy with ebus and  biopsy of mediastinal nodes possible mediastinoscopy. The patient and her son have had their questions answered and she is willing to proceed we'll tentatively plan for this Monday afternoon October 2. Further evaluation with MRI of the brain and PET scan will proceed but obtain a tissue diagnosis in a timely fashion to start therapy before she develops significant compressive symptoms of the esophagus trachea and superior vena cava.   I  spent 40 minutes counseling the patient face to face and 50% or more the  time was spent in counseling and coordination of care. The total time spent in the appointment was 60 minutes.  Grace Isaac MD      [address] [place] [address] Office [phone]   Beeper [phone]  03/12/2016 1:04 PM

## 2016-03-12 NOTE — Progress Notes (Signed)
Spoke with pt's son Zeljko Prohaska for pre-op call. He states pt does not have a cardiac history and states she has not had any recent chest pain. States she does get sob with exertion

## 2016-03-12 NOTE — Progress Notes (Signed)
Oncology Nurse Navigator Documentation  Oncology Nurse Navigator Flowsheets 03/12/2016  Navigator Encounter Type Other  Treatment Phase Pre-Tx/Tx Discussion/per Dr. Julien Nordmann, he asked that I call [facility] for an urgent referral for tissue DX.  I called and spoke with Ebony Hail.  I updated Dr. Julien Nordmann.  He called and spoke with Dr. Servando Snare. Ebony Hail called to get patient demographics and asked if I would call with appt.  I called and spoke with son and gave him time and place of appt.   Barriers/Navigation Needs Coordination of Care  Interventions Coordination of Care  Coordination of Care Appts  Acuity Level 2  Acuity Level 2 Assistance expediting appointments  Time Spent with Patient 30

## 2016-03-12 NOTE — Progress Notes (Signed)
[place] [phone]   [phone]  CONSULT NOTE  REFERRING PHYSICIAN: [name], PA-C  REASON FOR CONSULTATION:  65 years old white female with highly suspicious lung cancer.  HPI [name] is a 65 y.o. female with past medical history significant for hypertension, dyslipidemia, depression and arthritis as well as long history of smoking. The patient mentioned that for the last 2 weeks she has been complaining of sore throat and runny nose as well as nonproductive cough this was followed by hoarseness of her voice. She was seen by her primary care physician and chest x-ray was performed on 03/10/2016 and it showed masslike density along the anterior aortic are. Tumor/malignancy, adenopathy and aortic arch aneurysm are possibilities. This was followed by CT angiogram of the chest on 03/10/2016 and it showed a large mass in the mediastinum. Mass is centered at the level of the AP window just above the [place]. It measures approximately 7.5 x 7.4 x 9.3 cm. The mass surrounds the pulmonary vasculature and contacts a significant portion of the ascending aorta and aortic arch, surrounding the aortic arch branch vessels. The mass contacts and narrows the right left pulmonary arteries, greater involving the left. Mass extends into the right hilum and is contiguous with a right upper lobe nodule. The right upper lobe nodule measures 2.9 x 1.7 x 2.7 cm. the scan also showed low density right adrenal mass measuring 3.7 x 2.8 x 3.4 cm. The patient was referred to me today for evaluation and recommendation regardinghis scan abnormalities. When seen today she continues to have shortness of breath with exertion as well as cough productive of clear sputum but no significant chest pain or hemoptysis. She also continues to have frontal headache around her eyes in addition to the hoarseness of her voice and dysphagia started 2 weeks ago. She denied having any  significant nausea, vomiting, diarrhea or constipation. She has no weight loss or night sweats. Family history significant for a father with throat cancer and mother still alive at age 45. The patient is married and has 3 children. She was accompanied today by her son, [name] and her interpreter. She is originally from [place]. She used to work in Education administrator. She has a history of smoking 1 pack per day for around 30 years quit 2 days ago. She has no history of alcohol or drug abuse.   HPI  Past Medical History:  Diagnosis Date  . Arthritis   . Depression   . Hyperlipidemia   . Hypertension   . Lung mass 03/12/2016    Past Surgical History:  Procedure Laterality Date  . ABDOMINAL HYSTERECTOMY     2009 , due to fobroids, benign path for uterus, tumor and also cervix.     Family History  Problem Relation Age of Onset  . Cancer Father 65    Throat Cancer    Social History Social History  Substance Use Topics  . Smoking status: Current Every Day Smoker    Packs/day: 1.00    Years: 37.00    Types: Cigarettes  . Smokeless tobacco: Current User  . Alcohol use No    Allergies  Allergen Reactions  . Penicillins     Current Outpatient Prescriptions  Medication Sig Dispense Refill  . atorvastatin (LIPITOR) 40 MG tablet Take 1 tablet (40 mg total) by mouth at bedtime. 90 tablet 0  . azithromycin (ZITHROMAX) 250 MG tablet Take 2 tabs PO x 1 dose, then 1 tab PO QD  x 4 days 6 tablet 0  . benzonatate (TESSALON) 100 MG capsule Take 1-2 capsules (100-200 mg total) by mouth 3 (three) times daily as needed for cough. 40 capsule 0  . fluticasone (FLONASE) 50 MCG/ACT nasal spray Place 2 sprays into the nose daily. 16 g 11  . hydrochlorothiazide (HYDRODIURIL) 25 MG tablet TAKE 1 TABLET BY MOUTH DAILY 90 tablet 1  . meloxicam (MOBIC) 15 MG tablet TK 1 T PO QAM WF  2  . methocarbamol (ROBAXIN) 500 MG tablet TK 1 T PO Q 8 TO 12 H PRF SPASMS  0  . sertraline (ZOLOFT) 100 MG tablet Take 1/2  tablet daily for 3 days then take 1 tablet daily (Patient not taking: Reported on 03/12/2016) 30 tablet 3   No current facility-administered medications for this visit.     Review of Systems  Constitutional: negative Eyes: negative Ears, nose, mouth, throat, and face: positive for hoarseness, sore throat and voice change Respiratory: positive for cough, dyspnea on exertion and sputum Cardiovascular: negative Gastrointestinal: positive for dysphagia Genitourinary:negative Integument/breast: negative Hematologic/lymphatic: negative Musculoskeletal:negative Neurological: negative Behavioral/Psych: negative Endocrine: negative Allergic/Immunologic: negative  Physical Exam  HKV:QQVZD, healthy, no distress, well nourished and well developed SKIN: skin color, texture, turgor are normal, no rashes or significant lesions HEAD: Normocephalic, No masses, lesions, tenderness or abnormalities EYES: normal, PERRLA, Conjunctiva are pink and non-injected EARS: External ears normal, Canals clear OROPHARYNX:no exudate, no erythema and lips, buccal mucosa, and tongue normal  NECK: supple, no adenopathy, no JVD LYMPH:  no palpable lymphadenopathy, no hepatosplenomegaly BREAST:not examined LUNGS: Bilateral wheezes. HEART: regular rate & rhythm, no murmurs and no gallops ABDOMEN:abdomen soft, non-tender, normal bowel sounds and no masses or organomegaly BACK: Back symmetric, no curvature., No CVA tenderness EXTREMITIES:no joint deformities, effusion, or inflammation, no edema, no skin discoloration  NEURO: alert & oriented x 3 with fluent speech, no focal motor/sensory deficits  PERFORMANCE STATUS: ECOG 1  LABORATORY DATA: Lab Results  Component Value Date   WBC 8.3 03/12/2016   HGB 13.6 03/12/2016   HCT 40.1 03/12/2016   MCV 87.0 03/12/2016   PLT 258 03/12/2016      Chemistry      Component Value Date/Time   NA 139 03/12/2016 0920   K 3.2 (L) 03/12/2016 0920   CL 101 06/02/2015  0904   CO2 26 03/12/2016 0920   BUN 13.0 03/12/2016 0920   CREATININE 0.7 03/12/2016 0920      Component Value Date/Time   CALCIUM 10.0 03/12/2016 0920   ALKPHOS 55 03/12/2016 0920   AST 18 03/12/2016 0920   ALT 21 03/12/2016 0920   BILITOT 0.36 03/12/2016 0920       RADIOGRAPHIC STUDIES: Dg Chest 2 View  Addendum Date: 03/10/2016   ADDENDUM REPORT: 03/10/2016 17:10 ADDENDUM: The original report was by Dr. Van Clines. The following addendum is by Dr. Van Clines: These results were called by telephone at the time of interpretation on 03/10/2016 at 5:02 pm to Dr. Tenna Delaine , who verbally acknowledged these results. Electronically Signed   By: Van Clines M.D.   On: 03/10/2016 17:10   Result Date: 03/10/2016 CLINICAL DATA:  Nonproductive cough.  Difficulty swallowing. EXAM: CHEST  2 VIEW COMPARISON:  02/20/2012 FINDINGS: Abnormal right lower paratracheal stripe, thickened the within abnormal right lateral convex margin. Abnormal increased distance between the trachea and the visible aortic arch contour. Atherosclerotic calcification of the aortic arch. Heart size normal. No bony abnormality. Airway thickening is present, suggesting bronchitis or reactive  airways disease. IMPRESSION: 1. Masslike density along the anterior aortic arch. Tumor/malignancy, adenopathy, and aortic arch aneurysm are possibilities. I recommend urgent CT angiography of the chest using aortic dissection protocol to assess for tumor and/or saccular aneurysm. 2. Airway thickening is present, suggesting bronchitis or reactive airways disease. Radiology assistant personnel have been notified to put me in telephone contact with the referring physician or the referring physician's clinical representative in order to discuss these findings. Once this communication is established I will issue an addendum to this report for documentation purposes. Electronically Signed: By: Van Clines M.D. On:  03/10/2016 16:52   Ct Angio Chest W/cm &/or Wo Cm  Result Date: 03/10/2016 CLINICAL DATA:  persistent cough, sob, abnormal CXR: "Masslike density along the anterior aortic arch.Tumor/malignancy, adenopathy, and aortic arch aneurysm arepossibilities. I recommend urgent CT angiography of the chest usingaortic dissection protocol to assess for tumor and/or saccularAneurysm." EXAM: CT ANGIOGRAPHY CHEST WITH CONTRAST TECHNIQUE: Multidetector CT imaging of the chest was performed using the standard protocol during bolus administration of intravenous contrast. Multiplanar CT image reconstructions and MIPs were obtained to evaluate the vascular anatomy. CONTRAST:  100 mL of Isovue 370 intravenous contrast COMPARISON:  Current chest radiograph. FINDINGS: Cardiovascular: No aortic aneurysm or dissection. There is mild partly calcified plaque along the aortic arch and at the origin of the innominate and left subclavian arteries. Heart is normal in size. Mild coronary artery calcifications. Minimal pericardial effusion. There is narrowing of the pulmonary arteries from a large mediastinal mass described below. Mediastinum/Nodes: There is a large mass in the mediastinum. Mass is centered at the level of the AP window just above the [place]. It measures approximately 7.5 x 7.4 x 9.3 cm. The mass surrounds the pulmonary vasculature and contacts a significant portion of the ascending aorta and aortic arch, surrounding the aortic arch branch vessels. The mass contacts and narrows the right left pulmonary arteries, greater involving the left. Mass extends into the right hilum and is contiguous with a right upper lobe nodule. The right upper lobe nodule measures 2.9 x 1.7 x 2.7 cm. Lungs/Pleura: Right upper lobe nodule described above measuring 2.9 cm in greatest dimension. No other lung nodules. No evidence of pneumonia or edema. There is mild pleural parenchymal scarring at the apices. No pleural effusion or pneumothorax. Upper  Abdomen: Low-density right adrenal mass measuring 3.7 x 2.8 x 3.4 cm. Mild thickening of the left adrenal gland. No liver mass or lesion on the included field of view. Musculoskeletal: No osteoblastic or osteolytic lesions. Review of the MIP images confirms the above findings. IMPRESSION: 1. The enlargement noted on the current chest radiograph is due to a large mass, which extends throughout much of the mediastinum encasing the pulmonary arteries and portions of the thoracic aorta and aortic arch branch vessels. The mass encases and narrows the main pulmonary arteries, left greater than right. Mass is contiguous with a large nodule, borderline mass, in the right upper lobe, measuring 2.9 cm in greatest dimension. Findings are highly suspicious for lung carcinoma with metastatic mediastinal confluent adenopathy. 2. No other lung nodules. No evidence of pneumonia or pulmonary edema. Electronically Signed   By: Lajean Manes M.D.   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   PLAN: I had a  lengthy discussion with the patient and her family today about her current disease status and further investigation to confirm her diagnosis as well as potential treatment options. I recommended for the patient to complete the staging workup by ordering a PET scan as well as MRI of the brain to rule out any other metastatic disease. I also referred the patient to Dr. Servando Snare for consideration of bronchoscopy, endobronchial ultrasound plus/minus mediastinoscopy for tissue diagnosis. If the final diagnosis is consistent with a small cell lung cancer, I will arrange for the patient follow-up visit soon for evaluation and discussion of her treatment options. I will also make a referral to radiation  oncology for evaluation and consideration of a short course of palliative radiotherapy to the large mediastinal mass. The patient was advised to call immediately if she has any concerning symptoms in the interval. For smoke cessation, I strongly encouraged her to quit smoking and offered her to smoke cessation program.  The patient voices understanding of current disease status and treatment options and is in agreement with the current care plan.  All questions were answered. The patient knows to call the clinic with any problems, questions or concerns. We can certainly see the patient much sooner if necessary.  Thank you so much for allowing me to participate in the care of [name]. I will continue to follow up the patient with you and assist in her care.  I spent 40 minutes counseling the patient face to face. The total time spent in the appointment was 60 minutes.  Disclaimer: This note was dictated with voice recognition software. Similar sounding words can inadvertently be transcribed and may not be corrected upon review.   Macauley Mossberg K. March 12, 2016, 10:33 AM

## 2016-03-14 MED ORDER — VANCOMYCIN HCL 10 G IV SOLR
1250.0000 mg | INTRAVENOUS | Status: AC
  Administered 2016-03-15: 1250 mg via INTRAVENOUS
  Filled 2016-03-14: qty 1250

## 2016-03-15 ENCOUNTER — Encounter (HOSPITAL_COMMUNITY): Payer: Self-pay | Admitting: *Deleted

## 2016-03-15 ENCOUNTER — Ambulatory Visit (HOSPITAL_COMMUNITY): Payer: No Typology Code available for payment source

## 2016-03-15 ENCOUNTER — Ambulatory Visit (HOSPITAL_COMMUNITY): Payer: No Typology Code available for payment source | Admitting: Anesthesiology

## 2016-03-15 ENCOUNTER — Ambulatory Visit (HOSPITAL_COMMUNITY)
Admission: RE | Admit: 2016-03-15 | Discharge: 2016-03-15 | Disposition: A | Discharge status: Home / Self Care | Payer: No Typology Code available for payment source | Source: Ambulatory Visit | Attending: Cardiothoracic Surgery | Admitting: Cardiothoracic Surgery

## 2016-03-15 ENCOUNTER — Encounter (HOSPITAL_COMMUNITY)
Admission: RE | Disposition: A | Discharge status: Home / Self Care | Payer: Self-pay | Source: Ambulatory Visit | Attending: Cardiothoracic Surgery

## 2016-03-15 DIAGNOSIS — Z87891 Personal history of nicotine dependence: Secondary | ICD-10-CM | POA: Diagnosis not present

## 2016-03-15 DIAGNOSIS — R0602 Shortness of breath: Secondary | ICD-10-CM | POA: Diagnosis not present

## 2016-03-15 DIAGNOSIS — Z01818 Encounter for other preprocedural examination: Secondary | ICD-10-CM

## 2016-03-15 DIAGNOSIS — C3491 Malignant neoplasm of unspecified part of right bronchus or lung: Secondary | ICD-10-CM | POA: Diagnosis not present

## 2016-03-15 DIAGNOSIS — R590 Localized enlarged lymph nodes: Secondary | ICD-10-CM | POA: Diagnosis not present

## 2016-03-15 DIAGNOSIS — M1990 Unspecified osteoarthritis, unspecified site: Secondary | ICD-10-CM | POA: Insufficient documentation

## 2016-03-15 DIAGNOSIS — F329 Major depressive disorder, single episode, unspecified: Secondary | ICD-10-CM | POA: Diagnosis not present

## 2016-03-15 DIAGNOSIS — I10 Essential (primary) hypertension: Secondary | ICD-10-CM | POA: Diagnosis not present

## 2016-03-15 HISTORY — DX: Other specified postprocedural states: Z98.890

## 2016-03-15 HISTORY — DX: Headache, unspecified: R51.9

## 2016-03-15 HISTORY — DX: Nausea with vomiting, unspecified: R11.2

## 2016-03-15 HISTORY — DX: Reserved for inherently not codable concepts without codable children: IMO0001

## 2016-03-15 HISTORY — DX: Headache: R51

## 2016-03-15 HISTORY — PX: VIDEO BRONCHOSCOPY WITH ENDOBRONCHIAL ULTRASOUND: SHX6177

## 2016-03-15 HISTORY — DX: Anemia, unspecified: D64.9

## 2016-03-15 LAB — COMPREHENSIVE METABOLIC PANEL
ALT: 19 U/L (ref 14–54)
AST: 22 U/L (ref 15–41)
Albumin: 4.1 g/dL (ref 3.5–5.0)
Alkaline Phosphatase: 48 U/L (ref 38–126)
Anion gap: 10 (ref 5–15)
BUN: 13 mg/dL (ref 6–20)
CO2: 27 mmol/L (ref 22–32)
Calcium: 9.7 mg/dL (ref 8.9–10.3)
Chloride: 98 mmol/L — ABNORMAL LOW (ref 101–111)
Creatinine, Ser: 0.59 mg/dL (ref 0.44–1.00)
GFR calc Af Amer: >60 mL/min (ref >60)
GFR calc non Af Amer: >60 mL/min (ref >60)
Glucose, Bld: 96 mg/dL (ref 65–99)
Potassium: 3.1 mmol/L — ABNORMAL LOW (ref 3.5–5.1)
Sodium: 135 mmol/L (ref 135–145)
Total Bilirubin: 0.5 mg/dL (ref 0.3–1.2)
Total Protein: 7.1 g/dL (ref 6.5–8.1)

## 2016-03-15 LAB — ABO/RH: ABO/RH(D): AB NEG

## 2016-03-15 LAB — PROTIME-INR
INR: 1
Prothrombin Time: 13.2 seconds (ref 11.4–15.2)

## 2016-03-15 LAB — TYPE AND SCREEN
ABO/RH(D): AB NEG
Antibody Screen: NEGATIVE

## 2016-03-15 LAB — APTT: aPTT: 31 seconds (ref 24–36)

## 2016-03-15 SURGERY — BRONCHOSCOPY, WITH EBUS
Anesthesia: General

## 2016-03-15 MED ORDER — CHLORHEXIDINE GLUCONATE CLOTH 2 % EX PADS: 6.0000 | MEDICATED_PAD | Freq: Once | CUTANEOUS | Status: DC

## 2016-03-15 MED ORDER — SUFENTANIL CITRATE 50 MCG/ML IV SOLN
INTRAVENOUS | Status: DC | PRN
  Administered 2016-03-15 (×2): 10 ug via INTRAVENOUS

## 2016-03-15 MED ORDER — LIDOCAINE 2% (20 MG/ML) 5 ML SYRINGE
INTRAMUSCULAR | Status: AC
  Filled 2016-03-15: qty 5

## 2016-03-15 MED ORDER — MIDAZOLAM HCL 5 MG/5ML IJ SOLN
INTRAMUSCULAR | Status: DC | PRN
Start: 2016-03-15 — End: 2016-03-15
  Administered 2016-03-15: 1 mg via INTRAVENOUS

## 2016-03-15 MED ORDER — ONDANSETRON HCL 4 MG/2ML IJ SOLN
INTRAMUSCULAR | Status: AC
  Filled 2016-03-15: qty 2

## 2016-03-15 MED ORDER — ROCURONIUM BROMIDE 10 MG/ML (PF) SYRINGE
PREFILLED_SYRINGE | INTRAVENOUS | Status: AC
  Filled 2016-03-15: qty 10

## 2016-03-15 MED ORDER — DEXAMETHASONE SODIUM PHOSPHATE 10 MG/ML IJ SOLN
INTRAMUSCULAR | Status: AC
  Filled 2016-03-15: qty 1

## 2016-03-15 MED ORDER — PHENYLEPHRINE HCL 10 MG/ML IJ SOLN
INTRAVENOUS | Status: DC | PRN
  Administered 2016-03-15: 40 ug/min via INTRAVENOUS

## 2016-03-15 MED ORDER — PROPOFOL 10 MG/ML IV BOLUS
INTRAVENOUS | Status: DC | PRN
  Administered 2016-03-15: 100 mg via INTRAVENOUS

## 2016-03-15 MED ORDER — PHENYLEPHRINE 40 MCG/ML (10ML) SYRINGE FOR IV PUSH (FOR BLOOD PRESSURE SUPPORT)
PREFILLED_SYRINGE | INTRAVENOUS | Status: DC | PRN
  Administered 2016-03-15: 40 ug via INTRAVENOUS
  Administered 2016-03-15: 80 ug via INTRAVENOUS

## 2016-03-15 MED ORDER — SUGAMMADEX SODIUM 200 MG/2ML IV SOLN
INTRAVENOUS | Status: DC | PRN
  Administered 2016-03-15: 135 mg via INTRAVENOUS

## 2016-03-15 MED ORDER — SUGAMMADEX SODIUM 200 MG/2ML IV SOLN
INTRAVENOUS | Status: AC
  Filled 2016-03-15: qty 2

## 2016-03-15 MED ORDER — MIDAZOLAM HCL 2 MG/2ML IJ SOLN
INTRAMUSCULAR | Status: AC
  Filled 2016-03-15: qty 2

## 2016-03-15 MED ORDER — EPINEPHRINE HCL 1 MG/ML IJ SOLN
INTRAMUSCULAR | Status: AC
  Filled 2016-03-15: qty 1

## 2016-03-15 MED ORDER — LACTATED RINGERS IV SOLN
INTRAVENOUS | Status: DC
  Administered 2016-03-15: 15:00:00 via INTRAVENOUS

## 2016-03-15 MED ORDER — 0.9 % SODIUM CHLORIDE (POUR BTL) OPTIME
TOPICAL | Status: DC | PRN
  Administered 2016-03-15: 1000 mL

## 2016-03-15 MED ORDER — FENTANYL CITRATE (PF) 100 MCG/2ML IJ SOLN: 25.0000 ug | INTRAMUSCULAR | Status: DC | PRN

## 2016-03-15 MED ORDER — SUFENTANIL CITRATE 50 MCG/ML IV SOLN
INTRAVENOUS | Status: AC
  Filled 2016-03-15: qty 1

## 2016-03-15 MED ORDER — SODIUM CHLORIDE 0.9 % IJ SOLN
INTRAMUSCULAR | Status: AC
  Filled 2016-03-15: qty 10

## 2016-03-15 MED ORDER — ONDANSETRON HCL 4 MG/2ML IJ SOLN
INTRAMUSCULAR | Status: DC | PRN
  Administered 2016-03-15: 4 mg via INTRAVENOUS

## 2016-03-15 MED ORDER — DEXAMETHASONE SODIUM PHOSPHATE 10 MG/ML IJ SOLN
INTRAMUSCULAR | Status: DC | PRN
  Administered 2016-03-15: 10 mg via INTRAVENOUS

## 2016-03-15 MED ORDER — LIDOCAINE 2% (20 MG/ML) 5 ML SYRINGE
INTRAMUSCULAR | Status: DC | PRN
  Administered 2016-03-15: 60 mg via INTRAVENOUS

## 2016-03-15 MED ORDER — ROCURONIUM BROMIDE 10 MG/ML (PF) SYRINGE
PREFILLED_SYRINGE | INTRAVENOUS | Status: DC | PRN
  Administered 2016-03-15: 40 mg via INTRAVENOUS

## 2016-03-15 SURGICAL SUPPLY — 50 items
ADH SKN CLS APL DERMABOND .7 (GAUZE/BANDAGES/DRESSINGS) ×1
BLADE SURG 10 STRL SS (BLADE) IMPLANT
BRUSH CYTOL CELLEBRITY 1.5X140 (MISCELLANEOUS) IMPLANT
CANISTER SUCTION 2500CC (MISCELLANEOUS) ×2 IMPLANT
CLIP TI MEDIUM 6 (CLIP) IMPLANT
CONT SPEC 4OZ CLIKSEAL STRL BL (MISCELLANEOUS) ×6 IMPLANT
COVER DOME SNAP 22 D (MISCELLANEOUS) ×2 IMPLANT
COVER SURGICAL LIGHT HANDLE (MISCELLANEOUS) IMPLANT
COVER TABLE BACK 60X90 (DRAPES) ×2 IMPLANT
DERMABOND ADVANCED (GAUZE/BANDAGES/DRESSINGS) ×1
DERMABOND ADVANCED .7 DNX12 (GAUZE/BANDAGES/DRESSINGS) ×1 IMPLANT
DRAPE LAPAROTOMY T 102X78X121 (DRAPES) IMPLANT
DRSG AQUACEL AG ADV 3.5X14 (GAUZE/BANDAGES/DRESSINGS) IMPLANT
ELECT CAUTERY BLADE 6.4 (BLADE) IMPLANT
ELECT REM PT RETURN 9FT ADLT (ELECTROSURGICAL)
ELECTRODE REM PT RTRN 9FT ADLT (ELECTROSURGICAL) IMPLANT
FORCEPS BIOP RJ4 1.8 (CUTTING FORCEPS) IMPLANT
GAUZE SPONGE 4X4 12PLY STRL (GAUZE/BANDAGES/DRESSINGS) IMPLANT
GAUZE SPONGE 4X4 16PLY XRAY LF (GAUZE/BANDAGES/DRESSINGS) ×2 IMPLANT
GLOVE BIO SURGEON STRL SZ 6.5 (GLOVE) ×4 IMPLANT
GOWN STRL REUS W/ TWL LRG LVL3 (GOWN DISPOSABLE) ×2 IMPLANT
GOWN STRL REUS W/TWL LRG LVL3 (GOWN DISPOSABLE) ×4
HEMOSTAT SURGICEL 2X14 (HEMOSTASIS) IMPLANT
KIT BASIN OR (CUSTOM PROCEDURE TRAY) IMPLANT
KIT CLEAN ENDO COMPLIANCE (KITS) ×4 IMPLANT
KIT ROOM TURNOVER OR (KITS) ×2 IMPLANT
MARKER SKIN DUAL TIP RULER LAB (MISCELLANEOUS) IMPLANT
NEEDLE BIOPSY TRANSBRONCH 21G (NEEDLE) IMPLANT
NEEDLE BLUNT 18X1 FOR OR ONLY (NEEDLE) IMPLANT
NEEDLE EBUS SONO TIP PENTAX (NEEDLE) ×2 IMPLANT
NS IRRIG 1000ML POUR BTL (IV SOLUTION) IMPLANT
OIL SILICONE PENTAX (PARTS (SERVICE/REPAIRS)) ×2 IMPLANT
PACK SURGICAL SETUP 50X90 (CUSTOM PROCEDURE TRAY) ×2 IMPLANT
PAD ARMBOARD 7.5X6 YLW CONV (MISCELLANEOUS) ×4 IMPLANT
PENCIL BUTTON HOLSTER BLD 10FT (ELECTRODE) IMPLANT
SPONGE INTESTINAL PEANUT (DISPOSABLE) IMPLANT
STAPLER VISISTAT 35W (STAPLE) IMPLANT
SUT VIC AB 3-0 SH 18 (SUTURE) IMPLANT
SUT VICRYL 4-0 PS2 18IN ABS (SUTURE) IMPLANT
SWAB COLLECTION DEVICE MRSA (MISCELLANEOUS) IMPLANT
SYR 20CC LL (SYRINGE) IMPLANT
SYR 20ML ECCENTRIC (SYRINGE) IMPLANT
SYRINGE 10CC LL (SYRINGE) ×2 IMPLANT
TOWEL OR 17X24 6PK STRL BLUE (TOWEL DISPOSABLE) IMPLANT
TOWEL OR 17X26 10 PK STRL BLUE (TOWEL DISPOSABLE) ×2 IMPLANT
TRAP SPECIMEN MUCOUS 40CC (MISCELLANEOUS) ×2 IMPLANT
TUBE ANAEROBIC SPECIMEN COL (MISCELLANEOUS) IMPLANT
TUBE CONNECTING 12X1/4 (SUCTIONS) ×2 IMPLANT
TUBE CONNECTING 20X1/4 (TUBING) ×2 IMPLANT
WATER STERILE IRR 1000ML POUR (IV SOLUTION) ×2 IMPLANT

## 2016-03-15 NOTE — Anesthesia Postprocedure Evaluation (Signed)
Anesthesia Post Note  Patient: [name]  Procedure(s) Performed: Procedure(s) (LRB): VIDEO BRONCHOSCOPY WITH ENDOBRONCHIAL ULTRASOUND AND BIOPSY OF LEVEL SEVEN LYMPH NODE (N/A)  Patient location during evaluation: PACU Anesthesia Type: General Level of consciousness: awake and alert Pain management: pain level controlled Vital Signs Assessment: post-procedure vital signs reviewed and stable Respiratory status: spontaneous breathing, nonlabored ventilation, respiratory function stable and patient connected to nasal cannula oxygen Cardiovascular status: blood pressure returned to baseline and stable Postop Assessment: no signs of nausea or vomiting Anesthetic complications: no    Last Vitals:  Vitals:   03/15/16 1321 03/15/16 1630  BP: (!) 152/81 (!) 145/73  Pulse: 96 100  Resp: 20 17  Temp: 36.6 C 36.7 C    Last Pain:  Vitals:   03/15/16 1630  TempSrc:   PainSc: 0-No pain                 Kadedra Vanaken,W. EDMOND

## 2016-03-15 NOTE — Progress Notes (Signed)
[name] from language resources was present during interview and read consent to patient prior to signing.

## 2016-03-15 NOTE — Discharge Instructions (Signed)
Flexible Bronchoscopy, Care After Refer to this sheet in the next few weeks. These instructions provide you with information on caring for yourself after your procedure. Your health care provider may also give you more specific instructions. Your treatment has been planned according to current medical practices, but problems sometimes occur. Call your health care provider if you have any problems or questions after your procedure.  WHAT TO EXPECT AFTER THE PROCEDURE It is normal to have the following symptoms for 24-48 hours after the procedure:   Increased cough.  Low-grade fever.  Sore throat or hoarse voice.  Small streaks of blood in your thick spit (sputum) if tissue samples were taken (biopsy). HOME CARE INSTRUCTIONS   Do not eat or drink anything for 2 hours after your procedure. Your nose and throat were numbed by medicine. If you try to eat or drink before the medicine wears off, food or drink could go into your lungs or you could burn yourself. After the numbness is gone and your cough and gag reflexes have returned, you may eat soft food and drink liquids slowly.   The day after the procedure, you can go back to your normal diet.   You may resume normal activities.   Keep all follow-up visits as directed by your health care provider. It is important to keep all your appointments, especially if tissue samples were taken for testing (biopsy). SEEK IMMEDIATE MEDICAL CARE IF:   You have increasing shortness of breath.   You become light-headed or faint.   You have chest pain.   You have any new concerning symptoms.  You cough up more than a small amount of blood.  The amount of blood you cough up increases. MAKE SURE YOU:  Understand these instructions.  Will watch your condition.  Will get help right away if you are not doing well or get worse.   This information is not intended to replace advice given to you by your health care provider. Make sure you discuss  any questions you have with your health care provider.   Document Released: 12/18/2004 Document Revised: 06/21/2014 Document Reviewed: 02/02/2013 Elsevier Interactive Patient Education [employer].

## 2016-03-15 NOTE — Brief Op Note (Signed)
      [address]       [place] [address]             [phone]      03/15/2016  4:24 PM  PATIENT:  [name]  65 y.o. female  PRE-OPERATIVE DIAGNOSIS:  LUNG MASS  POST-OPERATIVE DIAGNOSIS:  lung mass  PROCEDURE:  Procedure(s): VIDEO BRONCHOSCOPY WITH ENDOBRONCHIAL ULTRASOUND (N/A) Biopsy of mediastinal nodes   SURGEON:  Surgeon(s) and Role:    * Grace Isaac, MD - Primary   ANESTHESIA:   general  EBL:  Total I/O In: 600 [I.V.:600] Out: -   BLOOD ADMINISTERED:none  DRAINS: none   LOCAL MEDICATIONS USED:  NONE  SPECIMEN:  Source of Specimen:  # 7 mediastinal  nodes   DISPOSITION OF SPECIMEN:  PATHOLOGY  COUNTS:  YES   DICTATION: .Dragon Dictation  PLAN OF CARE: Discharge to home after PACU  PATIENT DISPOSITION:  PACU - hemodynamically stable.   Delay start of Pharmacological VTE agent (>24hrs) due to surgical blood loss or risk of bleeding: yes

## 2016-03-15 NOTE — Anesthesia Procedure Notes (Signed)
Procedure Name: Intubation Date/Time: 03/15/2016 3:29 PM Performed by: Melina Copa, Vaishnavi Dalby R Pre-anesthesia Checklist: Patient identified, Emergency Drugs available, Suction available and Patient being monitored Patient Re-evaluated:Patient Re-evaluated prior to inductionOxygen Delivery Method: Circle System Utilized Preoxygenation: Pre-oxygenation with 100% oxygen Intubation Type: IV induction Ventilation: Mask ventilation without difficulty Laryngoscope Size: Mac and 3 Grade View: Grade II Tube type: Oral Tube size: 9.0 mm Number of attempts: 1 Airway Equipment and Method: Stylet Placement Confirmation: ETT inserted through vocal cords under direct vision,  positive ETCO2 and breath sounds checked- equal and bilateral Secured at: 20 cm Tube secured with: Tape Dental Injury: Teeth and Oropharynx as per pre-operative assessment

## 2016-03-15 NOTE — Transfer of Care (Signed)
Immediate Anesthesia Transfer of Care Note  Patient: [name]  Procedure(s) Performed: Procedure(s): VIDEO BRONCHOSCOPY WITH ENDOBRONCHIAL ULTRASOUND AND BIOPSY OF LEVEL SEVEN LYMPH NODE (N/A)  Patient Location: PACU  Anesthesia Type:General  Level of Consciousness: awake, oriented and patient cooperative  Airway & Oxygen Therapy: Patient Spontanous Breathing and Patient connected to nasal cannula oxygen  Post-op Assessment: Report given to RN, Post -op Vital signs reviewed and stable and Patient moving all extremities  Post vital signs: Reviewed and stable  Last Vitals:  Vitals:   03/15/16 1321  BP: (!) 152/81  Pulse: 96  Resp: 20  Temp: 36.6 C    Last Pain:  Vitals:   03/15/16 1321  TempSrc: Oral      Patients Stated Pain Goal: 3 (74/14/23 9532)  Complications: No apparent anesthesia complications

## 2016-03-15 NOTE — Anesthesia Preprocedure Evaluation (Addendum)
Anesthesia Evaluation  Patient identified by MRN, date of birth, ID band Patient awake    Reviewed: Allergy & Precautions, H&P , NPO status , Patient's Chart, lab work & pertinent test results  History of Anesthesia Complications (+) PONV  Airway Mallampati: II  TM Distance: >3 FB Neck ROM: Full    Dental no notable dental hx. (+) Upper Dentures, Lower Dentures, Dental Advisory Given   Pulmonary shortness of breath and with exertion, former smoker,  Lung mass   Pulmonary exam normal breath sounds clear to auscultation       Cardiovascular hypertension, Pt. on medications  Rhythm:Regular Rate:Normal     Neuro/Psych  Headaches, Depression    GI/Hepatic negative GI ROS, Neg liver ROS,   Endo/Other  negative endocrine ROS  Renal/GU negative Renal ROS  negative genitourinary   Musculoskeletal  (+) Arthritis , Osteoarthritis,    Abdominal   Peds  Hematology negative hematology ROS (+)   Anesthesia Other Findings   Reproductive/Obstetrics negative OB ROS                            Anesthesia Physical Anesthesia Plan  ASA: III  Anesthesia Plan: General   Post-op Pain Management:    Induction: Intravenous  Airway Management Planned: Oral ETT  Additional Equipment:   Intra-op Plan:   Post-operative Plan: Extubation in OR  Informed Consent: I have reviewed the patients History and Physical, chart, labs and discussed the procedure including the risks, benefits and alternatives for the proposed anesthesia with the patient or authorized representative who has indicated his/her understanding and acceptance.   Dental advisory given  Plan Discussed with: CRNA, Anesthesiologist and Surgeon  Anesthesia Plan Comments:        Anesthesia Quick Evaluation

## 2016-03-15 NOTE — H&P (Signed)
[address]       [place] [address]             [phone]                    [name] [place] Medical Record #[id number] Date of Birth: [birth year]  Referring: Dr Mayme Genta  Primary Care: [name], PA-C  Chief Complaint:    Lung mass    History of Present Illness:    [name] 65 y.o. female is seen in the office for Significant mediastinal adenopathy suggestive of  carcinoma the lung. The patient is a [place] woman who does understand English presents with a two-month history of cold symptoms coming and going and a persistent cough. Recently she has also noted increasing hoarseness and some trouble swallowing. She has had no facial swelling or upper extremity swelling even in the early morning when she first gets up. She's had no hemoptysis  She is a long-term smoker more than 30 years a pack per day.    Current Activity/ Functional Status:  Patient is independent with mobility/ambulation, transfers, ADL's, IADL's.   Zubrod Score: At the time of surgery this patient's most appropriate activity status/level should be described as: '[]'$     0    Normal activity, no symptoms '[x]'$     1    Restricted in physical strenuous activity but ambulatory, able to do out light work '[]'$     2    Ambulatory and capable of self care, unable to do work activities, up and about               >50 % of waking hours                              '[]'$     3    Only limited self care, in bed greater than 50% of waking hours '[]'$     4    Completely disabled, no self care, confined to bed or chair '[]'$     5    Moribund   Past Medical History:  Diagnosis Date  . Anemia    in the past  . Arthritis   . Depression   . Headache   . Hyperlipidemia   . Hypertension   . Lung mass 03/12/2016  . PONV (postoperative nausea and vomiting)    after hysterectomy  . Shortness of breath dyspnea    with exertion    Past Surgical History:  Procedure Laterality Date  .  ABDOMINAL HYSTERECTOMY     2009 , due to fobroids, benign path for uterus, tumor and also cervix.   Marland Kitchen BACK SURGERY     lower back  . COLONOSCOPY      Family History  Problem Relation Age of Onset  . Cancer Father 29    Throat Cancer    Social History   Social History  . Marital status: Married    Spouse name: N/A  . Number of children: N/A  . Years of education: N/A   Occupational History  . 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   Social History Main Topics  . Smoking status: Current Every Day Smoker    Packs/day: 1.00    Years: 37.00    Types: Cigarettes  .  Smokeless tobacco: Current User  . Alcohol use No  . Drug use: No  . Sexual activity: Not on file      History  Smoking Status  . Former Smoker  . Packs/day: 1.00  . Years: 37.00  . Types: Cigarettes  . Quit date: 03/10/2016  Smokeless Tobacco  . Never Used    History  Alcohol Use No     Allergies  Allergen Reactions  . Penicillins Swelling and Rash    Current Facility-Administered Medications  Medication Dose Route Frequency Provider Last Rate Last Dose  . Chlorhexidine Gluconate Cloth 2 % PADS 6 each  6 each Topical Once Grace Isaac, MD      . lactated ringers infusion   Intravenous Continuous Roderic Palau, MD 10 mL/hr at 03/15/16 1438    . vancomycin (VANCOCIN) 1,250 mg in sodium chloride 0.9 % 250 mL IVPB  1,250 mg Intravenous On Call to OR Grace Isaac, MD          Review of Systems:     Cardiac Review of Systems: Y or N  Chest Pain [ n   ]  Resting SOB [ n  ] Exertional SOB  [ y ]  Orthopnea [ n ]   Pedal Edema [ n  ]    Palpitations [ n ] Syncope  [ n ]   Presyncope [ n  ]  General Review of Systems: [Y] = yes [  ]=no Constitional: recent weight change [  n];  Wt loss over the last 3 months [   ] anorexia [  ]; fatigue [ y ]; nausea [  ];  night sweats [  ]; fever [ n ]; or chills [  ];          Dental: poor dentition[  ]; Last Dentist visit:   Eye : blurred vision [  ]; diplopia [   ]; vision changes [  ];  Amaurosis fugax[  ]; Resp: cough [ y ];  wheezing[ n ];  hemoptysis[n  ]; shortness of breath[  ]; paroxysmal nocturnal dyspnea[y  ]; dyspnea on exertion[ y ]; or orthopnea[  ];  GI:  gallstones[  ], vomiting[  ];  dysphagia[  ]; melena[  ];  hematochezia [  ]; heartburn[  ];   Hx of  Colonoscopy[  ]; GU: kidney stones [  ]; hematuria[  ];   dysuria [  ];  nocturia[  ];  history of     obstruction [  ]; urinary frequency [  ]             Skin: rash, swelling[  ];, hair loss[  ];  peripheral edema[  ];  or itching[  ]; Musculosketetal: myalgias[  ];  joint swelling[  ];  joint erythema[  ];  joint pain[  ];  back pain[y surgery  ];  Heme/Lymph: bruising[  ];  bleeding[  ];  anemia[  ];  Neuro: TIA[  ];  headaches[  ];  stroke[  ];  vertigo[  ];  seizures[  ];   paresthesias[  ];  difficulty walking[  n];  Psych:depression[  ]; anxiety[  ];  Endocrine: diabetes[ n ];  thyroid dysfunction[ n ];  Immunizations: Flu up to date Florencio.Farrier ]; Pneumococcal up to date [ n ];  Other:  Physical Exam: BP (!) 152/81   Pulse 96   Temp 97.8 F (36.6 C) (Oral)   Resp 20   Wt 148 lb (67.1 kg)  SpO2 96%   BMI 27.07 kg/m   PHYSICAL EXAMINATION: General appearance: alert, cooperative and no distress Head: Normocephalic, without obvious abnormality, atraumatic Neck: no adenopathy, no carotid bruit, no JVD, supple, symmetrical, trachea midline and thyroid not enlarged, symmetric, no tenderness/mass/nodules Lymph nodes: Cervical, supraclavicular, and axillary nodes normal. Resp: clear to auscultation bilaterally Back: symmetric, no curvature. ROM normal. No CVA tenderness. Cardio: regular rate and rhythm, S1, S2 normal, no murmur, click, rub or gallop GI: soft, non-tender; bowel sounds normal; no masses,  no organomegaly Extremities:  extremities normal, atraumatic, no cyanosis or edema and Homans sign is negative, no sign of DVT Neurologic: Grossly normal Patient has no palpable mass in her neck that would be suggestive of a site to biopsy She has full DP and PT pulses bilaterally  Diagnostic Studies & Laboratory data:     Recent Radiology Findings:   Dg Chest 2 View  Result Date: 03/15/2016 CLINICAL DATA:  Preoperative evaluation for lung biopsy. EXAM: CHEST  2 VIEW COMPARISON:  03/10/2016 FINDINGS: Large anterior and middle mediastinal mass remains evident, similar to the recent previous exams. Right upper lobe parenchymal mass seen at CT best discernible on the lateral view. Heart size is normal. There is aortic atherosclerosis. The pulmonary vascularity is normal. Peripheral lung parenchyma is clear. No effusions. No acute bone finding. IMPRESSION: Re- demonstration of large anterior and middle mediastinal mass. No change radiographically. Previously demonstrated right upper lobe parenchymal lesion visible on the lateral view. Electronically Signed   By: Nelson Chimes M.D.   On: 03/15/2016 13:57   Dg Chest 2 View  Addendum Date: 03/10/2016   ADDENDUM REPORT: 03/10/2016 17:10 ADDENDUM: The original report was by Dr. Van Clines. The following addendum is by Dr. Van Clines: These results were called by telephone at the time of interpretation on 03/10/2016 at 5:02 pm to Dr. Tenna Delaine , who verbally acknowledged these results. Electronically Signed   By: Van Clines M.D.   On: 03/10/2016 17:10   Result Date: 03/10/2016 CLINICAL DATA:  Nonproductive cough.  Difficulty swallowing. EXAM: CHEST  2 VIEW COMPARISON:  02/20/2012 FINDINGS: Abnormal right lower paratracheal stripe, thickened the within abnormal right lateral convex margin. Abnormal increased distance between the trachea and the visible aortic arch contour. Atherosclerotic calcification of the aortic arch. Heart size normal. No bony abnormality.  Airway thickening is present, suggesting bronchitis or reactive airways disease. IMPRESSION: 1. Masslike density along the anterior aortic arch. Tumor/malignancy, adenopathy, and aortic arch aneurysm are possibilities. I recommend urgent CT angiography of the chest using aortic dissection protocol to assess for tumor and/or saccular aneurysm. 2. Airway thickening is present, suggesting bronchitis or reactive airways disease. Radiology assistant personnel have been notified to put me in telephone contact with the referring physician or the referring physician's clinical representative in order to discuss these findings. Once this communication is established I will issue an addendum to this report for documentation purposes. Electronically Signed: By: Van Clines M.D. On: 03/10/2016 16:52   Ct Angio Chest W/cm &/or Wo Cm  Result Date: 03/10/2016 CLINICAL DATA:  persistent cough, sob, abnormal CXR: "Masslike density along the anterior aortic arch.Tumor/malignancy, adenopathy, and aortic arch aneurysm arepossibilities. I recommend urgent CT angiography of the chest usingaortic dissection protocol to assess for tumor and/or saccularAneurysm." EXAM: CT ANGIOGRAPHY CHEST WITH CONTRAST TECHNIQUE: Multidetector CT imaging of the chest was performed using the standard protocol during bolus administration of intravenous contrast. Multiplanar CT image reconstructions and MIPs were obtained to evaluate the vascular anatomy.  CONTRAST:  100 mL of Isovue 370 intravenous contrast COMPARISON:  Current chest radiograph. FINDINGS: Cardiovascular: No aortic aneurysm or dissection. There is mild partly calcified plaque along the aortic arch and at the origin of the innominate and left subclavian arteries. Heart is normal in size. Mild coronary artery calcifications. Minimal pericardial effusion. There is narrowing of the pulmonary arteries from a large mediastinal mass described below. Mediastinum/Nodes: There is a large mass  in the mediastinum. Mass is centered at the level of the AP window just above the [place]. It measures approximately 7.5 x 7.4 x 9.3 cm. The mass surrounds the pulmonary vasculature and contacts a significant portion of the ascending aorta and aortic arch, surrounding the aortic arch branch vessels. The mass contacts and narrows the right left pulmonary arteries, greater involving the left. Mass extends into the right hilum and is contiguous with a right upper lobe nodule. The right upper lobe nodule measures 2.9 x 1.7 x 2.7 cm. Lungs/Pleura: Right upper lobe nodule described above measuring 2.9 cm in greatest dimension. No other lung nodules. No evidence of pneumonia or edema. There is mild pleural parenchymal scarring at the apices. No pleural effusion or pneumothorax. Upper Abdomen: Low-density right adrenal mass measuring 3.7 x 2.8 x 3.4 cm. Mild thickening of the left adrenal gland. No liver mass or lesion on the included field of view. Musculoskeletal: No osteoblastic or osteolytic lesions. Review of the MIP images confirms the above findings. IMPRESSION: 1. The enlargement noted on the current chest radiograph is due to a large mass, which extends throughout much of the mediastinum encasing the pulmonary arteries and portions of the thoracic aorta and aortic arch branch vessels. The mass encases and narrows the main pulmonary arteries, left greater than right. Mass is contiguous with a large nodule, borderline mass, in the right upper lobe, measuring 2.9 cm in greatest dimension. Findings are highly suspicious for lung carcinoma with metastatic mediastinal confluent adenopathy. 2. No other lung nodules. No evidence of pneumonia or pulmonary edema. Electronically Signed   By: Lajean Manes M.D.   On: 03/10/2016 20:45    There is a right adrenal mass noted on CT this appears to been present since at least 2013 on a previous CT of the chest.  I have independently reviewed the above radiology studies  and  reviewed the findings with the patient.   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 I recommend to the patient that we proceed in a timely fashion with bronchoscopy with ebus and biopsy of mediastinal nodes possible mediastinoscopy. The patient and her son have had their questions answered and she is willing to proceed we'll tentatively plan for this Monday afternoon October 2. Further evaluation with MRI of the brain and PET  scan will proceed but obtain a tissue diagnosis in a timely fashion to start therapy before she develops significant compressive symptoms of the esophagus trachea and superior vena cava.  The goals risks and alternatives of the planned surgical procedure bronchoscopy with ebus and biopsy of mediastinal nodes possible mediastinoscopy have been discussed with the patient in detail. The risks of the procedure including death, infection, stroke, myocardial infarction, bleeding, blood transfusion have all been discussed specifically.  I have quoted [employer] a 3 % of perioperative mortality and a complication rate as high as 25 %. The patient's questions have been answered.[name] is willing  to proceed with the planned procedure.   Grace Isaac MD      [address] [place] [address] Office [phone]   Beeper  [phone]  03/15/2016 3:14 PM

## 2016-03-16 ENCOUNTER — Encounter (HOSPITAL_COMMUNITY): Payer: Self-pay | Admitting: Cardiothoracic Surgery

## 2016-03-17 ENCOUNTER — Telehealth: Payer: Self-pay | Admitting: *Deleted

## 2016-03-17 ENCOUNTER — Encounter: Payer: Self-pay | Admitting: Internal Medicine

## 2016-03-17 ENCOUNTER — Ambulatory Visit (HOSPITAL_BASED_OUTPATIENT_CLINIC_OR_DEPARTMENT_OTHER): Payer: No Typology Code available for payment source | Admitting: Internal Medicine

## 2016-03-17 ENCOUNTER — Telehealth: Payer: Self-pay | Admitting: Internal Medicine

## 2016-03-17 ENCOUNTER — Encounter: Payer: Self-pay | Admitting: *Deleted

## 2016-03-17 DIAGNOSIS — C3491 Malignant neoplasm of unspecified part of right bronchus or lung: Secondary | ICD-10-CM | POA: Diagnosis not present

## 2016-03-17 DIAGNOSIS — G4700 Insomnia, unspecified: Secondary | ICD-10-CM

## 2016-03-17 MED ORDER — PROCHLORPERAZINE MALEATE 10 MG PO TABS: 10.0000 mg | ORAL_TABLET | Freq: Four times a day (QID) | ORAL | 0 refills | Status: DC | PRN

## 2016-03-17 MED ORDER — TEMAZEPAM 30 MG PO CAPS: 30.0000 mg | ORAL_CAPSULE | Freq: Every evening | ORAL | 0 refills | Status: DC | PRN

## 2016-03-17 NOTE — Op Note (Signed)
[name], [name]                 ACCOUNT NO.:  [id number]  MEDICAL RECORD NO.:  [id number]  LOCATION:  [facility]                         FACILITY:  [facility]  PHYSICIAN:  Lanelle Bal, MD    DATE OF BIRTH:  05/18/1951  DATE OF PROCEDURE:  03/15/2016 DATE OF DISCHARGE:  03/15/2016                              OPERATIVE REPORT   PREOPERATIVE DIAGNOSES:  Mediastinal adenopathy and right lung mass suggestive of carcinoma of the lung.  POSTOPERATIVE DIAGNOSES:  Mediastinal adenopathy and right lung mass suggestive of carcinoma of the lung.  SURGICAL PROCEDURES:  Bronchoscopy under anesthesia with endobronchial ultrasound and transbronchial biopsies of mediastinal mass.  SURGEON:  Lanelle Bal, MD  BRIEF HISTORY:  The patient is a 65 year old [place] woman who has a long history of smoking, presented with increasing cough.  Chest x-ray and CT scan showed extensive mediastinal adenopathy suggestive of small cell carcinoma.  It was recommended to the patient to proceed with obtaining a tissue diagnosis.  Risks and options were discussed with her and her son.  She is agreeable and signed informed consent for bronchoscopy, EBUS with biopsy, possible mediastinoscopy.  DESCRIPTION OF PROCEDURE:  The patient underwent general endotracheal anesthesia without incident.  Appropriate time-out was performed. Fiberoptic bronchoscope was passed to the subsegmental level in both the right and left tracheobronchial tree.  No definite endobronchial lesions were noted.  However, there was some extrinsic compression of the mainstem bronchus especially anteriorly into the right, but without complete collapse.  The scope was removed and EBUS scope was placed. Extensive mediastinal adenopathy was easily identified in the segment area and multiple passes were obtained with the EBUS needle and aspirating.  Three passes were created as quick stains, another 5-6 passes were all put in satellite for additional  studies as needed. 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  EBUS scope was removed and standard bronchoscope was placed back and airways cleared of any secretions.  The patient was extubated in the operating room and transferred to the recovery room for further postoperative observation.     Lanelle Bal, MD     EG/MEDQ  D:  03/16/2016  T:  03/17/2016  Job:  [id number]

## 2016-03-17 NOTE — Progress Notes (Signed)
START ON PATHWAY REGIMEN - Small Cell Lung  [id number]: Etoposide 100 mg/m2 Days 1, 2, 3 + Carboplatin AUC=5 Day 1 q21 Days x 4 Cycles   A cycle is every 21 days:     Etoposide (Toposar(R)) 100 mg/m2 in 500 mL NS IV over 2 hours days 1-3 Dose Mod: None     Carboplatin (Paraplatin(R)) AUC=5 in 500 mL NS IV over 1 hour day 1 only Dose Mod: None Additional Orders: * All AUC calculations intended to be used in [employer] formula  **Always confirm dose/schedule in your pharmacy ordering system**    Patient Characteristics: Extensive Stage, First Line Stage Grouping: Extensive AJCC M Stage: 1 AJCC N Stage: 3 AJCC T Stage: 1 Line of therapy: First Line Would you be surprised if this patient died  in the next year? I would NOT be surprised if this patient died in the next year  Intent of Therapy: Non-Curative / Palliative Intent, Discussed with Patient

## 2016-03-17 NOTE — Progress Notes (Signed)
[name]) [phone]   Fax:([phone]) [phone]  OFFICE PROGRESS NOTE  Madeline Douglas, PA-C [address] [place] [address]  DIAGNOSIS: Extensive stage (T1b, N3, M1b) small cell lung cancer presented with right upper lobe lung mass in addition to massive mediastinal lymphadenopathy and suspicious right adrenal gland metastasis diagnosed in October 2017  PRIOR THERAPY: None  CURRENT THERAPY: Systemic chemotherapy with carboplatin for AUC of 5 on day 1 and etoposide 100 MG/M2 on days 1, 2 and 3 with Neulasta support. First dose 03/23/2016.  INTERVAL HISTORY: [name] 65 y.o. female returns to the clinic today for follow-up visit accompanied by her son. The patient continues to complain of the hoarseness of her voice in addition to chest pain or shortness of breath. She also has cough and insomnia. She was referred to Dr. Servando Snare and underwent bronchoscopy with endobronchial ultrasound and biopsy of level 7 lymph node on 03/15/2016. The final cytology (Accession: [id number]) showed malignant cells consistent with a small cell carcinoma. The patient is here today for evaluation and discussion of her treatment options based on the recent tissue diagnosis. Her PET scan and MRI of the brain are scheduled for 03/26/2016. She has no nausea, vomiting, diarrhea or constipation. She denied having any weight loss or night sweats but she continues to have headache.  MEDICAL HISTORY: Past Medical History:  Diagnosis Date  . Anemia    in the past  . Arthritis   . Depression   . Headache   . Hyperlipidemia   . Hypertension   . Lung mass 03/12/2016  . PONV (postoperative nausea and vomiting)    after hysterectomy  . Shortness of breath dyspnea    with exertion    ALLERGIES:  is allergic to penicillins.  MEDICATIONS:  Current Outpatient Prescriptions  Medication Sig Dispense Refill  . atorvastatin (LIPITOR) 40 MG tablet Take 40 mg by mouth at bedtime.   0    . azithromycin (ZITHROMAX) 250 MG tablet Take 2 tabs PO x 1 dose, then 1 tab PO QD x 4 days (Patient taking differently: Take 250-500 mg by mouth See admin instructions. Take 500 mg by mouth for 1 dose, then take 250 mg by mouth daily for 4 days) 6 tablet 0  . benzonatate (TESSALON) 100 MG capsule Take 1-2 capsules (100-200 mg total) by mouth 3 (three) times daily as needed for cough. 40 capsule 0  . hydrochlorothiazide (HYDRODIURIL) 25 MG tablet TAKE 1 TABLET BY MOUTH DAILY (Patient taking differently: Take 25 mg by mouth daily) 90 tablet 1   No current facility-administered medications for this visit.     SURGICAL HISTORY:  Past Surgical History:  Procedure Laterality Date  . ABDOMINAL HYSTERECTOMY     2009 , due to fobroids, benign path for uterus, tumor and also cervix.   [facility] BACK SURGERY     lower back  . COLONOSCOPY    . VIDEO BRONCHOSCOPY WITH ENDOBRONCHIAL ULTRASOUND N/A 03/15/2016   Procedure: VIDEO BRONCHOSCOPY WITH ENDOBRONCHIAL ULTRASOUND AND BIOPSY OF LEVEL SEVEN LYMPH NODE;  Surgeon: Grace Isaac, MD;  Location: [facility];  Service: Thoracic;  Laterality: N/A;    REVIEW OF SYSTEMS:  Constitutional: positive for fatigue Eyes: negative Ears, nose, mouth, throat, and face: positive for hoarseness Respiratory: positive for cough, dyspnea on exertion and pleurisy/chest pain Cardiovascular: negative Gastrointestinal: negative Genitourinary:negative Integument/breast: negative Hematologic/lymphatic: negative Musculoskeletal:negative Neurological: positive for headaches Behavioral/Psych: negative Endocrine: negative Allergic/Immunologic: negative   PHYSICAL EXAMINATION: General appearance:  alert, cooperative, fatigued and no distress Head: Normocephalic, without obvious abnormality, atraumatic Neck: no adenopathy, no JVD, supple, symmetrical, trachea midline and thyroid not enlarged, symmetric, no tenderness/mass/nodules Lymph nodes: Cervical, supraclavicular, and axillary  nodes normal. Resp: wheezes bilaterally Back: symmetric, no curvature. ROM normal. No CVA tenderness. Cardio: regular rate and rhythm, S1, S2 normal, no murmur, click, rub or gallop GI: soft, non-tender; bowel sounds normal; no masses,  no organomegaly Extremities: extremities normal, atraumatic, no cyanosis or edema Neurologic: Alert and oriented X 3, normal strength and tone. Normal symmetric reflexes. Normal coordination and gait  ECOG PERFORMANCE STATUS: 1 - Symptomatic but completely ambulatory  Blood pressure 134/71, pulse (!) 109, temperature 98.5 F (36.9 C), temperature source Oral, resp. rate 18, height '5\' 2"'$  (1.575 m), weight 148 lb 9.6 oz (67.4 kg), SpO2 96 %.  LABORATORY DATA: Lab Results  Component Value Date   WBC 8.3 03/12/2016   HGB 13.6 03/12/2016   HCT 40.1 03/12/2016   MCV 87.0 03/12/2016   PLT 258 03/12/2016      Chemistry      Component Value Date/Time   NA 135 03/15/2016 1325   NA 139 03/12/2016 0920   K 3.1 (L) 03/15/2016 1325   K 3.2 (L) 03/12/2016 0920   CL 98 (L) 03/15/2016 1325   CO2 27 03/15/2016 1325   CO2 26 03/12/2016 0920   BUN 13 03/15/2016 1325   BUN 13.0 03/12/2016 0920   CREATININE 0.59 03/15/2016 1325   CREATININE 0.7 03/12/2016 0920      Component Value Date/Time   CALCIUM 9.7 03/15/2016 1325   CALCIUM 10.0 03/12/2016 0920   ALKPHOS 48 03/15/2016 1325   ALKPHOS 55 03/12/2016 0920   AST 22 03/15/2016 1325   AST 18 03/12/2016 0920   ALT 19 03/15/2016 1325   ALT 21 03/12/2016 0920   BILITOT 0.5 03/15/2016 1325   BILITOT 0.36 03/12/2016 0920       RADIOGRAPHIC STUDIES: Dg Chest 2 View  Result Date: 03/15/2016 CLINICAL DATA:  Preoperative evaluation for lung biopsy. EXAM: CHEST  2 VIEW COMPARISON:  03/10/2016 FINDINGS: Large anterior and middle mediastinal mass remains evident, similar to the recent previous exams. Right upper lobe parenchymal mass seen at CT best discernible on the lateral view. Heart size is normal. There is  aortic atherosclerosis. The pulmonary vascularity is normal. Peripheral lung parenchyma is clear. No effusions. No acute bone finding. IMPRESSION: Re- demonstration of large anterior and middle mediastinal mass. No change radiographically. Previously demonstrated right upper lobe parenchymal lesion visible on the lateral view. Electronically Signed   By: Nelson Chimes M.D.   On: 03/15/2016 13:57   Dg Chest 2 View  Addendum Date: 03/10/2016   ADDENDUM REPORT: 03/10/2016 17:10 ADDENDUM: The original report was by Dr. Van Clines. The following addendum is by Dr. Van Clines: These results were called by Lewis at the time of interpretation on 03/10/2016 at 5:02 pm to Dr. Tenna Delaine , who verbally acknowledged these results. Electronically Signed   By: Van Clines M.D.   On: 03/10/2016 17:10   Result Date: 03/10/2016 CLINICAL DATA:  Nonproductive cough.  Difficulty swallowing. EXAM: CHEST  2 VIEW COMPARISON:  02/20/2012 FINDINGS: Abnormal right lower paratracheal stripe, thickened the within abnormal right lateral convex margin. Abnormal increased distance between the trachea and the visible aortic arch contour. Atherosclerotic calcification of the aortic arch. Heart size normal. No bony abnormality. Airway thickening is present, suggesting bronchitis or reactive airways disease. IMPRESSION: 1. Masslike density along the  anterior aortic arch. Tumor/malignancy, adenopathy, and aortic arch aneurysm are possibilities. I recommend urgent CT angiography of the chest using aortic dissection protocol to assess for tumor and/or saccular aneurysm. 2. Airway thickening is present, suggesting bronchitis or reactive airways disease. Radiology assistant personnel have been notified to put me in Lewis contact with the referring physician or the referring physician's clinical representative in order to discuss these findings. Once this communication is established I will issue an addendum to this  report for documentation purposes. Electronically Signed: By: Van Clines M.D. On: 03/10/2016 16:52   Ct Angio Chest W/cm &/or Wo Cm  Result Date: 03/10/2016 CLINICAL DATA:  persistent cough, sob, abnormal CXR: "Masslike density along the anterior aortic arch.Tumor/malignancy, adenopathy, and aortic arch aneurysm arepossibilities. I recommend urgent CT angiography of the chest usingaortic dissection protocol to assess for tumor and/or saccularAneurysm." EXAM: CT ANGIOGRAPHY CHEST WITH CONTRAST TECHNIQUE: Multidetector CT imaging of the chest was performed using the standard protocol during bolus administration of intravenous contrast. Multiplanar CT image reconstructions and MIPs were obtained to evaluate the vascular anatomy. CONTRAST:  100 mL of Isovue 370 intravenous contrast COMPARISON:  Current chest radiograph. FINDINGS: Cardiovascular: No aortic aneurysm or dissection. There is mild partly calcified plaque along the aortic arch and at the origin of the innominate and left subclavian arteries. Heart is normal in size. Mild coronary artery calcifications. Minimal pericardial effusion. There is narrowing of the pulmonary arteries from a large mediastinal mass described below. Mediastinum/Nodes: There is a large mass in the mediastinum. Mass is centered at the level of the AP window just above the [place]. It measures approximately 7.5 x 7.4 x 9.3 cm. The mass surrounds the pulmonary vasculature and contacts a significant portion of the ascending aorta and aortic arch, surrounding the aortic arch branch vessels. The mass contacts and narrows the right left pulmonary arteries, greater involving the left. Mass extends into the right hilum and is contiguous with a right upper lobe nodule. The right upper lobe nodule measures 2.9 x 1.7 x 2.7 cm. Lungs/Pleura: Right upper lobe nodule described above measuring 2.9 cm in greatest dimension. No other lung nodules. No evidence of pneumonia or edema. There is  mild pleural parenchymal scarring at the apices. No pleural effusion or pneumothorax. Upper Abdomen: Low-density right adrenal mass measuring 3.7 x 2.8 x 3.4 cm. Mild thickening of the left adrenal gland. No liver mass or lesion on the included field of view. Musculoskeletal: No osteoblastic or osteolytic lesions. Review of the MIP images confirms the above findings. IMPRESSION: 1. The enlargement noted on the current chest radiograph is due to a large mass, which extends throughout much of the mediastinum encasing the pulmonary arteries and portions of the thoracic aorta and aortic arch branch vessels. The mass encases and narrows the main pulmonary arteries, left greater than right. Mass is contiguous with a large nodule, borderline mass, in the right upper lobe, measuring 2.9 cm in greatest dimension. Findings are highly suspicious for lung carcinoma with metastatic mediastinal confluent adenopathy. 2. No other lung nodules. No evidence of pneumonia or pulmonary edema. Electronically Signed   By: Lajean Manes M.D.   On: 03/10/2016 20:45    ASSESSMENT AND PLAN: This is a very pleasant 65 years old white female recently diagnosed with extensive stage small cell lung cancer presented with large central lung mass involving the mediastinum and encasing the pulmonary arteries and portion of the thoracic aorta and aortic arch branches with narrowing of the main pulmonary arteries. There  was also right upper lobe lung mass as well as questionable metastatic right adrenal gland lesion. Further staging workup and the scans are still pending. The patient is currently symptomatic and I had a lengthy discussion with her and her son regarding her condition and treatment options. I discussed with the patient her treatment options including palliative care versus palliative systemic chemotherapy with carboplatin for AUC of 5 on day 1 and etoposide 100 MG/M2 on days 1, 2 and 3 with Neulasta support on day 4. The patient  and her son are interested in treatment and she is expected to start the first cycle of this treatment on 03/23/2016. I discussed with them the adverse effect of this treatment including but not limited to alopecia, myelosuppression, nausea or vomiting, peripheral neuropathy, liver or renal dysfunction. She is also expected to see Dr. Tammi Klippel next week for consideration of palliative radiotherapy. I will see the patient back for follow-up visit in 2 weeks for evaluation and management of any adverse effect of her treatment. She would have a chemotherapy education class before starting the first dose of her treatment. For insomnia, I will start the patient on Restoril 30 mg by mouth daily at bedtime. I also called her pharmacy with prescription for Compazine 10 mg by mouth every 6 hours as needed for nausea. She was advised to call immediately if she has any concerning symptoms in the interval. The patient voices understanding of current disease status and treatment options and is in agreement with the current care plan.  All questions were answered. The patient knows to call the clinic with any problems, questions or concerns. We can certainly see the patient much sooner if necessary.  Disclaimer: This note was dictated with voice recognition software. Similar sounding words can inadvertently be transcribed and may not be corrected upon review.

## 2016-03-17 NOTE — Telephone Encounter (Signed)
Per LOS I have scheduled appts and notified the scheduler 

## 2016-03-17 NOTE — Telephone Encounter (Signed)
Message sent to chemo scheduler to add chemo per 03/17/16 los. Avs report and  Appointment schedule, given to patient, per 03/17/16 los.

## 2016-03-17 NOTE — Progress Notes (Signed)
Thoracic Location of Tumor / Histology: Suspicious Lung Cancer  Mrs. [name] presented  months ago with symptoms of: A two month history of cold symptoms coming and going and a persistent cough, recently noted increasing hoarseness and some trouble swallowing started two weeks ago.  Biopsies of  (if applicable) [id number] Diagnosis FINE NEEDLE ASPIRATION, ENDOSCOPIC EBUS (A), LEVEL 7 NODE (SPECIMEN 1 OF 1 COLLECTED 03-15-2016) MALIGNANT CELLS CONSISTENT WITH SMALL CELL CARCINOMA. Preliminary Diagnosis DX: ADEQUATE. MALIGNANT. (JDP) JULIA  Tobacco/Marijuana/Snuff/ETOH use: Quit 03-10-16 1 p/dx 37 years no drug or alcohol  Past/Anticipated interventions by cardiothoracic surgery, if any: 03-15-16 FINE NEEDLE ASPIRATION  Past/Anticipated interventions by medical oncology, if [id number] investigation to confirm diagnosis as well as treatment options, PET scan and MRI of brain  Signs/Symptoms Weight changes, if any:  Wt Readings from Last 3 Encounters:  03/18/16 147 lb 12.8 oz (67 kg)  03/17/16 148 lb 9.6 oz (67.4 kg)  03/15/16 148 lb (67.1 kg)     Respiratory complaints, if any: Denies SOB with exertion,coughing with clear secretion  Hemoptysis, if any: No  Pain issues, if any: No   SAFETY ISSUES:  Prior radiation? No  Pacemaker/ICD?  No  Possible current pregnancy? No  Is the patient on methotrexate? No  Current Complaints / other details:  Has been having frontal headaches on the left side  Father died of throat cancer BP 118/86 (BP Location: Left Arm, Patient Position: Sitting, Cuff Size: Normal)   Pulse (!) 106   Temp 99.4 F (37.4 C) (Oral)   Resp 18   Ht '5\' 2"'$  (1.575 m)   Wt 147 lb 12.8 oz (67 kg)   SpO2 95%   BMI 27.03 kg/m

## 2016-03-17 NOTE — Progress Notes (Signed)
Spoke with son Geralynn Ochs who take calls for his mother explained that his mother needs to arrive for her appointment tomorrow at 07:30 to meet with the nurse.  Dr. Tammi Klippel will see his mother around 45 to 0830 because he had a prior appointment already scheduled at [facility] in the morning.  Mr. Fortenberry said he or his sister would be here with their mother at 84 in the morning.

## 2016-03-18 ENCOUNTER — Ambulatory Visit
Admission: RE | Admit: 2016-03-18 | Discharge: 2016-03-18 | Disposition: A | Discharge status: Home / Self Care | Payer: No Typology Code available for payment source | Source: Ambulatory Visit | Attending: Radiation Oncology | Admitting: Radiation Oncology

## 2016-03-18 ENCOUNTER — Encounter: Payer: Self-pay | Admitting: *Deleted

## 2016-03-18 ENCOUNTER — Other Ambulatory Visit: Payer: No Typology Code available for payment source

## 2016-03-18 ENCOUNTER — Encounter: Payer: Self-pay | Admitting: Radiation Oncology

## 2016-03-18 VITALS — BP 118/86 | HR 106 | Temp 99.4°F | Resp 18 | Ht 62.0 in | Wt 147.8 lb

## 2016-03-18 DIAGNOSIS — Z880 Allergy status to penicillin: Secondary | ICD-10-CM | POA: Insufficient documentation

## 2016-03-18 DIAGNOSIS — C3491 Malignant neoplasm of unspecified part of right bronchus or lung: Secondary | ICD-10-CM

## 2016-03-18 DIAGNOSIS — I871 Compression of vein: Secondary | ICD-10-CM | POA: Diagnosis not present

## 2016-03-18 DIAGNOSIS — Z87891 Personal history of nicotine dependence: Secondary | ICD-10-CM | POA: Diagnosis not present

## 2016-03-18 DIAGNOSIS — E785 Hyperlipidemia, unspecified: Secondary | ICD-10-CM | POA: Insufficient documentation

## 2016-03-18 DIAGNOSIS — Z90710 Acquired absence of both cervix and uterus: Secondary | ICD-10-CM | POA: Diagnosis not present

## 2016-03-18 DIAGNOSIS — M1990 Unspecified osteoarthritis, unspecified site: Secondary | ICD-10-CM | POA: Diagnosis not present

## 2016-03-18 DIAGNOSIS — R1310 Dysphagia, unspecified: Secondary | ICD-10-CM | POA: Diagnosis not present

## 2016-03-18 DIAGNOSIS — I10 Essential (primary) hypertension: Secondary | ICD-10-CM | POA: Insufficient documentation

## 2016-03-18 DIAGNOSIS — Z800 Family history of malignant neoplasm of digestive organs: Secondary | ICD-10-CM | POA: Insufficient documentation

## 2016-03-18 DIAGNOSIS — E278 Other specified disorders of adrenal gland: Secondary | ICD-10-CM | POA: Insufficient documentation

## 2016-03-18 DIAGNOSIS — E876 Hypokalemia: Secondary | ICD-10-CM | POA: Insufficient documentation

## 2016-03-18 NOTE — Progress Notes (Signed)
Radiation Oncology         [phone] ________________________________  Initial outpatient Consultation  Name: [name] MRN: [id number]  Date: 03/18/2016  DOB: [birth year]  MQ:[id number] D Timmothy Euler, PA-C  Curt Bears, MD   REFERRING PHYSICIAN: Curt Bears, MD  DIAGNOSIS: The encounter diagnosis was Small cell carcinoma of lung, right ([place]).    ICD-9-CM ICD-10-CM   1. Small cell carcinoma of lung, right (HCC) 162.9 C34.91 Ambulatory referral to Social Work    HISTORY OF PRESENT ILLNESS: [name] is a 65 y.o. female seen at the request of Dr. 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 The patient originally presented with cough who presented for care and was found to have extensive mediastinal adenopathy, and who undewent bronchoscopy with EBUS on 03/15/16, and biopsy revealed small cell carcinoma. She has met with Dr. Julien Nordmann and will be undergoing PET scan and MRI of the brain next week. She will be starting chemotherapy with carboplatin/etoposide next week as well, and is seen today due to concerns of impending SVC syndrome.   PREVIOUS RADIATION THERAPY: No  PAST MEDICAL HISTORY:  Past Medical History:  Diagnosis Date  . Anemia    in the past  . Arthritis   . Depression   . Headache   . Hyperlipidemia   . Hypertension   . Lung mass 03/12/2016  . PONV (postoperative nausea and vomiting)    after hysterectomy  . Shortness of breath dyspnea    with exertion      PAST SURGICAL HISTORY: Past Surgical History:  Procedure Laterality Date  . ABDOMINAL HYSTERECTOMY     2009 , due to fobroids, benign path for uterus, tumor and also cervix.   [facility] BACK SURGERY     lower back  . COLONOSCOPY    . VIDEO BRONCHOSCOPY WITH ENDOBRONCHIAL ULTRASOUND N/A 03/15/2016   Procedure: VIDEO BRONCHOSCOPY WITH ENDOBRONCHIAL ULTRASOUND AND BIOPSY OF LEVEL SEVEN LYMPH NODE;  Surgeon: Grace Isaac, MD;   Location: [facility];  Service: Thoracic;  Laterality: N/A;    FAMILY HISTORY:  Family History  Problem Relation Age of Onset  . Cancer Father 84    Throat Cancer    SOCIAL HISTORY:  Social History   Social History  . Marital status: Married    Spouse name: N/A  . Number of children: N/A  . Years of education: N/A   Occupational History  . Not on file.   Social History Main Topics  . Smoking status: Former Smoker    Packs/day: 1.00    Years: 37.00    Types: Cigarettes    Quit date: 03/10/2016  . Smokeless tobacco: Never Used  . Alcohol use No  . Drug use: No  . Sexual activity: Not on file   Other Topics Concern  . Not on file   Social History Narrative  . No narrative on file    ALLERGIES: Penicillins  MEDICATIONS:  Current Outpatient Prescriptions  Medication Sig Dispense Refill  . atorvastatin (LIPITOR) 40 MG tablet Take 40 mg by mouth at bedtime.   0  . benzonatate (TESSALON) 100 MG capsule Take 1-2 capsules (100-200 mg total) by mouth 3 (three) times daily as needed for cough. 40 capsule 0  . hydrochlorothiazide (HYDRODIURIL) 25 MG tablet TAKE 1 TABLET BY MOUTH DAILY (Patient taking differently: Take 25 mg by mouth daily) 90 tablet 1  . temazepam (RESTORIL) 30 MG capsule Take 1 capsule (30 mg  total) by mouth at bedtime as needed for sleep. 30 capsule 0  . prochlorperazine (COMPAZINE) 10 MG tablet Take 1 tablet (10 mg total) by mouth every 6 (six) hours as needed for nausea or vomiting. (Patient not taking: Reported on 03/18/2016) 30 tablet 0   No current facility-administered medications for this encounter.     REVIEW OF SYSTEMS:  On review of systems, the patient reports that she is doing well overall. The patient's son helped facilitate our visit by translating for her. She denies any chest pain, cough, fevers, chills, night sweats, unintended weight changes. She denies any bowel or bladder disturbances, and denies abdominal pain, nausea or vomiting. She denies  any new musculoskeletal or joint aches or pains. She denies sore throat. She sometimes feels like her heart is racing. She reports shortness of breath with exertion. She reports hoarse voice which has improved slightly. She reports having frontal headaches on the left side. A complete review of systems is obtained and is otherwise negative.   PHYSICAL EXAM:  height is '5\' 2"'  (1.575 m) and weight is 147 lb 12.8 oz (67 kg). Her oral temperature is 99.4 F (37.4 C). Her blood pressure is 118/86 and her pulse is 106 (abnormal). Her respiration is 18 and oxygen saturation is 95%.   Pain Scale 0/10 In general this is a well appearing Caucasian female in no acute distress. She is alert and oriented x4 and appropriate throughout the examination. She is tachycardic. HEENT reveals that the patient is normocephalic, atraumatic. EOMs are intact. PERRLA. Skin is intact without any evidence of gross lesions. Cardiovascular exam reveals a tachycardic rate and normal rhythm, no clicks rubs or murmurs are auscultated. Chest is clear to auscultation bilaterally. Lymphatic assessment is performed and does not reveal any adenopathy in the cervical, supraclavicular, axillary, or inguinal chains. No distended veins looking at the chest. No edema of the upper extremities is noted bilaterally or of the neck, or face. Abdomen has active bowel sounds in all quadrants and is intact. The abdomen is soft, non tender, non distended. Lower extremities are negative for pretibial pitting edema, deep calf tenderness, cyanosis or clubbing.    KPS = 70  100 - Normal; no complaints; no evidence of disease. 90   - Able to carry on normal activity; minor signs or symptoms of disease. 80   - Normal activity with effort; some signs or symptoms of disease. 46   - Cares for self; unable to carry on normal activity or to do active work. 60   - Requires occasional assistance, but is able to care for most of his personal needs. 50   - Requires  considerable assistance and frequent medical care. 8   - Disabled; requires special care and assistance. 7   - Severely disabled; hospital admission is indicated although death not imminent. 24   - Very sick; hospital admission necessary; active supportive treatment necessary. 10   - Moribund; fatal processes progressing rapidly. 0     - Dead  Karnofsky DA, Abelmann Cotter, Craver LS and Burchenal [facility] [phone]) The use of the nitrogen mustards in the palliative treatment of carcinoma: with particular reference to bronchogenic carcinoma Cancer 1 634-56  LABORATORY DATA:  Lab Results  Component Value Date   WBC 8.3 03/12/2016   HGB 13.6 03/12/2016   HCT 40.1 03/12/2016   MCV 87.0 03/12/2016   PLT 258 03/12/2016   Lab Results  Component Value Date   NA 135 03/15/2016   K 3.1 (L)  03/15/2016   CL 98 (L) 03/15/2016   CO2 27 03/15/2016   Lab Results  Component Value Date   ALT 19 03/15/2016   AST 22 03/15/2016   ALKPHOS 48 03/15/2016   BILITOT 0.5 03/15/2016     RADIOGRAPHY: Dg Chest 2 View  Result Date: 03/15/2016 CLINICAL DATA:  Preoperative evaluation for lung biopsy. EXAM: CHEST  2 VIEW COMPARISON:  03/10/2016 FINDINGS: Large anterior and middle mediastinal mass remains evident, similar to the recent previous exams. Right upper lobe parenchymal mass seen at CT best discernible on the lateral view. Heart size is normal. There is aortic atherosclerosis. The pulmonary vascularity is normal. Peripheral lung parenchyma is clear. No effusions. No acute bone finding. IMPRESSION: Re- demonstration of large anterior and middle mediastinal mass. No change radiographically. Previously demonstrated right upper lobe parenchymal lesion visible on the lateral view. Electronically Signed   By: Nelson Chimes M.D.   On: 03/15/2016 13:57   Dg Chest 2 View  Addendum Date: 03/10/2016   ADDENDUM REPORT: 03/10/2016 17:10 ADDENDUM: The original report was by Dr. Van Clines. The following addendum is by  Dr. Van Clines: These results were called by telephone at the time of interpretation on 03/10/2016 at 5:02 pm to Dr. Tenna Delaine , who verbally acknowledged these results. Electronically Signed   By: Van Clines M.D.   On: 03/10/2016 17:10   Result Date: 03/10/2016 CLINICAL DATA:  Nonproductive cough.  Difficulty swallowing. EXAM: CHEST  2 VIEW COMPARISON:  02/20/2012 FINDINGS: Abnormal right lower paratracheal stripe, thickened the within abnormal right lateral convex margin. Abnormal increased distance between the trachea and the visible aortic arch contour. Atherosclerotic calcification of the aortic arch. Heart size normal. No bony abnormality. Airway thickening is present, suggesting bronchitis or reactive airways disease. IMPRESSION: 1. Masslike density along the anterior aortic arch. Tumor/malignancy, adenopathy, and aortic arch aneurysm are possibilities. I recommend urgent CT angiography of the chest using aortic dissection protocol to assess for tumor and/or saccular aneurysm. 2. Airway thickening is present, suggesting bronchitis or reactive airways disease. Radiology assistant personnel have been notified to put me in telephone contact with the referring physician or the referring physician's clinical representative in order to discuss these findings. Once this communication is established I will issue an addendum to this report for documentation purposes. Electronically Signed: By: Van Clines M.D. On: 03/10/2016 16:52   Ct Angio Chest W/cm &/or Wo Cm  Result Date: 03/10/2016 CLINICAL DATA:  persistent cough, sob, abnormal CXR: "Masslike density along the anterior aortic arch.Tumor/malignancy, adenopathy, and aortic arch aneurysm arepossibilities. I recommend urgent CT angiography of the chest usingaortic dissection protocol to assess for tumor and/or saccularAneurysm." EXAM: CT ANGIOGRAPHY CHEST WITH CONTRAST TECHNIQUE: Multidetector CT imaging of the chest was performed  using the standard protocol during bolus administration of intravenous contrast. Multiplanar CT image reconstructions and MIPs were obtained to evaluate the vascular anatomy. CONTRAST:  100 mL of Isovue 370 intravenous contrast COMPARISON:  Current chest radiograph. FINDINGS: Cardiovascular: No aortic aneurysm or dissection. There is mild partly calcified plaque along the aortic arch and at the origin of the innominate and left subclavian arteries. Heart is normal in size. Mild coronary artery calcifications. Minimal pericardial effusion. There is narrowing of the pulmonary arteries from a large mediastinal mass described below. Mediastinum/Nodes: There is a large mass in the mediastinum. Mass is centered at the level of the AP window just above the [place]. It measures approximately 7.5 x 7.4 x 9.3 cm. The mass surrounds the pulmonary  vasculature and contacts a significant portion of the ascending aorta and aortic arch, surrounding the aortic arch branch vessels. The mass contacts and narrows the right left pulmonary arteries, greater involving the left. Mass extends into the right hilum and is contiguous with a right upper lobe nodule. The right upper lobe nodule measures 2.9 x 1.7 x 2.7 cm. Lungs/Pleura: Right upper lobe nodule described above measuring 2.9 cm in greatest dimension. No other lung nodules. No evidence of pneumonia or edema. There is mild pleural parenchymal scarring at the apices. No pleural effusion or pneumothorax. Upper Abdomen: Low-density right adrenal mass measuring 3.7 x 2.8 x 3.4 cm. Mild thickening of the left adrenal gland. No liver mass or lesion on the included field of view. Musculoskeletal: No osteoblastic or osteolytic lesions. Review of the MIP images confirms the above findings. IMPRESSION: 1. The enlargement noted on the current chest radiograph is due to a large mass, which extends throughout much of the mediastinum encasing the pulmonary arteries and portions of the thoracic  aorta and aortic arch branch vessels. The mass encases and narrows the main pulmonary arteries, left greater than right. Mass is contiguous with a large nodule, borderline mass, in the right upper lobe, measuring 2.9 cm in greatest dimension. Findings are highly suspicious for lung carcinoma with metastatic mediastinal confluent adenopathy. 2. No other lung nodules. No evidence of pneumonia or pulmonary edema. Electronically Signed   By: Lajean Manes M.D.   On: 03/10/2016 20:45      IMPRESSION/PLAN: 1. 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 Dr. Tammi Klippel discussed the findings on her examination as well as of her CT imaging. We discussed the rationale for radiotherapy to the chest. He discusses the palliative nature of this treatment, and Dr. Tammi Klippel has outlined the  recommendations 10 total treatments. We discussed the risks, benefits, short, and long term effects of radiotherapy, and the patient is interested in proceeding. She will be set up for CT simulation today, and anticipate starting treatment today. We also discussed the role of PCI radiotherapy in the future, and we will follow up with the results of her brain MRI and PET scan which are scheduled for next week. 2. Hypokalemia. The patient is trying to increase her po intake of potassium in addition to being managed for this by Dr. Julien Nordmann.    The above documentation reflects my direct findings during this shared patient visit. Please see the separate note by Dr. Tammi Klippel on this date for the remainder of the patient's plan of care.   Carola Rhine, PAC  This document serves as a record of services personally performed by Shona Simpson, PAC and Tyler Pita, MD. It was created on his behalf by Arlyce Harman, a trained medical scribe. The creation of this record is based on  the scribe's personal observations and the provider's statements to them. This document has been checked and approved by the attending provider.

## 2016-03-18 NOTE — Progress Notes (Signed)
  Radiation Oncology         [phone] ________________________________  Name: [name] MRN: [id number]  Date: 03/18/2016  DOB: 1950/08/12  SIMULATION AND TREATMENT PLANNING NOTE    ICD-9-CM ICD-10-CM   1. Small cell carcinoma of lung, right (HCC) 162.9 C34.91     DIAGNOSIS:  65 y.o. female at high risk for SVC syndrome in the setting of extensive stage small cell carcinoma of the right upper lobe and involving the mediastinal lymph nodes  NARRATIVE:  The patient was brought to the [place].  Identity was confirmed.  All relevant records and images related to the planned course of therapy were reviewed.  The patient freely provided informed written consent to proceed with treatment after reviewing the details related to the planned course of therapy. The consent form was witnessed and verified by the simulation staff.  Then, the patient was set-up in a stable reproducible  supine position for radiation therapy.  CT images were obtained.  Surface markings were placed.  The CT images were loaded into the planning software.  Then the target and avoidance structures were contoured.  Treatment planning then occurred.  The radiation prescription was entered and confirmed.  Then, I designed and supervised the construction of a total of 3 medically necessary complex treatment devices, including a BodyFix immobilization mold custom fitted to the patient along with 2 multileaf collimators conformally shaped radiation around the treatment target while shielding critical structures such as the heart and spinal cord maximally.  I have requested : 3D Simulation  I have requested a DVH of the following structures: Left lung, right lung, spinal cord, heart, esophagus, and target.  I have ordered:Nutrition Consult  SPECIAL TREATMENT PROCEDURE:  The planned course of therapy using radiation constitutes a special treatment procedure. Special care is required in the management of this patient  for the following reasons.  The patient will be receiving concurrent chemotherapy requiring careful monitoring for increased toxicities of treatment including periodic laboratory values.  The special nature of the planned course of radiotherapy will require increased physician supervision and oversight to ensure patient's safety with optimal treatment outcomes.  PLAN:  The patient will receive 30 Gy in 10 fractions.  ________________________________  Sheral Apley Tammi Klippel, M.D.  This document serves as a record of services personally performed by Tyler Pita, MD. It was created on his behalf by Arlyce Harman, a trained medical scribe. The creation of this record is based on the scribe's personal observations and the provider's statements to them. This document has been checked and approved by the attending provider.

## 2016-03-18 NOTE — Addendum Note (Signed)
Encounter addended by: Hayden Pedro, PA-C on: 03/18/2016 12:41 PM<BR>    Actions taken: Sign clinical note

## 2016-03-19 ENCOUNTER — Encounter: Payer: Self-pay | Admitting: Radiation Oncology

## 2016-03-19 ENCOUNTER — Inpatient Hospital Stay
Admission: RE | Admit: 2016-03-19 | Discharge: 2016-03-19 | Disposition: A | Discharge status: Home / Self Care | Payer: Self-pay | Source: Ambulatory Visit | Attending: Radiation Oncology | Admitting: Radiation Oncology

## 2016-03-19 ENCOUNTER — Ambulatory Visit
Admission: RE | Admit: 2016-03-19 | Discharge: 2016-03-19 | Disposition: A | Discharge status: Home / Self Care | Payer: No Typology Code available for payment source | Source: Ambulatory Visit | Attending: Radiation Oncology | Admitting: Radiation Oncology

## 2016-03-19 VITALS — BP 129/88 | HR 103 | Temp 98.2°F | Resp 18 | Ht 62.0 in | Wt 147.4 lb

## 2016-03-19 DIAGNOSIS — C3491 Malignant neoplasm of unspecified part of right bronchus or lung: Secondary | ICD-10-CM | POA: Diagnosis not present

## 2016-03-19 MED ORDER — SONAFINE EX EMUL
1.0000 "application " | Freq: Two times a day (BID) | CUTANEOUS | Status: DC
  Administered 2016-03-19: 1 via TOPICAL

## 2016-03-19 NOTE — Progress Notes (Signed)
   Weight and vital signs are stable. Alert and oriented x 3.Respiratory complaints, if any:  SOB with exertion,coughing with clear to brownish stain secretion and has some swallowing difficulty while taking medication. Denies sore throat.  Hemoptysis, if any: No  Pain issues, if any: No   Patient teaching done today and sonafine given with instructions see education area.  Swelling left side of face gone. Wt Readings from Last 3 Encounters:  03/19/16 147 lb 6.4 oz (66.9 kg)  03/18/16 147 lb 12.8 oz (67 kg)  03/17/16 148 lb 9.6 oz (67.4 kg)   BP 129/88 (BP Location: Right Arm, Patient Position: Sitting, Cuff Size: Normal)   Pulse (!) 103   Temp 98.2 F (36.8 C) (Oral)   Resp 18   Ht '5\' 2"'$  (1.575 m)   Wt 147 lb 6.4 oz (66.9 kg)   SpO2 (!) 18%   BMI 26.96 kg/m

## 2016-03-19 NOTE — Progress Notes (Signed)
  Radiation Oncology         [phone]   Name: [name] MRN: [id number]   Date: 03/19/2016  DOB: May 31, 1951   Weekly Radiation Therapy Management    ICD-9-CM ICD-10-CM   1. Small cell carcinoma of lung, right ([facility]) 162.9 C34.91 DISCONTINUED: SONAFINE emulsion 1 application    Current Dose: 6 Gy  Planned Dose:  30 Gy  Narrative The patient presents for routine under treatment assessment.  Weight and vital signs are stable. Alert and oriented x 3. SOB with exertion, coughing with clear to brownish stain secretion, and has some swallowing difficulty while taking medication. Denies a sore throat. Denies hemoptysis. Denies pain during the day, but reports pain during the night. Denies becoming SOB when she bends down.  Set-up films were reviewed. The chart was checked.  Physical Findings  height is '5\' 2"'$  (1.575 m) and weight is 147 lb 6.4 oz (66.9 kg). Her oral temperature is 98.2 F (36.8 C). Her blood pressure is 129/88 and her pulse is 103 (abnormal). Her respiration is 18 and oxygen saturation is 18% (abnormal). . Weight essentially stable.  No significant changes. No veins visible in the neck or chest. No swelling.  Impression The patient is tolerating radiation.  Plan Continue treatment as planned.         Sheral Apley Tammi Klippel, M.D.  This document serves as a record of services personally performed by Tyler Pita, MD. It was created on his behalf by Darcus Austin, a trained medical scribe. The creation of this record is based on the scribe's personal observations and the provider's statements to them. This document has been checked and approved by the attending provider.

## 2016-03-22 ENCOUNTER — Ambulatory Visit: Payer: No Typology Code available for payment source

## 2016-03-22 ENCOUNTER — Telehealth: Payer: Self-pay

## 2016-03-22 ENCOUNTER — Ambulatory Visit
Admission: RE | Admit: 2016-03-22 | Discharge: 2016-03-22 | Disposition: A | Discharge status: Home / Self Care | Payer: No Typology Code available for payment source | Source: Ambulatory Visit | Attending: Radiation Oncology | Admitting: Radiation Oncology

## 2016-03-22 ENCOUNTER — Ambulatory Visit: Payer: No Typology Code available for payment source | Admitting: Radiation Oncology

## 2016-03-22 ENCOUNTER — Encounter: Payer: Self-pay | Admitting: Radiation Oncology

## 2016-03-22 DIAGNOSIS — C3491 Malignant neoplasm of unspecified part of right bronchus or lung: Secondary | ICD-10-CM | POA: Diagnosis not present

## 2016-03-22 NOTE — Telephone Encounter (Signed)
Spoke with [employer] Rep to provide additional clinical information CPT approval code [id number] Retro Approved from 03/10/16- 06/08/16

## 2016-03-22 NOTE — Progress Notes (Signed)
Paperwork ([employer]) received, given to nurse 10/9

## 2016-03-22 NOTE — Telephone Encounter (Signed)
Insurance rep handling pending CT authorization called needed more info. She needs to speak with someone in clinical. Procedure has already been preformed.   Dara: [phone] [id number]

## 2016-03-22 NOTE — Addendum Note (Signed)
Encounter addended by: Malena Edman, RN on: 03/22/2016 12:09 PM<BR>    Actions taken: Order Reconciliation Section accessed

## 2016-03-22 NOTE — Addendum Note (Signed)
Encounter addended by: Malena Edman, RN on: 03/22/2016 12:03 PM<BR>    Actions taken: Order Reconciliation Section accessed, Home Medications modified

## 2016-03-23 ENCOUNTER — Ambulatory Visit (HOSPITAL_BASED_OUTPATIENT_CLINIC_OR_DEPARTMENT_OTHER): Payer: No Typology Code available for payment source

## 2016-03-23 ENCOUNTER — Ambulatory Visit
Admission: RE | Admit: 2016-03-23 | Discharge: 2016-03-23 | Disposition: A | Discharge status: Home / Self Care | Payer: No Typology Code available for payment source | Source: Ambulatory Visit | Attending: Radiation Oncology | Admitting: Radiation Oncology

## 2016-03-23 ENCOUNTER — Other Ambulatory Visit (HOSPITAL_BASED_OUTPATIENT_CLINIC_OR_DEPARTMENT_OTHER): Payer: No Typology Code available for payment source

## 2016-03-23 DIAGNOSIS — Z5111 Encounter for antineoplastic chemotherapy: Secondary | ICD-10-CM

## 2016-03-23 DIAGNOSIS — C3411 Malignant neoplasm of upper lobe, right bronchus or lung: Secondary | ICD-10-CM | POA: Diagnosis not present

## 2016-03-23 DIAGNOSIS — C781 Secondary malignant neoplasm of mediastinum: Secondary | ICD-10-CM

## 2016-03-23 DIAGNOSIS — C3491 Malignant neoplasm of unspecified part of right bronchus or lung: Secondary | ICD-10-CM

## 2016-03-23 DIAGNOSIS — C7989 Secondary malignant neoplasm of other specified sites: Secondary | ICD-10-CM

## 2016-03-23 LAB — COMPREHENSIVE METABOLIC PANEL
ALBUMIN: 3.4 g/dL — AB (ref 3.5–5.0)
ALK PHOS: 66 U/L (ref 40–150)
ALT: 29 U/L (ref 0–55)
AST: 22 U/L (ref 5–34)
Anion Gap: 11 mEq/L (ref 3–11)
BUN: 9.2 mg/dL (ref 7.0–26.0)
CALCIUM: 9.7 mg/dL (ref 8.4–10.4)
CO2: 27 mEq/L (ref 22–29)
CREATININE: 0.7 mg/dL (ref 0.6–1.1)
Chloride: 97 mEq/L — ABNORMAL LOW (ref 98–109)
EGFR: 86 mL/min/{1.73_m2} — ABNORMAL LOW (ref >90)
Glucose: 127 mg/dl (ref 70–140)
Potassium: 3.4 mEq/L — ABNORMAL LOW (ref 3.5–5.1)
Sodium: 136 mEq/L (ref 136–145)
Total Bilirubin: <0.3 mg/dL (ref 0.20–1.20)
Total Protein: 7.2 g/dL (ref 6.4–8.3)

## 2016-03-23 LAB — CBC WITH DIFFERENTIAL/PLATELET
BASO%: 0.2 % (ref 0.0–2.0)
Basophils Absolute: 0 10*3/uL (ref 0.0–0.1)
EOS%: 1 % (ref 0.0–7.0)
Eosinophils Absolute: 0.1 10*3/uL (ref 0.0–0.5)
HEMATOCRIT: 38.1 % (ref 34.8–46.6)
HEMOGLOBIN: 13 g/dL (ref 11.6–15.9)
LYMPH#: 1.7 10*3/uL (ref 0.9–3.3)
LYMPH%: 21 % (ref 14.0–49.7)
MCH: 29.2 pg (ref 25.1–34.0)
MCHC: 34.1 g/dL (ref 31.5–36.0)
MCV: 85.6 fL (ref 79.5–101.0)
MONO#: 0.6 10*3/uL (ref 0.1–0.9)
MONO%: 7.3 % (ref 0.0–14.0)
NEUT#: 5.8 10*3/uL (ref 1.5–6.5)
NEUT%: 70.5 % (ref 38.4–76.8)
Platelets: 216 10*3/uL (ref 145–400)
RBC: 4.45 10*6/uL (ref 3.70–5.45)
RDW: 13.2 % (ref 11.2–14.5)
WBC: 8.3 10*3/uL (ref 3.9–10.3)

## 2016-03-23 LAB — TECHNOLOGIST REVIEW

## 2016-03-23 MED ORDER — SODIUM CHLORIDE 0.9 % IV SOLN
100.0000 mg/m2 | Freq: Once | INTRAVENOUS | Status: AC
  Administered 2016-03-23: 170 mg via INTRAVENOUS
  Filled 2016-03-23: qty 8.5

## 2016-03-23 MED ORDER — PALONOSETRON HCL INJECTION 0.25 MG/5ML
0.2500 mg | Freq: Once | INTRAVENOUS | Status: AC
  Administered 2016-03-23: 0.25 mg via INTRAVENOUS

## 2016-03-23 MED ORDER — PALONOSETRON HCL INJECTION 0.25 MG/5ML
INTRAVENOUS | Status: AC
  Filled 2016-03-23: qty 5

## 2016-03-23 MED ORDER — SODIUM CHLORIDE 0.9 % IV SOLN
508.0000 mg | Freq: Once | INTRAVENOUS | Status: AC
  Administered 2016-03-23: 510 mg via INTRAVENOUS
  Filled 2016-03-23: qty 51

## 2016-03-23 MED ORDER — SODIUM CHLORIDE 0.9 % IV SOLN
10.0000 mg | Freq: Once | INTRAVENOUS | Status: AC
  Administered 2016-03-23: 10 mg via INTRAVENOUS
  Filled 2016-03-23: qty 1

## 2016-03-23 MED ORDER — SODIUM CHLORIDE 0.9 % IV SOLN
Freq: Once | INTRAVENOUS | Status: AC
  Administered 2016-03-23: 15:00:00 via INTRAVENOUS

## 2016-03-23 NOTE — Patient Instructions (Addendum)
[facility] Discharge Instructions for Patients Receiving Chemotherapy  Today you received the following chemotherapy agents:  Carboplatin and Etoposide.  To help prevent nausea and vomiting after your treatment, we encourage you to take your nausea medication as directed.   If you develop nausea and vomiting that is not controlled by your nausea medication, call the clinic.   BELOW ARE SYMPTOMS THAT SHOULD BE REPORTED IMMEDIATELY:  *FEVER GREATER THAN 100.5 F  *CHILLS WITH OR WITHOUT FEVER  NAUSEA AND VOMITING THAT IS NOT CONTROLLED WITH YOUR NAUSEA MEDICATION  *UNUSUAL SHORTNESS OF BREATH  *UNUSUAL BRUISING OR BLEEDING  TENDERNESS IN MOUTH AND THROAT WITH OR WITHOUT PRESENCE OF ULCERS  *URINARY PROBLEMS  *BOWEL PROBLEMS  UNUSUAL RASH Items with * indicate a potential emergency and should be followed up as soon as possible.  Feel free to call the clinic you have any questions or concerns. The clinic phone number is [phone].  Please show the [facility] at check-in to the Emergency Department and triage nurse.  Carboplatin injection What is this medicine? CARBOPLATIN (KAR boe pla tin) is a chemotherapy drug. It targets fast dividing cells, like cancer cells, and causes these cells to die. This medicine is used to treat ovarian cancer and many other cancers. This medicine may be used for other purposes; ask your health care provider or pharmacist if you have questions. What should I tell my health care provider before I take this medicine? They need to know if you have any of these conditions: -blood disorders -hearing problems -kidney disease -recent or ongoing radiation therapy -an unusual or allergic reaction to carboplatin, cisplatin, other chemotherapy, other medicines, foods, dyes, or preservatives -pregnant or trying to get pregnant -breast-feeding How should I use this medicine? This drug is usually given as an infusion into a vein. It  is administered in a hospital or clinic by a specially trained health care professional. Talk to your pediatrician regarding the use of this medicine in children. Special care may be needed. Overdosage: If you think you have taken too much of this medicine contact a poison control center or emergency room at once. NOTE: This medicine is only for you. Do not share this medicine with others. What if I miss a dose? It is important not to miss a dose. Call your doctor or health care professional if you are unable to keep an appointment. What may interact with this medicine? -medicines for seizures -medicines to increase blood counts like filgrastim, pegfilgrastim, sargramostim -some antibiotics like amikacin, gentamicin, neomycin, streptomycin, tobramycin -vaccines Talk to your doctor or health care professional before taking any of these medicines: -acetaminophen -aspirin -ibuprofen -ketoprofen -naproxen This list may not describe all possible interactions. Give your health care provider a list of all the medicines, herbs, non-prescription drugs, or dietary supplements you use. Also tell them if you smoke, drink alcohol, or use illegal drugs. Some items may interact with your medicine. What should I watch for while using this medicine? Your condition will be monitored carefully while you are receiving this medicine. You will need important blood work done while you are taking this medicine. This drug may make you feel generally unwell. This is not uncommon, as chemotherapy can affect healthy cells as well as cancer cells. Report any side effects. Continue your course of treatment even though you feel ill unless your doctor tells you to stop. In some cases, you may be given additional medicines to help with side effects. Follow all directions for  their use. Call your doctor or health care professional for advice if you get a fever, chills or sore throat, or other symptoms of a cold or flu. Do not  treat yourself. This drug decreases your body's ability to fight infections. Try to avoid being around people who are sick. This medicine may increase your risk to bruise or bleed. Call your doctor or health care professional if you notice any unusual bleeding. Be careful brushing and flossing your teeth or using a toothpick because you may get an infection or bleed more easily. If you have any dental work done, tell your dentist you are receiving this medicine. Avoid taking products that contain aspirin, acetaminophen, ibuprofen, naproxen, or ketoprofen unless instructed by your doctor. These medicines may hide a fever. Do not become pregnant while taking this medicine. Women should inform their doctor if they wish to become pregnant or think they might be pregnant. There is a potential for serious side effects to an unborn child. Talk to your health care professional or pharmacist for more information. Do not breast-feed an infant while taking this medicine. What side effects may I notice from receiving this medicine? Side effects that you should report to your doctor or health care professional as soon as possible: -allergic reactions like skin rash, itching or hives, swelling of the face, lips, or tongue -signs of infection - fever or chills, cough, sore throat, pain or difficulty passing urine -signs of decreased platelets or bleeding - bruising, pinpoint red spots on the skin, black, tarry stools, nosebleeds -signs of decreased red blood cells - unusually weak or tired, fainting spells, lightheadedness -breathing problems -changes in hearing -changes in vision -chest pain -high blood pressure -low blood counts - This drug may decrease the number of white blood cells, red blood cells and platelets. You may be at increased risk for infections and bleeding. -nausea and vomiting -pain, swelling, redness or irritation at the injection site -pain, tingling, numbness in the hands or feet -problems  with balance, talking, walking -trouble passing urine or change in the amount of urine Side effects that usually do not require medical attention (report to your doctor or health care professional if they continue or are bothersome): -hair loss -loss of appetite -metallic taste in the mouth or changes in taste This list may not describe all possible side effects. Call your doctor for medical advice about side effects. You may report side effects to FDA at 1-800-FDA-1088. Where should I keep my medicine? This drug is given in a hospital or clinic and will not be stored at home. NOTE: This sheet is a summary. It may not cover all possible information. If you have questions about this medicine, talk to your doctor, pharmacist, or health care provider.    2016, Elsevier/Gold Standard. (2007-09-05 14:38:05)  Etoposide, VP-16 injection What is this medicine? ETOPOSIDE, VP-16 (e toe POE side) is a chemotherapy drug. It is used to treat testicular cancer, lung cancer, and other cancers. This medicine may be used for other purposes; ask your health care provider or pharmacist if you have questions. What should I tell my health care provider before I take this medicine? They need to know if you have any of these conditions: -infection -kidney disease -low blood counts, like low white cell, platelet, or red cell counts -an unusual or allergic reaction to etoposide, other chemotherapeutic agents, other medicines, foods, dyes, or preservatives -pregnant or trying to get pregnant -breast-feeding How should I use this medicine? This medicine is for  infusion into a vein. It is administered in a hospital or clinic by a specially trained health care professional. Talk to your pediatrician regarding the use of this medicine in children. Special care may be needed. Overdosage: If you think you have taken too much of this medicine contact a poison control center or emergency room at once. NOTE: This  medicine is only for you. Do not share this medicine with others. What if I miss a dose? It is important not to miss your dose. Call your doctor or health care professional if you are unable to keep an appointment. What may interact with this medicine? -aspirin -certain medications for seizures like carbamazepine, phenobarbital, phenytoin, valproic acid -cyclosporine -levamisole -warfarin This list may not describe all possible interactions. Give your health care provider a list of all the medicines, herbs, non-prescription drugs, or dietary supplements you use. Also tell them if you smoke, drink alcohol, or use illegal drugs. Some items may interact with your medicine. What should I watch for while using this medicine? Visit your doctor for checks on your progress. This drug may make you feel generally unwell. This is not uncommon, as chemotherapy can affect healthy cells as well as cancer cells. Report any side effects. Continue your course of treatment even though you feel ill unless your doctor tells you to stop. In some cases, you may be given additional medicines to help with side effects. Follow all directions for their use. Call your doctor or health care professional for advice if you get a fever, chills or sore throat, or other symptoms of a cold or flu. Do not treat yourself. This drug decreases your body's ability to fight infections. Try to avoid being around people who are sick. This medicine may increase your risk to bruise or bleed. Call your doctor or health care professional if you notice any unusual bleeding. Be careful brushing and flossing your teeth or using a toothpick because you may get an infection or bleed more easily. If you have any dental work done, tell your dentist you are receiving this medicine. Avoid taking products that contain aspirin, acetaminophen, ibuprofen, naproxen, or ketoprofen unless instructed by your doctor. These medicines may hide a fever. Do not  become pregnant while taking this medicine or for at least 6 months after stopping it. Women should inform their doctor if they wish to become pregnant or think they might be pregnant. Women of child-bearing potential will need to have a negative pregnancy test before starting this medicine. There is a potential for serious side effects to an unborn child. Talk to your health care professional or pharmacist for more information. Do not breast-feed an infant while taking this medicine. Men must use a latex condom during sexual contact with a woman while taking this medicine and for at least 4 months after stopping it. A latex condom is needed even if you have had a vasectomy. Contact your doctor right away if your partner becomes pregnant. Do not donate sperm while taking this medicine and for at least 4 months after you stop taking this medicine. Men should inform their doctors if they wish to father a child. This medicine may lower sperm counts. What side effects may I notice from receiving this medicine? Side effects that you should report to your doctor or health care professional as soon as possible: -allergic reactions like skin rash, itching or hives, swelling of the face, lips, or tongue -low blood counts - this medicine may decrease the number of white  blood cells, red blood cells and platelets. You may be at increased risk for infections and bleeding. -signs of infection - fever or chills, cough, sore throat, pain or difficulty passing urine -signs of decreased platelets or bleeding - bruising, pinpoint red spots on the skin, black, tarry stools, blood in the urine -signs of decreased red blood cells - unusually weak or tired, fainting spells, lightheadedness -breathing problems -changes in vision -mouth or throat sores or ulcers -pain, redness, swelling or irritation at the injection site -pain, tingling, numbness in the hands or feet -redness, blistering, peeling or loosening of the skin,  including inside the mouth -seizures -vomiting Side effects that usually do not require medical attention (report to your doctor or health care professional if they continue or are bothersome): -diarrhea -hair loss -loss of appetite -nausea -stomach pain This list may not describe all possible side effects. Call your doctor for medical advice about side effects. You may report side effects to FDA at 1-800-FDA-1088. Where should I keep my medicine? This drug is given in a hospital or clinic and will not be stored at home. NOTE: This sheet is a summary. It may not cover all possible information. If you have questions about this medicine, talk to your doctor, pharmacist, or health care provider.    2016, Elsevier/Gold Standard. (2014-01-24 12:32:50)

## 2016-03-24 ENCOUNTER — Ambulatory Visit: Payer: No Typology Code available for payment source | Admitting: Internal Medicine

## 2016-03-24 ENCOUNTER — Other Ambulatory Visit: Payer: No Typology Code available for payment source

## 2016-03-24 ENCOUNTER — Telehealth: Payer: Self-pay | Admitting: Medical Oncology

## 2016-03-24 ENCOUNTER — Ambulatory Visit
Admission: RE | Admit: 2016-03-24 | Discharge: 2016-03-24 | Disposition: A | Discharge status: Home / Self Care | Payer: No Typology Code available for payment source | Source: Ambulatory Visit | Attending: Radiation Oncology | Admitting: Radiation Oncology

## 2016-03-24 ENCOUNTER — Ambulatory Visit (HOSPITAL_BASED_OUTPATIENT_CLINIC_OR_DEPARTMENT_OTHER): Payer: No Typology Code available for payment source

## 2016-03-24 VITALS — BP 147/81 | HR 100 | Temp 98.3°F | Resp 22

## 2016-03-24 DIAGNOSIS — Z5111 Encounter for antineoplastic chemotherapy: Secondary | ICD-10-CM | POA: Diagnosis not present

## 2016-03-24 DIAGNOSIS — C3411 Malignant neoplasm of upper lobe, right bronchus or lung: Secondary | ICD-10-CM | POA: Diagnosis not present

## 2016-03-24 DIAGNOSIS — C3491 Malignant neoplasm of unspecified part of right bronchus or lung: Secondary | ICD-10-CM

## 2016-03-24 DIAGNOSIS — C7989 Secondary malignant neoplasm of other specified sites: Secondary | ICD-10-CM | POA: Diagnosis not present

## 2016-03-24 DIAGNOSIS — C781 Secondary malignant neoplasm of mediastinum: Secondary | ICD-10-CM

## 2016-03-24 MED ORDER — SODIUM CHLORIDE 0.9 % IV SOLN
Freq: Once | INTRAVENOUS | Status: AC
  Administered 2016-03-24: 15:00:00 via INTRAVENOUS

## 2016-03-24 MED ORDER — SODIUM CHLORIDE 0.9 % IV SOLN
10.0000 mg | Freq: Once | INTRAVENOUS | Status: AC
  Administered 2016-03-24: 10 mg via INTRAVENOUS
  Filled 2016-03-24: qty 1

## 2016-03-24 MED ORDER — SODIUM CHLORIDE 0.9 % IV SOLN
100.0000 mg/m2 | Freq: Once | INTRAVENOUS | Status: AC
  Administered 2016-03-24: 170 mg via INTRAVENOUS
  Filled 2016-03-24: qty 8.5

## 2016-03-24 NOTE — Telephone Encounter (Signed)
Pt getting day 2 chemo in infusion. Per Infusion nurse pt says she feels fine.

## 2016-03-24 NOTE — Patient Instructions (Signed)
[facility] Discharge Instructions for Patients Receiving Chemotherapy  Today you received the following chemotherapy agents: Etoposide   To help prevent nausea and vomiting after your treatment, we encourage you to take your nausea medication as directed.    If you develop nausea and vomiting that is not controlled by your nausea medication, call the clinic.   BELOW ARE SYMPTOMS THAT SHOULD BE REPORTED IMMEDIATELY:  *FEVER GREATER THAN 100.5 F  *CHILLS WITH OR WITHOUT FEVER  NAUSEA AND VOMITING THAT IS NOT CONTROLLED WITH YOUR NAUSEA MEDICATION  *UNUSUAL SHORTNESS OF BREATH  *UNUSUAL BRUISING OR BLEEDING  TENDERNESS IN MOUTH AND THROAT WITH OR WITHOUT PRESENCE OF ULCERS  *URINARY PROBLEMS  *BOWEL PROBLEMS  UNUSUAL RASH Items with * indicate a potential emergency and should be followed up as soon as possible.  Feel free to call the clinic you have any questions or concerns. The clinic phone number is [phone].  Please show the CHEMO ALERT CARD at check-in to the Emergency Department and triage nurse.   

## 2016-03-25 ENCOUNTER — Ambulatory Visit
Admission: RE | Admit: 2016-03-25 | Discharge: 2016-03-25 | Disposition: A | Discharge status: Home / Self Care | Payer: No Typology Code available for payment source | Source: Ambulatory Visit | Attending: Radiation Oncology | Admitting: Radiation Oncology

## 2016-03-25 ENCOUNTER — Ambulatory Visit (HOSPITAL_BASED_OUTPATIENT_CLINIC_OR_DEPARTMENT_OTHER): Payer: No Typology Code available for payment source

## 2016-03-25 VITALS — BP 126/78 | HR 100 | Temp 98.2°F | Resp 18

## 2016-03-25 DIAGNOSIS — Z5111 Encounter for antineoplastic chemotherapy: Secondary | ICD-10-CM | POA: Diagnosis not present

## 2016-03-25 DIAGNOSIS — C3491 Malignant neoplasm of unspecified part of right bronchus or lung: Secondary | ICD-10-CM | POA: Diagnosis not present

## 2016-03-25 DIAGNOSIS — C7989 Secondary malignant neoplasm of other specified sites: Secondary | ICD-10-CM | POA: Diagnosis not present

## 2016-03-25 DIAGNOSIS — C781 Secondary malignant neoplasm of mediastinum: Secondary | ICD-10-CM | POA: Diagnosis not present

## 2016-03-25 DIAGNOSIS — C3411 Malignant neoplasm of upper lobe, right bronchus or lung: Secondary | ICD-10-CM | POA: Diagnosis not present

## 2016-03-25 MED ORDER — SODIUM CHLORIDE 0.9 % IV SOLN
100.0000 mg/m2 | Freq: Once | INTRAVENOUS | Status: AC
  Administered 2016-03-25: 170 mg via INTRAVENOUS
  Filled 2016-03-25: qty 8.5

## 2016-03-25 MED ORDER — SODIUM CHLORIDE 0.9 % IV SOLN
10.0000 mg | Freq: Once | INTRAVENOUS | Status: AC
  Administered 2016-03-25: 10 mg via INTRAVENOUS
  Filled 2016-03-25: qty 1

## 2016-03-25 MED ORDER — SODIUM CHLORIDE 0.9 % IV SOLN
Freq: Once | INTRAVENOUS | Status: AC
  Administered 2016-03-25: 14:00:00 via INTRAVENOUS

## 2016-03-25 NOTE — Patient Instructions (Signed)
[facility] Discharge Instructions for Patients Receiving Chemotherapy  Today you received the following chemotherapy agents: Etoposide   To help prevent nausea and vomiting after your treatment, we encourage you to take your nausea medication as directed.    If you develop nausea and vomiting that is not controlled by your nausea medication, call the clinic.   BELOW ARE SYMPTOMS THAT SHOULD BE REPORTED IMMEDIATELY:  *FEVER GREATER THAN 100.5 F  *CHILLS WITH OR WITHOUT FEVER  NAUSEA AND VOMITING THAT IS NOT CONTROLLED WITH YOUR NAUSEA MEDICATION  *UNUSUAL SHORTNESS OF BREATH  *UNUSUAL BRUISING OR BLEEDING  TENDERNESS IN MOUTH AND THROAT WITH OR WITHOUT PRESENCE OF ULCERS  *URINARY PROBLEMS  *BOWEL PROBLEMS  UNUSUAL RASH Items with * indicate a potential emergency and should be followed up as soon as possible.  Feel free to call the clinic you have any questions or concerns. The clinic phone number is [phone].  Please show the CHEMO ALERT CARD at check-in to the Emergency Department and triage nurse.   

## 2016-03-26 ENCOUNTER — Ambulatory Visit (HOSPITAL_COMMUNITY)
Admission: RE | Admit: 2016-03-26 | Discharge: 2016-03-26 | Disposition: A | Discharge status: Home / Self Care | Payer: No Typology Code available for payment source | Source: Ambulatory Visit | Attending: Internal Medicine | Admitting: Internal Medicine

## 2016-03-26 ENCOUNTER — Ambulatory Visit
Admission: RE | Admit: 2016-03-26 | Discharge: 2016-03-26 | Disposition: A | Discharge status: Home / Self Care | Payer: No Typology Code available for payment source | Source: Ambulatory Visit | Attending: Radiation Oncology | Admitting: Radiation Oncology

## 2016-03-26 ENCOUNTER — Encounter: Payer: Self-pay | Admitting: Radiation Oncology

## 2016-03-26 VITALS — BP 130/78 | HR 110 | Temp 98.5°F | Resp 18 | Wt 148.6 lb

## 2016-03-26 DIAGNOSIS — C7951 Secondary malignant neoplasm of bone: Secondary | ICD-10-CM | POA: Insufficient documentation

## 2016-03-26 DIAGNOSIS — C3491 Malignant neoplasm of unspecified part of right bronchus or lung: Secondary | ICD-10-CM

## 2016-03-26 DIAGNOSIS — I2510 Atherosclerotic heart disease of native coronary artery without angina pectoris: Secondary | ICD-10-CM | POA: Insufficient documentation

## 2016-03-26 DIAGNOSIS — R918 Other nonspecific abnormal finding of lung field: Secondary | ICD-10-CM | POA: Diagnosis present

## 2016-03-26 DIAGNOSIS — I700 Atherosclerosis of aorta: Secondary | ICD-10-CM | POA: Diagnosis not present

## 2016-03-26 LAB — GLUCOSE, CAPILLARY: GLUCOSE-CAPILLARY: 89 mg/dL (ref 65–99)

## 2016-03-26 MED ORDER — FLUDEOXYGLUCOSE F - 18 (FDG) INJECTION
7.3600 | Freq: Once | INTRAVENOUS | Status: AC | PRN
  Administered 2016-03-26: 7.36 via INTRAVENOUS

## 2016-03-26 MED ORDER — GADOBENATE DIMEGLUMINE 529 MG/ML IV SOLN
15.0000 mL | Freq: Once | INTRAVENOUS | Status: AC | PRN
  Administered 2016-03-26: 14 mL via INTRAVENOUS

## 2016-03-26 NOTE — Progress Notes (Signed)
Weekly rad txs chest 7/10 fx completed, occasional cough in am mucous brackish color, no nausea, shortness breath with exertion, appetite good,  occasional  Food gets stuck  , fatihued,  Had MRI and Pet scan today, results in ordered by Dr.Mohamed, Interpreter [name] in with patient  With her son 2:54 PM BP 130/78 (BP Location: Right Arm, Patient Position: Sitting, Cuff Size: Normal)   Pulse (!) 110   Temp 98.5 F (36.9 C) (Oral)   Resp 18   Wt 148 lb 9.6 oz (67.4 kg)   SpO2 100% Comment: room air  BMI 27.18 kg/m   Wt Readings from Last 3 Encounters:  03/26/16 148 lb 9.6 oz (67.4 kg)  03/19/16 147 lb 6.4 oz (66.9 kg)  03/18/16 147 lb 12.8 oz (67 kg)

## 2016-03-26 NOTE — Progress Notes (Signed)
  Radiation Oncology         [phone]   Name: [name] MRN: [id number]   Date: 03/26/2016  DOB: 26-Jun-1950   Weekly Radiation Therapy Management    ICD-9-CM ICD-10-CM   1. 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  Weekly rad txs chest 7/10 fx completed. Patient reports occasional cough in Am, with mucous a brackish color, no nausea, shortness breath with exertion, appetite good,  Occasionally food gets stuck. Patient is fatigued. Patient denies feeling any pain but reports weakness in her arms.  Had MRI and Pet scan today, results in ordered by Dr. Julien Nordmann, Interpreter [name] in with patient. With her son.  Set-up films were reviewed. The chart was checked.  Physical Findings  weight is 148 lb 9.6 oz (67.4 kg). Her oral temperature is 98.5 F (36.9 C). Her blood pressure is 130/78 and her pulse is 110 (abnormal). Her respiration is 18 and oxygen saturation is 100%. . Weight essentially stable.  No significant changes.  Impression The patient is tolerating radiation.  Plan Continue treatment as planned.         Sheral Apley Tammi Klippel, M.D.  This document serves as a record of services personally performed by Tyler Pita, MD. It was created on his behalf by Maryla Morrow, a trained medical scribe. The creation of this record is based on the scribe's personal observations and the provider's statements to them. This document has been checked and approved by the attending provider.

## 2016-03-27 ENCOUNTER — Ambulatory Visit (HOSPITAL_BASED_OUTPATIENT_CLINIC_OR_DEPARTMENT_OTHER): Payer: No Typology Code available for payment source

## 2016-03-27 VITALS — BP 141/91 | HR 106 | Temp 97.8°F | Resp 18

## 2016-03-27 DIAGNOSIS — C3491 Malignant neoplasm of unspecified part of right bronchus or lung: Secondary | ICD-10-CM

## 2016-03-27 DIAGNOSIS — C3411 Malignant neoplasm of upper lobe, right bronchus or lung: Secondary | ICD-10-CM | POA: Diagnosis not present

## 2016-03-27 MED ORDER — PEGFILGRASTIM INJECTION 6 MG/0.6ML ~~LOC~~
6.0000 mg | PREFILLED_SYRINGE | Freq: Once | SUBCUTANEOUS | Status: AC
  Administered 2016-03-27: 6 mg via SUBCUTANEOUS

## 2016-03-27 NOTE — Patient Instructions (Signed)
Pegfilgrastim injection ??? ???????????? ????? ??? ?????????? ????????????? - ??? ??????????????? ???????????????????? ?????? ??????????? ????????, ??????? ??????????? ??????????? ???????????, ?????? ?? ????? ??????????, ??????? ?????? ?????? ???? ? ??????????? ??????????? ????????? ????????????? ?????????. ?? ??????????? ??? ???????? ??????? ???????? ????????? ? ???????? ? ????????? ? ????????????? ?????? ??????????????? ????????, ??????? ???????? ???? ????????????, ??????????? ??????? ?? ??????? ????, ? ????? ??? ?????????? ????????????????? ????? ????? ??????? ??????? ? ??????? ?????. ??? ????????? ????? ??????????? ? ?????? ?????; ???? ? ??? ????????? ???????, ?????????? ? ???????????? ????????? ??? ??????????. ??? ??? ??????? ?????????? ???????????? ????????? ?? ?????? ?????? ????? ?????????? ?????????? ?????, ???? ?? ? ??? ???? ?? ????????? ?????????: -??????????? ?????; -???????? ?? ??????; -??????????? ????? ??????? ???????; -??????????-????????? ??????; -?????? ??????? ?? ????????? ???????? (?????? ? ?????? ????????????? ???????? ?? ???? ????????????? ??????????); -????????? ??? ????????????? ??????? ?? ????????????? ??? ??????????; -????????? ??? ????????????? ??????? ?? ?????? ?????????, ??????? ????????, ????????? ??? ???????????; -???????????? ??? ???????????? ????????????; -????????? ??????; ??? ??? ??????? ????????? ??? ?????????? ??? ????????? ????????????? ??? ?????????? ????????. ???? ?? ?????????? ??? ????????? ? ???????? ????????, ?? ???????? ?????????? ?? ??? ????????????? ? ?? ????????????? ?????????????? ??????????? ??????? ??? ???????? ?? ???? ????????????? ??????????. ????????? ???????? ????????? ? ?????????? ?? ?????????? ??????????. ?????????? ????? ? ???????????? ? ?????????? ?????. ?????????? ??? ????????? ????? ?????? ?????????? ???????. ?? ?????????? ??? ????????? ????, ??? ???????. ??? ?????????????? ???? ? ?????? ?????????? ???????? ? ??????????? ????????? ??? ??????  ?????????. ?? ???????????? ?? ? ??????? ??? ??????. ???? ? ??? ??? ?????????? ??? ?????? ?????????, ?????????? ? ?????????? ??? ???????????? ????????? ??? ??? ?????????. ???????? ???????? ? ??????????? ?????????? ????? ????????? ?????. ???????? ?? ??, ??? ??? ????????? ????? ??????????? ??? ????????? ??????????, ??????? ????????? ???? ????????????????. ?????????????: ???? ??? ???????, ??? ?? ????????? ?????????? ???? ????? ?????????, ?????????? ?????????? ? ????????????????? ????? ??? ???????? ????????? ??? ???????? ?????????? ??????. ??????????: ??? ????????? ????????????? ?????? ??? ???. ?? ???????? ?? ? ??????? ??????. ??? ?????????? ? ?????? ???????? ?????? ?????????? ????? ????????? ????????? ?????????. ?????????? ? ????? ??? ??????? ???????????? ?????????, ???? ?? ?????? ??????? ?????????. ???? ?? ?? ?????? ?????? ????????? ??-?? ??????? ??? ?????????? ???????? ?? ???? ????????????? ??????????, ????? ???? ?????????? ?????? ??? ????? ??????, ????????? ???? ?????????????? ??????????? ????? ??? ???????? ???????. ? ??? ??? ????????? ????? ???????? ?? ??????????????? ?????????????? ?? ?????????. ???????????? ????? ??? ??????? ???????????? ????????? ?????? ???? ??????????? ???? ????????, ????????????? ????????, ?????????????? ?????????? ??? ??????? ???????. ????? ???????? ??? ? ???????, ???????????? ??????????? ???????? ??? ??????????. ????????? ???????? ????? ???????? ?? ?????????????? ? ???? ??????????. ???? ???????? ?? ???????? ???? ????????? ??????????????. ???????????? ???????????? ????????? ?????? ???? ??????????? ???? ????????, ????????????? ????????, ?????????????? ?????????? ? ??????? ???????. ????? ???????? ??? ? ???????, ???????????? ??????????? ???????? ??? ??????????. ????????? ???????? ????? ???????? ?? ?????????????? ? ??????????? ???? ??????????. ?? ??? ????? ???????? ???????? ??? ????????????? ????? ?????????? ?? ????? ?????? ????? ????????? ??? ??????? ????????? ??????? ???????  ?????. ???? ??? ????????? ?????????? ???, ?? ??? ?????? ?????????, ???????? ????? ? ???, ??? ?? ?????????? ??? ????????? (?????? ? ?????? ????????????? ???????? ?? ???? ????????????? ??????????). ????? ???????? ??????? ????? ???? ??? ?????? ????? ?????????? ???????? ???????, ? ??????? ??? ??????? ??? ????? ?????? ???????? ????? ??? ??????? ???????????? ?????????: -????????????? ???????, ????? ??? ?????? ????, ???, ??????????, ????????? ????, ??? ??? ?????; -??????????????; -????????? ???????????; -????, ???????????, ??? ??? ??????????? ? ????? ????????; -??????? ???????? ????? ?? ????; -??????? ??? ?????-?????????? ????; -?????? ??? ????????? ???????; -???? ? ??????? ??? ? ????, ? ????? ???? ? ??????? ????????? ???????; -?????????; -??????? ?????????; -??????????? ??? ?????????????? ??? ????????? ?????????? ?????????? ????; ???????? ???????, ??????? ?????? ?? ??????? ???????????? ???????????? ???????? (???????? ????? ??? ??????? ???????????? ?????????, ???? ??? ???????????? ??? ?????????? ??????????): -???? ? ??????; -???????? ????; ???? ???????? ?? ???????? ???? ????????? ???????? ????????. ?????????? ? ????? ?? ??????? ???????????? ???????? ????????. ?? ?????? ???????? ? ???????? ???????? ?  FDA ?? ???????? [phone]. ??? ??????? ??????? ??? ?????????? ??????? ? ??????????? ??? ????? ?????. ??????? ?????????????? ??????????? ?????? ? ???????????? ??? ??????????? 2-8 degrees? (36-46 degreesF). ?? ?????????????. ??????? ? ????????? ???????? ?? ????????? ??????????? ?????. ???????????? ??? ?????????, ???? ??? ?????????? ??? ???????????? ?????? 48 ?????. ??????? ??????????? ??? ???????????????? ????????? ????? ????????? ????? ????????, ?????????? ?? ???????? ??? ????????. ??????????: ???? ???????? ?? ???????? ???? ????????? ????????. ???? ? ??? ????????? ???????, ?????????? ??????? ?????????, ?????????? ? ?????, ?????????? ??? ??????? ???????????? ?????????.    2016, Elsevier/Gold Standard.  (2014-10-11 00:00:00)

## 2016-03-29 ENCOUNTER — Telehealth: Payer: Self-pay | Admitting: Radiation Oncology

## 2016-03-29 ENCOUNTER — Ambulatory Visit
Admission: RE | Admit: 2016-03-29 | Discharge: 2016-03-29 | Disposition: A | Discharge status: Home / Self Care | Payer: No Typology Code available for payment source | Source: Ambulatory Visit | Attending: Radiation Oncology | Admitting: Radiation Oncology

## 2016-03-29 DIAGNOSIS — C3491 Malignant neoplasm of unspecified part of right bronchus or lung: Secondary | ICD-10-CM | POA: Diagnosis not present

## 2016-03-29 NOTE — Telephone Encounter (Signed)
Placed orange folder with complete [employer] Benefit paperwork in Shona Simpson, PA-C inbox to sign.

## 2016-03-30 ENCOUNTER — Ambulatory Visit
Admission: RE | Admit: 2016-03-30 | Discharge: 2016-03-30 | Disposition: A | Discharge status: Home / Self Care | Payer: No Typology Code available for payment source | Source: Ambulatory Visit | Attending: Radiation Oncology | Admitting: Radiation Oncology

## 2016-03-30 ENCOUNTER — Ambulatory Visit (HOSPITAL_BASED_OUTPATIENT_CLINIC_OR_DEPARTMENT_OTHER): Payer: No Typology Code available for payment source | Admitting: Internal Medicine

## 2016-03-30 ENCOUNTER — Encounter: Payer: Self-pay | Admitting: Internal Medicine

## 2016-03-30 ENCOUNTER — Ambulatory Visit (HOSPITAL_BASED_OUTPATIENT_CLINIC_OR_DEPARTMENT_OTHER): Payer: No Typology Code available for payment source

## 2016-03-30 VITALS — BP 138/81 | HR 111 | Temp 98.1°F | Resp 17 | Ht 62.0 in | Wt 146.3 lb

## 2016-03-30 DIAGNOSIS — C3491 Malignant neoplasm of unspecified part of right bronchus or lung: Secondary | ICD-10-CM

## 2016-03-30 DIAGNOSIS — C3411 Malignant neoplasm of upper lobe, right bronchus or lung: Secondary | ICD-10-CM | POA: Diagnosis not present

## 2016-03-30 DIAGNOSIS — C7951 Secondary malignant neoplasm of bone: Secondary | ICD-10-CM | POA: Diagnosis not present

## 2016-03-30 DIAGNOSIS — Z5111 Encounter for antineoplastic chemotherapy: Secondary | ICD-10-CM

## 2016-03-30 HISTORY — DX: Encounter for antineoplastic chemotherapy: Z51.11

## 2016-03-30 LAB — CBC WITH DIFFERENTIAL/PLATELET
BASO%: 0.3 % (ref 0.0–2.0)
BASOS ABS: 0 10*3/uL (ref 0.0–0.1)
EOS%: 1 % (ref 0.0–7.0)
Eosinophils Absolute: 0.1 10*3/uL (ref 0.0–0.5)
HCT: 38.2 % (ref 34.8–46.6)
HGB: 13.2 g/dL (ref 11.6–15.9)
LYMPH%: 17.5 % (ref 14.0–49.7)
MCH: 29.2 pg (ref 25.1–34.0)
MCHC: 34.6 g/dL (ref 31.5–36.0)
MCV: 84.5 fL (ref 79.5–101.0)
MONO#: 0.4 10*3/uL (ref 0.1–0.9)
MONO%: 6.2 % (ref 0.0–14.0)
NEUT#: 5.3 10*3/uL (ref 1.5–6.5)
NEUT%: 75 % (ref 38.4–76.8)
Platelets: 190 10*3/uL (ref 145–400)
RBC: 4.52 10*6/uL (ref 3.70–5.45)
RDW: 12.7 % (ref 11.2–14.5)
WBC: 7.1 10*3/uL (ref 3.9–10.3)
lymph#: 1.2 10*3/uL (ref 0.9–3.3)

## 2016-03-30 LAB — COMPREHENSIVE METABOLIC PANEL
ALT: 35 U/L (ref 0–55)
AST: 22 U/L (ref 5–34)
Albumin: 3.7 g/dL (ref 3.5–5.0)
Alkaline Phosphatase: 95 U/L (ref 40–150)
Anion Gap: 11 mEq/L (ref 3–11)
BUN: 10.7 mg/dL (ref 7.0–26.0)
CHLORIDE: 93 meq/L — AB (ref 98–109)
CO2: 31 mEq/L — ABNORMAL HIGH (ref 22–29)
Calcium: 9.8 mg/dL (ref 8.4–10.4)
Creatinine: 0.7 mg/dL (ref 0.6–1.1)
EGFR: >90 mL/min/{1.73_m2} (ref >90)
GLUCOSE: 100 mg/dL (ref 70–140)
POTASSIUM: 4 meq/L (ref 3.5–5.1)
SODIUM: 134 meq/L — AB (ref 136–145)
Total Bilirubin: 0.49 mg/dL (ref 0.20–1.20)
Total Protein: 7.3 g/dL (ref 6.4–8.3)

## 2016-03-30 NOTE — Progress Notes (Signed)
[facility] [phone]   [phone]  OFFICE PROGRESS NOTE  Madeline Douglas, [name] [address] [place] [address]  DIAGNOSIS: Extensive stage (T1b, N3, M1b) small cell lung cancer presented with right upper lobe lung mass in addition to massive mediastinal lymphadenopathy and metastatic bone disease diagnosed in October 2017  PRIOR THERAPY: None  CURRENT THERAPY:  1) Systemic chemotherapy with carboplatin for AUC of 5 on day 1 and etoposide 100 MG/M2 on days 1, 2 and 3 with Neulasta support. First dose 03/23/2016. 2) palliative radiotherapy to the large mediastinal mass under the care of Dr. Tammi Klippel.  INTERVAL HISTORY: [name] 65 y.o. female returns to the clinic today for follow-up visit accompanied by her son and her interpreter. The patient was started on systemic chemotherapy with carboplatin and etoposide status post 1 cycle. She tolerated the first week of her treatment fairly well with no significant adverse effects. She is feeling much better. She denied having any significant fever or chills. She has no nausea or vomiting. She denied having any significant chest pain but continues to have shortness of breath with exertion with mild cough and no hemoptysis. She lost a few pounds recently. She she had a PET scan as well as MRI of the brain performed recently and she is here today for evaluation and discussion of her recent scans results.  MEDICAL HISTORY: Past Medical History:  Diagnosis Date  . Anemia    in the past  . Arthritis   . Depression   . Headache   . Hyperlipidemia   . Hypertension   . Lung mass 03/12/2016  . PONV (postoperative nausea and vomiting)    after hysterectomy  . Shortness of breath dyspnea    with exertion    ALLERGIES:  is allergic to penicillins.  MEDICATIONS:  Current Outpatient Prescriptions  Medication Sig Dispense Refill  . atorvastatin (LIPITOR) 40 MG tablet Take 40 mg by mouth at bedtime.    0  . benzonatate (TESSALON) 100 MG capsule Take 1-2 capsules (100-200 mg total) by mouth 3 (three) times daily as needed for cough. (Patient not taking: Reported on 03/26/2016) 40 capsule 0  . hydrochlorothiazide (HYDRODIURIL) 25 MG tablet TAKE 1 TABLET BY MOUTH DAILY (Patient taking differently: Take 25 mg by mouth daily) 90 tablet 1  . prochlorperazine (COMPAZINE) 10 MG tablet Take 1 tablet (10 mg total) by mouth every 6 (six) hours as needed for nausea or vomiting. 30 tablet 0  . temazepam (RESTORIL) 30 MG capsule Take 1 capsule (30 mg total) by mouth at bedtime as needed for sleep. 30 capsule 0  . Wound Dressings (SONAFINE) Apply 1 application topically 2 (two) times daily.     No current facility-administered medications for this visit.     SURGICAL HISTORY:  Past Surgical History:  Procedure Laterality Date  . ABDOMINAL HYSTERECTOMY     2009 , due to fobroids, benign path for uterus, tumor and also cervix.   [facility] BACK SURGERY     lower back  . COLONOSCOPY    . VIDEO BRONCHOSCOPY WITH ENDOBRONCHIAL ULTRASOUND N/A 03/15/2016   Procedure: VIDEO BRONCHOSCOPY WITH ENDOBRONCHIAL ULTRASOUND AND BIOPSY OF LEVEL SEVEN LYMPH NODE;  Surgeon: Grace Isaac, MD;  Location: [facility];  Service: Thoracic;  Laterality: N/A;    REVIEW OF SYSTEMS:  Constitutional: positive for fatigue Eyes: negative Ears, nose, mouth, throat, and face: positive for hoarseness Respiratory: positive for cough, dyspnea on exertion and pleurisy/chest pain  Cardiovascular: negative Gastrointestinal: negative Genitourinary:negative Integument/breast: negative Hematologic/lymphatic: negative Musculoskeletal:negative Neurological: positive for headaches Behavioral/Psych: negative Endocrine: negative Allergic/Immunologic: negative   PHYSICAL EXAMINATION: General appearance: alert, cooperative, fatigued and no distress Head: Normocephalic, without obvious abnormality, atraumatic Neck: no adenopathy, no JVD, supple,  symmetrical, trachea midline and thyroid not enlarged, symmetric, no tenderness/mass/nodules Lymph nodes: Cervical, supraclavicular, and axillary nodes normal. Resp: wheezes bilaterally Back: symmetric, no curvature. ROM normal. No CVA tenderness. Cardio: regular rate and rhythm, S1, S2 normal, no murmur, click, rub or gallop GI: soft, non-tender; bowel sounds normal; no masses,  no organomegaly Extremities: extremities normal, atraumatic, no cyanosis or edema Neurologic: Alert and oriented X 3, normal strength and tone. Normal symmetric reflexes. Normal coordination and gait  ECOG PERFORMANCE STATUS: 1 - Symptomatic but completely ambulatory  Blood pressure 138/81, pulse (!) 111, temperature 98.1 F (36.7 C), temperature source Oral, resp. rate 17, height '5\' 2"'$  (1.575 m), weight 146 lb 4.8 oz (66.4 kg), SpO2 100 %.  LABORATORY DATA: Lab Results  Component Value Date   WBC 8.3 03/23/2016   HGB 13.0 03/23/2016   HCT 38.1 03/23/2016   MCV 85.6 03/23/2016   PLT 216 03/23/2016      Chemistry      Component Value Date/Time   NA 136 03/23/2016 0919   K 3.4 (L) 03/23/2016 0919   CL 98 (L) 03/15/2016 1325   CO2 27 03/23/2016 0919   BUN 9.2 03/23/2016 0919   CREATININE 0.7 03/23/2016 0919      Component Value Date/Time   CALCIUM 9.7 03/23/2016 0919   ALKPHOS 66 03/23/2016 0919   AST 22 03/23/2016 0919   ALT 29 03/23/2016 0919   BILITOT <0.30 03/23/2016 0919       RADIOGRAPHIC STUDIES: Dg Chest 2 View  Result Date: 03/15/2016 CLINICAL DATA:  Preoperative evaluation for lung biopsy. EXAM: CHEST  2 VIEW COMPARISON:  03/10/2016 FINDINGS: Large anterior and middle mediastinal mass remains evident, similar to the recent previous exams. Right upper lobe parenchymal mass seen at CT best discernible on the lateral view. Heart size is normal. There is aortic atherosclerosis. The pulmonary vascularity is normal. Peripheral lung parenchyma is clear. No effusions. No acute bone finding.  IMPRESSION: Re- demonstration of large anterior and middle mediastinal mass. No change radiographically. Previously demonstrated right upper lobe parenchymal lesion visible on the lateral view. Electronically Signed   By: Nelson Chimes M.D.   On: 03/15/2016 13:57   Dg Chest 2 View  Addendum Date: 03/10/2016   ADDENDUM REPORT: 03/10/2016 17:10 ADDENDUM: The original report was by Dr. Van Clines. The following addendum is by Dr. Van Clines: These results were called by telephone at the time of interpretation on 03/10/2016 at 5:02 pm to Dr. Tenna Delaine , who verbally acknowledged these results. Electronically Signed   By: Van Clines M.D.   On: 03/10/2016 17:10   Result Date: 03/10/2016 CLINICAL DATA:  Nonproductive cough.  Difficulty swallowing. EXAM: CHEST  2 VIEW COMPARISON:  02/20/2012 FINDINGS: Abnormal right lower paratracheal stripe, thickened the within abnormal right lateral convex margin. Abnormal increased distance between the trachea and the visible aortic arch contour. Atherosclerotic calcification of the aortic arch. Heart size normal. No bony abnormality. Airway thickening is present, suggesting bronchitis or reactive airways disease. IMPRESSION: 1. Masslike density along the anterior aortic arch. Tumor/malignancy, adenopathy, and aortic arch aneurysm are possibilities. I recommend urgent CT angiography of the chest using aortic dissection protocol to assess for tumor and/or saccular aneurysm. 2. Airway thickening is present,  suggesting bronchitis or reactive airways disease. Radiology assistant personnel have been notified to put me in telephone contact with the referring physician or the referring physician's clinical representative in order to discuss these findings. Once this communication is established I will issue an addendum to this report for documentation purposes. Electronically Signed: By: Van Clines M.D. On: 03/10/2016 16:52   Ct Angio Chest W/cm &/or  Wo Cm  Result Date: 03/10/2016 CLINICAL DATA:  persistent cough, sob, abnormal CXR: "Masslike density along the anterior aortic arch.Tumor/malignancy, adenopathy, and aortic arch aneurysm arepossibilities. I recommend urgent CT angiography of the chest usingaortic dissection protocol to assess for tumor and/or saccularAneurysm." EXAM: CT ANGIOGRAPHY CHEST WITH CONTRAST TECHNIQUE: Multidetector CT imaging of the chest was performed using the standard protocol during bolus administration of intravenous contrast. Multiplanar CT image reconstructions and MIPs were obtained to evaluate the vascular anatomy. CONTRAST:  100 mL of Isovue 370 intravenous contrast COMPARISON:  Current chest radiograph. FINDINGS: Cardiovascular: No aortic aneurysm or dissection. There is mild partly calcified plaque along the aortic arch and at the origin of the innominate and left subclavian arteries. Heart is normal in size. Mild coronary artery calcifications. Minimal pericardial effusion. There is narrowing of the pulmonary arteries from a large mediastinal mass described below. Mediastinum/Nodes: There is a large mass in the mediastinum. Mass is centered at the level of the AP window just above the [place]. It measures approximately 7.5 x 7.4 x 9.3 cm. The mass surrounds the pulmonary vasculature and contacts a significant portion of the ascending aorta and aortic arch, surrounding the aortic arch branch vessels. The mass contacts and narrows the right left pulmonary arteries, greater involving the left. Mass extends into the right hilum and is contiguous with a right upper lobe nodule. The right upper lobe nodule measures 2.9 x 1.7 x 2.7 cm. Lungs/Pleura: Right upper lobe nodule described above measuring 2.9 cm in greatest dimension. No other lung nodules. No evidence of pneumonia or edema. There is mild pleural parenchymal scarring at the apices. No pleural effusion or pneumothorax. Upper Abdomen: Low-density right adrenal mass  measuring 3.7 x 2.8 x 3.4 cm. Mild thickening of the left adrenal gland. No liver mass or lesion on the included field of view. Musculoskeletal: No osteoblastic or osteolytic lesions. Review of the MIP images confirms the above findings. IMPRESSION: 1. The enlargement noted on the current chest radiograph is due to a large mass, which extends throughout much of the mediastinum encasing the pulmonary arteries and portions of the thoracic aorta and aortic arch branch vessels. The mass encases and narrows the main pulmonary arteries, left greater than right. Mass is contiguous with a large nodule, borderline mass, in the right upper lobe, measuring 2.9 cm in greatest dimension. Findings are highly suspicious for lung carcinoma with metastatic mediastinal confluent adenopathy. 2. No other lung nodules. No evidence of pneumonia or pulmonary edema. Electronically Signed   By: Lajean Manes M.D.   On: 03/10/2016 20:45   Mr Jeri Cos RS Contrast  Result Date: 03/26/2016 CLINICAL DATA:  New diagnosis lung cancer.  Initial staging. EXAM: MRI HEAD WITHOUT AND WITH CONTRAST TECHNIQUE: Multiplanar, multiecho pulse sequences of the brain and surrounding structures were obtained without and with intravenous contrast. CONTRAST:  54m MULTIHANCE GADOBENATE DIMEGLUMINE 529 MG/ML IV SOLN COMPARISON:  CT 02/26/2010 FINDINGS: Brain: The brain does not show accelerated atrophy. There is no evidence of old or acute small or large vessel infarction. No mass lesion, hemorrhage, hydrocephalus or extra-axial collection. Calcification affects  the basal ganglia. After contrast administration, no abnormal enhancement occurs. Vascular: Major vessels at the base of the brain show flow. Skull and upper cervical spine: Likely metastatic lesions affecting the right side of the C1 vertebral body, the dens and the spinous process of C2 in the vertebral body of C3. Sinuses/Orbits: Clear/normal Other: None significant IMPRESSION: No metastatic disease  to the brain. Metastatic disease affecting the right side of the C1 vertebral body, the dens, the spinous process of C2 and the C3 vertebral body. Electronically Signed   By: Nelson Chimes M.D.   On: 03/26/2016 14:24   Nm Pet Image Initial (pi) Skull Base To Thigh  Result Date: 03/26/2016 CLINICAL DATA:  Initial treatment strategy for right upper lobe lung mass. EXAM: NUCLEAR MEDICINE PET SKULL BASE TO THIGH TECHNIQUE: 7.4 mCi F-18 FDG was injected intravenously. Full-ring PET imaging was performed from the skull base to thigh after the radiotracer. CT data was obtained and used for attenuation correction and anatomic localization. FASTING BLOOD GLUCOSE:  Value: 89 mg/dl COMPARISON:  03/10/2016 chest CT. FINDINGS: NECK There is asymmetric hypermetabolism at the junction of the right hypopharynx and right cervical esophagus with max SUV 11.4, without CT correlate. There is hypermetabolic right level 4 neck (supraclavicular) lymphadenopathy, with a representative 1.4 cm right level 4 neck node with max SUV 11.6 (series 4/image 40). CHEST Central right upper lobe 1.8 x 1.1 cm hypermetabolic pulmonary nodule with max SUV 7.5 (series 8/image 31). Hypermetabolic enlarged 1.7 cm right hilar node with max SUV 11.0 (series 4/image 67). Bulky infiltrative hypermetabolic mediastinal adenopathy centered in the right paratracheal region with direct extension to the prevascular common left paratracheal, AP window and subcarinal regions, measuring 7.8 x 7.8 cm in maximum axial dimensions with max SUV 19.1 (series 4/image 58). Infiltrative hypermetabolic high left mediastinal adenopathy centered between the innominate and left common carotid arteries with maximum axial dimensions 4.3 x 3.5 cm and max SUV 12.4 (series 4/image 52). No hypermetabolic axillary or left hilar lymph nodes. No pleural effusions. Small pericardial effusion/thickening is stable from 03/10/2016. Left anterior descending coronary atherosclerosis.  Atherosclerotic nonaneurysmal thoracic aorta. No acute consolidative airspace disease or additional significant pulmonary nodules. Subpleural reticulonodular opacities at both lung apices are favored represent pleural-parenchymal scarring. ABDOMEN/PELVIS No abnormal hypermetabolic activity within the liver, pancreas, adrenal glands, or spleen. Non hypermetabolic 3.8 cm right adrenal adenoma. No hypermetabolic lymph nodes in the abdomen or pelvis. Atherosclerotic nonaneurysmal abdominal aorta. SKELETON Innumerable faintly sclerotic hypermetabolic osseous lesions throughout the axial and proximal appendicular skeleton involving the bilateral shoulder girdles, bilateral ribs, thoracolumbar spine, sacrum and bilateral pelvic girdle. Representative faintly sclerotic medial right iliac bone lesion with max SUV 12.3. Representative left upper sacral faintly sclerotic lesion with max SUV 9.8. Representative left femoral head faintly sclerotic lesion with max SUV 6.8. Represent L2 vertebral faintly sclerotic lesion with max SUV 8.6. IMPRESSION: 1. Hypermetabolic 1.8 cm central right upper lobe pulmonary nodule, consistent with a primary bronchogenic carcinoma. 2. Hypermetabolic right hilar, bulky infiltrative bilateral mediastinal and right supraclavicular nodal metastases. 3. Innumerable faintly sclerotic hypermetabolic osseous metastases throughout the axial and proximal appendicular skeleton. 4. Asymmetric hypermetabolism at the junction of the right hypopharynx and right cervical esophagus, suspicious for metastasis, without discrete CT correlate. 5. Additional findings include aortic atherosclerosis, 1 vessel coronary atherosclerosis and right adrenal adenoma. Electronically Signed   By: Ilona Sorrel M.D.   On: 03/26/2016 14:38    ASSESSMENT AND PLAN: This is a very pleasant 65 years old white  female recently diagnosed with extensive stage small cell lung cancer presented with large central lung mass involving the  mediastinum and encasing the pulmonary arteries and portion of the thoracic aorta and aortic arch branches with narrowing of the main pulmonary arteries. There was also right upper lobe lung mass as well as metastatic bone disease. The recent MRI of the brain showed no evidence of metastatic disease to the brain. PET scan showed the large central lung mass as well as mediastinal lymphadenopathy and metastatic bone disease. I discussed the scan results with the patient and her son. The patient is currently undergoing systemic chemotherapy with carboplatin and etoposide status post 1 cycle. She tolerated the first week of her treatment well with no significant adverse effects. I recommended for the patient to continue with weekly blood work as a scheduled. I will see her back for follow-up visit in 2 weeks for evaluation before starting cycle #2. She was advised to call immediately if she has any concerning symptoms in the interval. The patient voices understanding of current disease status and treatment options and is in agreement with the current care plan.  All questions were answered. The patient knows to call the clinic with any problems, questions or concerns. We can certainly see the patient much sooner if necessary.  Disclaimer: This note was dictated with voice recognition software. Similar sounding words can inadvertently be transcribed and may not be corrected upon review.

## 2016-03-31 ENCOUNTER — Telehealth: Payer: Self-pay

## 2016-03-31 ENCOUNTER — Encounter: Payer: Self-pay | Admitting: Radiation Oncology

## 2016-03-31 ENCOUNTER — Telehealth: Payer: Self-pay | Admitting: Internal Medicine

## 2016-03-31 ENCOUNTER — Ambulatory Visit
Admission: RE | Admit: 2016-03-31 | Discharge: 2016-03-31 | Disposition: A | Discharge status: Home / Self Care | Payer: No Typology Code available for payment source | Source: Ambulatory Visit | Attending: Radiation Oncology | Admitting: Radiation Oncology

## 2016-03-31 DIAGNOSIS — C3491 Malignant neoplasm of unspecified part of right bronchus or lung: Secondary | ICD-10-CM | POA: Diagnosis not present

## 2016-03-31 NOTE — Telephone Encounter (Signed)
Faxed FMLA paperwork to [employer]

## 2016-03-31 NOTE — Telephone Encounter (Signed)
Faxed disability form to [employer]

## 2016-03-31 NOTE — Telephone Encounter (Signed)
Patient stopped by for scheduled today. Appointments completed per 10/17 los. Gave patient appointments for October thru January.

## 2016-04-01 ENCOUNTER — Telehealth: Payer: Self-pay

## 2016-04-01 ENCOUNTER — Encounter: Payer: Self-pay | Admitting: Radiation Oncology

## 2016-04-01 ENCOUNTER — Ambulatory Visit: Admission: RE | Admit: 2016-04-01 | Payer: No Typology Code available for payment source | Source: Ambulatory Visit

## 2016-04-01 NOTE — Telephone Encounter (Signed)
FMLA paperwork mailed to [employer]

## 2016-04-01 NOTE — Progress Notes (Signed)
Paperwork completed and faxed, conf rec, copy mailed to patient

## 2016-04-02 ENCOUNTER — Ambulatory Visit: Payer: No Typology Code available for payment source

## 2016-04-05 ENCOUNTER — Ambulatory Visit: Payer: No Typology Code available for payment source

## 2016-04-06 ENCOUNTER — Other Ambulatory Visit (HOSPITAL_BASED_OUTPATIENT_CLINIC_OR_DEPARTMENT_OTHER): Payer: Self-pay

## 2016-04-06 ENCOUNTER — Ambulatory Visit: Payer: No Typology Code available for payment source

## 2016-04-06 DIAGNOSIS — C3491 Malignant neoplasm of unspecified part of right bronchus or lung: Secondary | ICD-10-CM

## 2016-04-06 DIAGNOSIS — C3411 Malignant neoplasm of upper lobe, right bronchus or lung: Secondary | ICD-10-CM

## 2016-04-06 LAB — COMPREHENSIVE METABOLIC PANEL
ALBUMIN: 3.5 g/dL (ref 3.5–5.0)
ALK PHOS: 128 U/L (ref 40–150)
ALT: 32 U/L (ref 0–55)
AST: 20 U/L (ref 5–34)
Anion Gap: 10 mEq/L (ref 3–11)
BUN: 11.7 mg/dL (ref 7.0–26.0)
CHLORIDE: 94 meq/L — AB (ref 98–109)
CO2: 32 meq/L — AB (ref 22–29)
Calcium: 9.6 mg/dL (ref 8.4–10.4)
Creatinine: 0.7 mg/dL (ref 0.6–1.1)
GLUCOSE: 105 mg/dL (ref 70–140)
POTASSIUM: 3.7 meq/L (ref 3.5–5.1)
SODIUM: 136 meq/L (ref 136–145)
Total Bilirubin: 0.23 mg/dL (ref 0.20–1.20)
Total Protein: 7.1 g/dL (ref 6.4–8.3)

## 2016-04-06 LAB — CBC WITH DIFFERENTIAL/PLATELET
BASO%: 0.5 % (ref 0.0–2.0)
BASOS ABS: 0.1 10*3/uL (ref 0.0–0.1)
EOS ABS: 0 10*3/uL (ref 0.0–0.5)
EOS%: 0.2 % (ref 0.0–7.0)
HCT: 38.7 % (ref 34.8–46.6)
HEMOGLOBIN: 12.8 g/dL (ref 11.6–15.9)
LYMPH%: 12.4 % — AB (ref 14.0–49.7)
MCH: 28.5 pg (ref 25.1–34.0)
MCHC: 33 g/dL (ref 31.5–36.0)
MCV: 86.4 fL (ref 79.5–101.0)
MONO#: 1 10*3/uL — ABNORMAL HIGH (ref 0.1–0.9)
MONO%: 7.9 % (ref 0.0–14.0)
NEUT#: 10.2 10*3/uL — ABNORMAL HIGH (ref 1.5–6.5)
NEUT%: 79 % — ABNORMAL HIGH (ref 38.4–76.8)
Platelets: 112 10*3/uL — ABNORMAL LOW (ref 145–400)
RBC: 4.48 10*6/uL (ref 3.70–5.45)
RDW: 13.2 % (ref 11.2–14.5)
WBC: 12.9 10*3/uL — ABNORMAL HIGH (ref 3.9–10.3)
lymph#: 1.6 10*3/uL (ref 0.9–3.3)

## 2016-04-07 ENCOUNTER — Ambulatory Visit: Payer: No Typology Code available for payment source

## 2016-04-08 ENCOUNTER — Ambulatory Visit: Payer: No Typology Code available for payment source

## 2016-04-09 ENCOUNTER — Ambulatory Visit: Payer: No Typology Code available for payment source

## 2016-04-11 NOTE — Progress Notes (Signed)
  Radiation Oncology         [phone] ________________________________  Name: Evelene Roussin MRN: [id number]  Date: 03/31/2016  DOB: Nov 10, 1950  End of Treatment Note  Diagnosis:   65 y.o. female at high risk for SVC syndrome in the setting of extensive stage small cell carcinoma of the right upper lobe and involving the mediastinal lymph nodes     Indication for treatment:  Palliation       Radiation treatment dates:   03/18/16-03/31/16  Site/dose:   The chest was treated to 30 Gy in 10 fractions of 3 Gy  Beams/energy:   15 MV X-rays were used with 3D planning  Narrative: The patient tolerated radiation treatment relatively well.    Patient reports occasional cough in Am, with mucous a brackish color, no nausea, shortness breath with exertion, appetite good,  Occasionally food gets stuck. Patient is fatigued. Patient denies feeling any pain but reports weakness in her arms.   Plan: The patient has completed radiation treatment. The patient will return to radiation oncology clinic for routine followup in one month. I advised her to call or return sooner if she has any questions or concerns related to her recovery or treatment. ________________________________  Sheral Apley. Tammi Klippel, M.D.

## 2016-04-12 ENCOUNTER — Ambulatory Visit: Payer: No Typology Code available for payment source

## 2016-04-13 ENCOUNTER — Ambulatory Visit: Payer: No Typology Code available for payment source

## 2016-04-13 ENCOUNTER — Ambulatory Visit (HOSPITAL_BASED_OUTPATIENT_CLINIC_OR_DEPARTMENT_OTHER): Payer: No Typology Code available for payment source

## 2016-04-13 ENCOUNTER — Telehealth: Payer: Self-pay | Admitting: Internal Medicine

## 2016-04-13 ENCOUNTER — Ambulatory Visit (HOSPITAL_BASED_OUTPATIENT_CLINIC_OR_DEPARTMENT_OTHER): Payer: No Typology Code available for payment source | Admitting: Internal Medicine

## 2016-04-13 ENCOUNTER — Other Ambulatory Visit: Payer: Self-pay | Admitting: Family Medicine

## 2016-04-13 ENCOUNTER — Other Ambulatory Visit (HOSPITAL_BASED_OUTPATIENT_CLINIC_OR_DEPARTMENT_OTHER): Payer: No Typology Code available for payment source

## 2016-04-13 ENCOUNTER — Encounter: Payer: Self-pay | Admitting: Internal Medicine

## 2016-04-13 ENCOUNTER — Other Ambulatory Visit: Payer: No Typology Code available for payment source

## 2016-04-13 VITALS — BP 120/72 | HR 101 | Temp 98.3°F | Resp 16 | Ht 62.0 in | Wt 147.2 lb

## 2016-04-13 DIAGNOSIS — C3491 Malignant neoplasm of unspecified part of right bronchus or lung: Secondary | ICD-10-CM

## 2016-04-13 DIAGNOSIS — Z5111 Encounter for antineoplastic chemotherapy: Secondary | ICD-10-CM

## 2016-04-13 DIAGNOSIS — C7951 Secondary malignant neoplasm of bone: Secondary | ICD-10-CM

## 2016-04-13 DIAGNOSIS — C3411 Malignant neoplasm of upper lobe, right bronchus or lung: Secondary | ICD-10-CM

## 2016-04-13 LAB — CBC WITH DIFFERENTIAL/PLATELET
BASO%: 0.1 % (ref 0.0–2.0)
BASOS ABS: 0 10*3/uL (ref 0.0–0.1)
EOS ABS: 0 10*3/uL (ref 0.0–0.5)
EOS%: 0.2 % (ref 0.0–7.0)
HEMATOCRIT: 34.8 % (ref 34.8–46.6)
HEMOGLOBIN: 12.1 g/dL (ref 11.6–15.9)
LYMPH#: 1.3 10*3/uL (ref 0.9–3.3)
LYMPH%: 12.6 % — ABNORMAL LOW (ref 14.0–49.7)
MCH: 29.6 pg (ref 25.1–34.0)
MCHC: 34.8 g/dL (ref 31.5–36.0)
MCV: 85.1 fL (ref 79.5–101.0)
MONO#: 0.9 10*3/uL (ref 0.1–0.9)
MONO%: 8.8 % (ref 0.0–14.0)
NEUT#: 8.3 10*3/uL — ABNORMAL HIGH (ref 1.5–6.5)
NEUT%: 78.3 % — ABNORMAL HIGH (ref 38.4–76.8)
Platelets: 315 10*3/uL (ref 145–400)
RBC: 4.09 10*6/uL (ref 3.70–5.45)
RDW: 13.8 % (ref 11.2–14.5)
WBC: 10.6 10*3/uL — ABNORMAL HIGH (ref 3.9–10.3)

## 2016-04-13 LAB — COMPREHENSIVE METABOLIC PANEL
ALBUMIN: 3.4 g/dL — AB (ref 3.5–5.0)
ALK PHOS: 125 U/L (ref 40–150)
ALT: 22 U/L (ref 0–55)
AST: 15 U/L (ref 5–34)
Anion Gap: 11 mEq/L (ref 3–11)
BUN: 8.1 mg/dL (ref 7.0–26.0)
CALCIUM: 9.5 mg/dL (ref 8.4–10.4)
CO2: 28 mEq/L (ref 22–29)
Chloride: 99 mEq/L (ref 98–109)
Creatinine: 0.6 mg/dL (ref 0.6–1.1)
Glucose: 105 mg/dl (ref 70–140)
POTASSIUM: 3.2 meq/L — AB (ref 3.5–5.1)
Sodium: 138 mEq/L (ref 136–145)
Total Bilirubin: <0.22 mg/dL (ref 0.20–1.20)
Total Protein: 7 g/dL (ref 6.4–8.3)

## 2016-04-13 MED ORDER — PALONOSETRON HCL INJECTION 0.25 MG/5ML
0.2500 mg | Freq: Once | INTRAVENOUS | Status: AC
  Administered 2016-04-13: 0.25 mg via INTRAVENOUS

## 2016-04-13 MED ORDER — PALONOSETRON HCL INJECTION 0.25 MG/5ML
INTRAVENOUS | Status: AC
  Filled 2016-04-13: qty 5

## 2016-04-13 MED ORDER — BENZONATATE 100 MG PO CAPS: 100.0000 mg | ORAL_CAPSULE | Freq: Three times a day (TID) | ORAL | 0 refills | Status: DC | PRN

## 2016-04-13 MED ORDER — SODIUM CHLORIDE 0.9 % IV SOLN
500.0000 mg | Freq: Once | INTRAVENOUS | Status: AC
  Administered 2016-04-13: 500 mg via INTRAVENOUS
  Filled 2016-04-13: qty 50

## 2016-04-13 MED ORDER — DEXAMETHASONE SODIUM PHOSPHATE 10 MG/ML IJ SOLN
INTRAMUSCULAR | Status: AC
  Filled 2016-04-13: qty 1

## 2016-04-13 MED ORDER — TEMAZEPAM 30 MG PO CAPS: 30.0000 mg | ORAL_CAPSULE | Freq: Every evening | ORAL | 0 refills | Status: DC | PRN

## 2016-04-13 MED ORDER — SODIUM CHLORIDE 0.9 % IV SOLN
Freq: Once | INTRAVENOUS | Status: AC
  Administered 2016-04-13: 13:00:00 via INTRAVENOUS

## 2016-04-13 MED ORDER — SODIUM CHLORIDE 0.9 % IV SOLN
100.0000 mg/m2 | Freq: Once | INTRAVENOUS | Status: AC
  Administered 2016-04-13: 170 mg via INTRAVENOUS
  Filled 2016-04-13: qty 8.5

## 2016-04-13 MED ORDER — DEXAMETHASONE SODIUM PHOSPHATE 10 MG/ML IJ SOLN
10.0000 mg | Freq: Once | INTRAMUSCULAR | Status: AC
  Administered 2016-04-13: 10 mg via INTRAVENOUS

## 2016-04-13 NOTE — Progress Notes (Signed)
[place] [phone]   [phone]  OFFICE PROGRESS NOTE  Madeline Douglas, PA-C [place] [address]  DIAGNOSIS: Extensive stage (T1b, N3, M1b) small cell lung cancer presented with right upper lobe lung mass in addition to massive mediastinal lymphadenopathy and metastatic bone disease diagnosed in October 2017  PRIOR THERAPY:  Palliative radiotherapy to the large mediastinal mass under the care of Dr. Tammi Klippel.  CURRENT THERAPY:  1) Systemic chemotherapy with carboplatin for AUC of 5 on day 1 and etoposide 100 MG/M2 on days 1, 2 and 3 with Neulasta support. First dose 03/23/2016. Status post 1 cycle.   INTERVAL HISTORY: [name] 65 y.o. female returns to the clinic today for follow-up visit accompanied by her son and her interpreter. The patient is feeling fine today with no specific complaints except for mild fatigue. She tolerated the first cycle of her systemic chemotherapy with carboplatin and etoposide fairly well. She continues to have hoarseness of her voice as well as sore throat secondary to radiation treatment. She denied having any weight loss or night sweats. She has no nausea or vomiting. She has no fever or chills. The patient denied having any significant chest pain but continues to have shortness breath with exertion with mild cough and no hemoptysis. She is here today for evaluation before starting cycle #2.  MEDICAL HISTORY: Past Medical History:  Diagnosis Date  . Anemia    in the past  . Arthritis   . Depression   . Encounter for antineoplastic chemotherapy 03/30/2016  . Headache   . Hyperlipidemia   . Hypertension   . Lung mass 03/12/2016  . PONV (postoperative nausea and vomiting)    after hysterectomy  . Shortness of breath dyspnea    with exertion    ALLERGIES:  is allergic to penicillins.  MEDICATIONS:  Current Outpatient Prescriptions  Medication Sig Dispense Refill  . atorvastatin  (LIPITOR) 40 MG tablet Take 40 mg by mouth at bedtime.   0  . hydrochlorothiazide (HYDRODIURIL) 25 MG tablet TAKE 1 TABLET BY MOUTH DAILY (Patient taking differently: Take 25 mg by mouth daily) 90 tablet 1  . temazepam (RESTORIL) 30 MG capsule Take 1 capsule (30 mg total) by mouth at bedtime as needed for sleep. 30 capsule 0  . Wound Dressings (SONAFINE) Apply 1 application topically 2 (two) times daily.    . benzonatate (TESSALON) 100 MG capsule Take 1-2 capsules (100-200 mg total) by mouth 3 (three) times daily as needed for cough. (Patient not taking: Reported on 04/13/2016) 40 capsule 0  . prochlorperazine (COMPAZINE) 10 MG tablet Take 1 tablet (10 mg total) by mouth every 6 (six) hours as needed for nausea or vomiting. (Patient not taking: Reported on 04/13/2016) 30 tablet 0   No current facility-administered medications for this visit.     SURGICAL HISTORY:  Past Surgical History:  Procedure Laterality Date  . ABDOMINAL HYSTERECTOMY     2009 , due to fobroids, benign path for uterus, tumor and also cervix.   [facility] BACK SURGERY     lower back  . COLONOSCOPY    . VIDEO BRONCHOSCOPY WITH ENDOBRONCHIAL ULTRASOUND N/A 03/15/2016   Procedure: VIDEO BRONCHOSCOPY WITH ENDOBRONCHIAL ULTRASOUND AND BIOPSY OF LEVEL SEVEN LYMPH NODE;  Surgeon: Grace Isaac, MD;  Location: [facility];  Service: Thoracic;  Laterality: N/A;    REVIEW OF SYSTEMS:  A comprehensive review of systems was negative except for: Constitutional: positive for fatigue Ears, nose,  mouth, throat, and face: positive for hoarseness Respiratory: positive for cough and dyspnea on exertion Behavioral/Psych: positive for sleep disturbance   PHYSICAL EXAMINATION: General appearance: alert, cooperative, fatigued and no distress Head: Normocephalic, without obvious abnormality, atraumatic Neck: no adenopathy, no JVD, supple, symmetrical, trachea midline and thyroid not enlarged, symmetric, no tenderness/mass/nodules Lymph nodes: Cervical,  supraclavicular, and axillary nodes normal. Resp: wheezes bilaterally Back: symmetric, no curvature. ROM normal. No CVA tenderness. Cardio: regular rate and rhythm, S1, S2 normal, no murmur, click, rub or gallop GI: soft, non-tender; bowel sounds normal; no masses,  no organomegaly Extremities: extremities normal, atraumatic, no cyanosis or edema Neurologic: Alert and oriented X 3, normal strength and tone. Normal symmetric reflexes. Normal coordination and gait  ECOG PERFORMANCE STATUS: 1 - Symptomatic but completely ambulatory  Blood pressure 120/72, pulse (!) 101, temperature 98.3 F (36.8 C), temperature source Oral, resp. rate 16, height '5\' 2"'$  (1.575 m), weight 147 lb 3.2 oz (66.8 kg), SpO2 97 %.  LABORATORY DATA: Lab Results  Component Value Date   WBC 10.6 (H) 04/13/2016   HGB 12.1 04/13/2016   HCT 34.8 04/13/2016   MCV 85.1 04/13/2016   PLT 315 04/13/2016      Chemistry      Component Value Date/Time   NA 136 04/06/2016 1312   K 3.7 04/06/2016 1312   CL 98 (L) 03/15/2016 1325   CO2 32 (H) 04/06/2016 1312   BUN 11.7 04/06/2016 1312   CREATININE 0.7 04/06/2016 1312      Component Value Date/Time   CALCIUM 9.6 04/06/2016 1312   ALKPHOS 128 04/06/2016 1312   AST 20 04/06/2016 1312   ALT 32 04/06/2016 1312   BILITOT 0.23 04/06/2016 1312       RADIOGRAPHIC STUDIES: Dg Chest 2 View  Result Date: 03/15/2016 CLINICAL DATA:  Preoperative evaluation for lung biopsy. EXAM: CHEST  2 VIEW COMPARISON:  03/10/2016 FINDINGS: Large anterior and middle mediastinal mass remains evident, similar to the recent previous exams. Right upper lobe parenchymal mass seen at CT best discernible on the lateral view. Heart size is normal. There is aortic atherosclerosis. The pulmonary vascularity is normal. Peripheral lung parenchyma is clear. No effusions. No acute bone finding. IMPRESSION: Re- demonstration of large anterior and middle mediastinal mass. No change radiographically. Previously  demonstrated right upper lobe parenchymal lesion visible on the lateral view. Electronically Signed   By: Nelson Chimes M.D.   On: 03/15/2016 13:57   Mr Jeri Cos ER Contrast  Result Date: 03/26/2016 CLINICAL DATA:  New diagnosis lung cancer.  Initial staging. EXAM: MRI HEAD WITHOUT AND WITH CONTRAST TECHNIQUE: Multiplanar, multiecho pulse sequences of the brain and surrounding structures were obtained without and with intravenous contrast. CONTRAST:  58m MULTIHANCE GADOBENATE DIMEGLUMINE 529 MG/ML IV SOLN COMPARISON:  CT 02/26/2010 FINDINGS: Brain: The brain does not show accelerated atrophy. There is no evidence of old or acute small or large vessel infarction. No mass lesion, hemorrhage, hydrocephalus or extra-axial collection. Calcification affects the basal ganglia. After contrast administration, no abnormal enhancement occurs. Vascular: Major vessels at the base of the brain show flow. Skull and upper cervical spine: Likely metastatic lesions affecting the right side of the C1 vertebral body, the dens and the spinous process of C2 in the vertebral body of C3. Sinuses/Orbits: Clear/normal Other: None significant IMPRESSION: No metastatic disease to the brain. Metastatic disease affecting the right side of the C1 vertebral body, the dens, the spinous process of C2 and the C3 vertebral body. Electronically Signed  By: Nelson Chimes M.D.   On: 03/26/2016 14:24   Nm Pet Image Initial (pi) Skull Base To Thigh  Result Date: 03/26/2016 CLINICAL DATA:  Initial treatment strategy for right upper lobe lung mass. EXAM: NUCLEAR MEDICINE PET SKULL BASE TO THIGH TECHNIQUE: 7.4 mCi F-18 FDG was injected intravenously. Full-ring PET imaging was performed from the skull base to thigh after the radiotracer. CT data was obtained and used for attenuation correction and anatomic localization. FASTING BLOOD GLUCOSE:  Value: 89 mg/dl COMPARISON:  03/10/2016 chest CT. FINDINGS: NECK There is asymmetric hypermetabolism at the  junction of the right hypopharynx and right cervical esophagus with max SUV 11.4, without CT correlate. There is hypermetabolic right level 4 neck (supraclavicular) lymphadenopathy, with a representative 1.4 cm right level 4 neck node with max SUV 11.6 (series 4/image 40). CHEST Central right upper lobe 1.8 x 1.1 cm hypermetabolic pulmonary nodule with max SUV 7.5 (series 8/image 31). Hypermetabolic enlarged 1.7 cm right hilar node with max SUV 11.0 (series 4/image 67). Bulky infiltrative hypermetabolic mediastinal adenopathy centered in the right paratracheal region with direct extension to the prevascular common left paratracheal, AP window and subcarinal regions, measuring 7.8 x 7.8 cm in maximum axial dimensions with max SUV 19.1 (series 4/image 58). Infiltrative hypermetabolic high left mediastinal adenopathy centered between the innominate and left common carotid arteries with maximum axial dimensions 4.3 x 3.5 cm and max SUV 12.4 (series 4/image 52). No hypermetabolic axillary or left hilar lymph nodes. No pleural effusions. Small pericardial effusion/thickening is stable from 03/10/2016. Left anterior descending coronary atherosclerosis. Atherosclerotic nonaneurysmal thoracic aorta. No acute consolidative airspace disease or additional significant pulmonary nodules. Subpleural reticulonodular opacities at both lung apices are favored represent pleural-parenchymal scarring. ABDOMEN/PELVIS No abnormal hypermetabolic activity within the liver, pancreas, adrenal glands, or spleen. Non hypermetabolic 3.8 cm right adrenal adenoma. No hypermetabolic lymph nodes in the abdomen or pelvis. Atherosclerotic nonaneurysmal abdominal aorta. SKELETON Innumerable faintly sclerotic hypermetabolic osseous lesions throughout the axial and proximal appendicular skeleton involving the bilateral shoulder girdles, bilateral ribs, thoracolumbar spine, sacrum and bilateral pelvic girdle. Representative faintly sclerotic medial right  iliac bone lesion with max SUV 12.3. Representative left upper sacral faintly sclerotic lesion with max SUV 9.8. Representative left femoral head faintly sclerotic lesion with max SUV 6.8. Represent L2 vertebral faintly sclerotic lesion with max SUV 8.6. IMPRESSION: 1. Hypermetabolic 1.8 cm central right upper lobe pulmonary nodule, consistent with a primary bronchogenic carcinoma. 2. Hypermetabolic right hilar, bulky infiltrative bilateral mediastinal and right supraclavicular nodal metastases. 3. Innumerable faintly sclerotic hypermetabolic osseous metastases throughout the axial and proximal appendicular skeleton. 4. Asymmetric hypermetabolism at the junction of the right hypopharynx and right cervical esophagus, suspicious for metastasis, without discrete CT correlate. 5. Additional findings include aortic atherosclerosis, 1 vessel coronary atherosclerosis and right adrenal adenoma. Electronically Signed   By: Ilona Sorrel M.D.   On: 03/26/2016 14:38    ASSESSMENT AND PLAN: This is a very pleasant 65 years old white female recently diagnosed with extensive stage small cell lung cancer presented with large central lung mass involving the mediastinum and encasing the pulmonary arteries and portion of the thoracic aorta and aortic arch branches with narrowing of the main pulmonary arteries. There was also right upper lobe lung mass as well as metastatic bone disease. She received palliative radiotherapy to the large right upper lobe lung mass. The patient is currently on systemic chemotherapy with carboplatin and paclitaxel is status post 1 cycle. She tolerated the first cycle of her treatment  fairly well. I recommended for her to proceed with cycle #2 today as a scheduled. She would come back for follow-up visit in 3 weeks for evaluation and repeat CT scan of the chest, abdomen and pelvis for restaging of her disease before starting cycle #3. I gave the patient referral for Restoril and Tessalon. She was  advised to call immediately if she has any concerning symptoms in the interval. The patient voices understanding of current disease status and treatment options and is in agreement with the current care plan.  All questions were answered. The patient knows to call the clinic with any problems, questions or concerns. We can certainly see the patient much sooner if necessary.  Disclaimer: This note was dictated with voice recognition software. Similar sounding words can inadvertently be transcribed and may not be corrected upon review.

## 2016-04-13 NOTE — Patient Instructions (Signed)
[name] Discharge Instructions for Patients Receiving Chemotherapy  Today you received the following chemotherapy agents :  Carboplatin, Etoposide.  To help prevent nausea and vomiting after your treatment, we encourage you to take your nausea medication as prescribed.   If you develop nausea and vomiting that is not controlled by your nausea medication, call the clinic.   BELOW ARE SYMPTOMS THAT SHOULD BE REPORTED IMMEDIATELY:  *FEVER GREATER THAN 100.5 F  *CHILLS WITH OR WITHOUT FEVER  NAUSEA AND VOMITING THAT IS NOT CONTROLLED WITH YOUR NAUSEA MEDICATION  *UNUSUAL SHORTNESS OF BREATH  *UNUSUAL BRUISING OR BLEEDING  TENDERNESS IN MOUTH AND THROAT WITH OR WITHOUT PRESENCE OF ULCERS  *URINARY PROBLEMS  *BOWEL PROBLEMS  UNUSUAL RASH Items with * indicate a potential emergency and should be followed up as soon as possible.  Feel free to call the clinic you have any questions or concerns. The clinic phone number is [phone].  Please show the [facility] at check-in to the Emergency Department and triage nurse.

## 2016-04-13 NOTE — Telephone Encounter (Signed)
2 bottles of contrast and instructions given to patient per CT ordered. AVS report and appointment schedule given to patient, per 04/13/16 los.

## 2016-04-14 ENCOUNTER — Ambulatory Visit (HOSPITAL_BASED_OUTPATIENT_CLINIC_OR_DEPARTMENT_OTHER): Payer: No Typology Code available for payment source

## 2016-04-14 ENCOUNTER — Ambulatory Visit: Payer: No Typology Code available for payment source

## 2016-04-14 VITALS — BP 101/67 | HR 80 | Temp 98.4°F | Resp 17

## 2016-04-14 DIAGNOSIS — C7951 Secondary malignant neoplasm of bone: Secondary | ICD-10-CM

## 2016-04-14 DIAGNOSIS — Z5111 Encounter for antineoplastic chemotherapy: Secondary | ICD-10-CM | POA: Diagnosis not present

## 2016-04-14 DIAGNOSIS — C3491 Malignant neoplasm of unspecified part of right bronchus or lung: Secondary | ICD-10-CM

## 2016-04-14 DIAGNOSIS — C3411 Malignant neoplasm of upper lobe, right bronchus or lung: Secondary | ICD-10-CM

## 2016-04-14 MED ORDER — SODIUM CHLORIDE 0.9 % IV SOLN
100.0000 mg/m2 | Freq: Once | INTRAVENOUS | Status: AC
  Administered 2016-04-14: 170 mg via INTRAVENOUS
  Filled 2016-04-14: qty 8.5

## 2016-04-14 MED ORDER — DEXAMETHASONE SODIUM PHOSPHATE 10 MG/ML IJ SOLN
10.0000 mg | Freq: Once | INTRAMUSCULAR | Status: AC
  Administered 2016-04-14: 10 mg via INTRAVENOUS

## 2016-04-14 MED ORDER — SODIUM CHLORIDE 0.9 % IV SOLN
Freq: Once | INTRAVENOUS | Status: AC
  Administered 2016-04-14: 14:00:00 via INTRAVENOUS

## 2016-04-14 MED ORDER — DEXAMETHASONE SODIUM PHOSPHATE 10 MG/ML IJ SOLN
INTRAMUSCULAR | Status: AC
  Filled 2016-04-14: qty 1

## 2016-04-14 NOTE — Patient Instructions (Signed)
[facility] Discharge Instructions for Patients Receiving Chemotherapy  Today you received the following chemotherapy agents Etoposide.   To help prevent nausea and vomiting after your treatment, we encourage you to take your nausea medication as prescribed.   If you develop nausea and vomiting that is not controlled by your nausea medication, call the clinic.   BELOW ARE SYMPTOMS THAT SHOULD BE REPORTED IMMEDIATELY:  *FEVER GREATER THAN 100.5 F  *CHILLS WITH OR WITHOUT FEVER  NAUSEA AND VOMITING THAT IS NOT CONTROLLED WITH YOUR NAUSEA MEDICATION  *UNUSUAL SHORTNESS OF BREATH  *UNUSUAL BRUISING OR BLEEDING  TENDERNESS IN MOUTH AND THROAT WITH OR WITHOUT PRESENCE OF ULCERS  *URINARY PROBLEMS  *BOWEL PROBLEMS  UNUSUAL RASH Items with * indicate a potential emergency and should be followed up as soon as possible.  Feel free to call the clinic you have any questions or concerns. The clinic phone number is [phone].  Please show the CHEMO ALERT CARD at check-in to the Emergency Department and triage nurse.   

## 2016-04-15 ENCOUNTER — Ambulatory Visit (HOSPITAL_BASED_OUTPATIENT_CLINIC_OR_DEPARTMENT_OTHER): Payer: No Typology Code available for payment source

## 2016-04-15 VITALS — BP 111/63 | HR 84 | Temp 98.5°F | Resp 18

## 2016-04-15 DIAGNOSIS — C3491 Malignant neoplasm of unspecified part of right bronchus or lung: Secondary | ICD-10-CM

## 2016-04-15 DIAGNOSIS — C3411 Malignant neoplasm of upper lobe, right bronchus or lung: Secondary | ICD-10-CM

## 2016-04-15 DIAGNOSIS — Z5111 Encounter for antineoplastic chemotherapy: Secondary | ICD-10-CM

## 2016-04-15 MED ORDER — DEXAMETHASONE SODIUM PHOSPHATE 10 MG/ML IJ SOLN
10.0000 mg | Freq: Once | INTRAMUSCULAR | Status: AC
  Administered 2016-04-15: 10 mg via INTRAVENOUS

## 2016-04-15 MED ORDER — SODIUM CHLORIDE 0.9 % IV SOLN
Freq: Once | INTRAVENOUS | Status: AC
  Administered 2016-04-15: 15:00:00 via INTRAVENOUS

## 2016-04-15 MED ORDER — SODIUM CHLORIDE 0.9 % IV SOLN
100.0000 mg/m2 | Freq: Once | INTRAVENOUS | Status: AC
  Administered 2016-04-15: 170 mg via INTRAVENOUS
  Filled 2016-04-15: qty 8.5

## 2016-04-15 MED ORDER — DEXAMETHASONE SODIUM PHOSPHATE 10 MG/ML IJ SOLN
INTRAMUSCULAR | Status: AC
  Filled 2016-04-15: qty 1

## 2016-04-15 NOTE — Patient Instructions (Signed)
[facility] Discharge Instructions for Patients Receiving Chemotherapy  Today you received the following chemotherapy agents Etoposide.   To help prevent nausea and vomiting after your treatment, we encourage you to take your nausea medication as prescribed.   If you develop nausea and vomiting that is not controlled by your nausea medication, call the clinic.   BELOW ARE SYMPTOMS THAT SHOULD BE REPORTED IMMEDIATELY:  *FEVER GREATER THAN 100.5 F  *CHILLS WITH OR WITHOUT FEVER  NAUSEA AND VOMITING THAT IS NOT CONTROLLED WITH YOUR NAUSEA MEDICATION  *UNUSUAL SHORTNESS OF BREATH  *UNUSUAL BRUISING OR BLEEDING  TENDERNESS IN MOUTH AND THROAT WITH OR WITHOUT PRESENCE OF ULCERS  *URINARY PROBLEMS  *BOWEL PROBLEMS  UNUSUAL RASH Items with * indicate a potential emergency and should be followed up as soon as possible.  Feel free to call the clinic you have any questions or concerns. The clinic phone number is [phone].  Please show the CHEMO ALERT CARD at check-in to the Emergency Department and triage nurse.   

## 2016-04-16 NOTE — Telephone Encounter (Signed)
Pt called to check on this refill. It looks like it went to [facility] and not our in basket. I had April tell pt that I can RF for 1 mos but then she is due for BP and med refill check up. Done.

## 2016-04-17 ENCOUNTER — Ambulatory Visit (HOSPITAL_BASED_OUTPATIENT_CLINIC_OR_DEPARTMENT_OTHER): Payer: No Typology Code available for payment source

## 2016-04-17 VITALS — BP 131/83 | HR 109 | Temp 98.2°F | Resp 17 | Ht 62.0 in

## 2016-04-17 DIAGNOSIS — C3411 Malignant neoplasm of upper lobe, right bronchus or lung: Secondary | ICD-10-CM | POA: Diagnosis not present

## 2016-04-17 DIAGNOSIS — C3491 Malignant neoplasm of unspecified part of right bronchus or lung: Secondary | ICD-10-CM

## 2016-04-17 MED ORDER — PEGFILGRASTIM INJECTION 6 MG/0.6ML ~~LOC~~
6.0000 mg | PREFILLED_SYRINGE | Freq: Once | SUBCUTANEOUS | Status: AC
  Administered 2016-04-17: 6 mg via SUBCUTANEOUS

## 2016-04-17 NOTE — Patient Instructions (Signed)
Pegfilgrastim injection What is this medicine? PEGFILGRASTIM (PEG fil gra stim) is a long-acting granulocyte colony-stimulating factor that stimulates the growth of neutrophils, a type of white blood cell important in the body's fight against infection. It is used to reduce the incidence of fever and infection in patients with certain types of cancer who are receiving chemotherapy that affects the bone marrow, and to increase survival after being exposed to high doses of radiation. This medicine may be used for other purposes; ask your health care provider or pharmacist if you have questions. What should I tell my health care provider before I take this medicine? They need to know if you have any of these conditions: -kidney disease -latex allergy -ongoing radiation therapy -sickle cell disease -skin reactions to acrylic adhesives (On-Body Injector only) -an unusual or allergic reaction to pegfilgrastim, filgrastim, other medicines, foods, dyes, or preservatives -pregnant or trying to get pregnant -breast-feeding How should I use this medicine? This medicine is for injection under the skin. If you get this medicine at home, you will be taught how to prepare and give the pre-filled syringe or how to use the On-body Injector. Refer to the patient Instructions for Use for detailed instructions. Use exactly as directed. Take your medicine at regular intervals. Do not take your medicine more often than directed. It is important that you put your used needles and syringes in a special sharps container. Do not put them in a trash can. If you do not have a sharps container, call your pharmacist or healthcare provider to get one. Talk to your pediatrician regarding the use of this medicine in children. While this drug may be prescribed for selected conditions, precautions do apply. Overdosage: If you think you have taken too much of this medicine contact a poison control center or emergency room at  once. NOTE: This medicine is only for you. Do not share this medicine with others. What if I miss a dose? It is important not to miss your dose. Call your doctor or health care professional if you miss your dose. If you miss a dose due to an On-body Injector failure or leakage, a new dose should be administered as soon as possible using a single prefilled syringe for manual use. What may interact with this medicine? Interactions have not been studied. Give your health care provider a list of all the medicines, herbs, non-prescription drugs, or dietary supplements you use. Also tell them if you smoke, drink alcohol, or use illegal drugs. Some items may interact with your medicine. This list may not describe all possible interactions. Give your health care provider a list of all the medicines, herbs, non-prescription drugs, or dietary supplements you use. Also tell them if you smoke, drink alcohol, or use illegal drugs. Some items may interact with your medicine. What should I watch for while using this medicine? You may need blood work done while you are taking this medicine. If you are going to need a MRI, CT scan, or other procedure, tell your doctor that you are using this medicine (On-Body Injector only). What side effects may I notice from receiving this medicine? Side effects that you should report to your doctor or health care professional as soon as possible: -allergic reactions like skin rash, itching or hives, swelling of the face, lips, or tongue -dizziness -fever -pain, redness, or irritation at site where injected -pinpoint red spots on the skin -red or dark-brown urine -shortness of breath or breathing problems -stomach or side pain, or pain   at the shoulder -swelling -tiredness -trouble passing urine or change in the amount of urine Side effects that usually do not require medical attention (report to your doctor or health care professional if they continue or are  bothersome): -bone pain -muscle pain This list may not describe all possible side effects. Call your doctor for medical advice about side effects. You may report side effects to FDA at 1-800-FDA-1088. Where should I keep my medicine? Keep out of the reach of children. Store pre-filled syringes in a refrigerator between 2 and 8 degrees C (36 and 46 degrees F). Do not freeze. Keep in carton to protect from light. Throw away this medicine if it is left out of the refrigerator for more than 48 hours. Throw away any unused medicine after the expiration date. NOTE: This sheet is a summary. It may not cover all possible information. If you have questions about this medicine, talk to your doctor, pharmacist, or health care provider.    2016, Elsevier/[employer] Standard. (2014-06-20 14:30:14)  

## 2016-04-19 ENCOUNTER — Telehealth: Payer: Self-pay | Admitting: *Deleted

## 2016-04-19 NOTE — Telephone Encounter (Signed)
Notified pt to increase potassium in diet. Gave pt list of examples.

## 2016-04-20 ENCOUNTER — Telehealth: Payer: Self-pay | Admitting: Medical Oncology

## 2016-04-20 ENCOUNTER — Other Ambulatory Visit (HOSPITAL_BASED_OUTPATIENT_CLINIC_OR_DEPARTMENT_OTHER): Payer: No Typology Code available for payment source

## 2016-04-20 DIAGNOSIS — C3411 Malignant neoplasm of upper lobe, right bronchus or lung: Secondary | ICD-10-CM | POA: Diagnosis not present

## 2016-04-20 DIAGNOSIS — C3491 Malignant neoplasm of unspecified part of right bronchus or lung: Secondary | ICD-10-CM

## 2016-04-20 LAB — CBC WITH DIFFERENTIAL/PLATELET
BASO%: 0.3 % (ref 0.0–2.0)
BASOS ABS: 0.1 10*3/uL (ref 0.0–0.1)
EOS ABS: 0 10*3/uL (ref 0.0–0.5)
EOS%: 0.1 % (ref 0.0–7.0)
HEMATOCRIT: 31.4 % — AB (ref 34.8–46.6)
HEMOGLOBIN: 10.9 g/dL — AB (ref 11.6–15.9)
LYMPH#: 1.3 10*3/uL (ref 0.9–3.3)
LYMPH%: 7.4 % — ABNORMAL LOW (ref 14.0–49.7)
MCH: 29.8 pg (ref 25.1–34.0)
MCHC: 34.7 g/dL (ref 31.5–36.0)
MCV: 85.8 fL (ref 79.5–101.0)
MONO#: 1.6 10*3/uL — ABNORMAL HIGH (ref 0.1–0.9)
MONO%: 9.2 % (ref 0.0–14.0)
NEUT%: 83 % — ABNORMAL HIGH (ref 38.4–76.8)
NEUTROS ABS: 14.3 10*3/uL — AB (ref 1.5–6.5)
Platelets: 283 10*3/uL (ref 145–400)
RBC: 3.66 10*6/uL — ABNORMAL LOW (ref 3.70–5.45)
RDW: 14.3 % (ref 11.2–14.5)
WBC: 17.3 10*3/uL — AB (ref 3.9–10.3)

## 2016-04-20 LAB — COMPREHENSIVE METABOLIC PANEL
ALBUMIN: 3.4 g/dL — AB (ref 3.5–5.0)
ALK PHOS: 164 U/L — AB (ref 40–150)
ALT: 27 U/L (ref 0–55)
AST: 15 U/L (ref 5–34)
Anion Gap: 10 mEq/L (ref 3–11)
BILIRUBIN TOTAL: 0.22 mg/dL (ref 0.20–1.20)
BUN: 11.7 mg/dL (ref 7.0–26.0)
CALCIUM: 9.1 mg/dL (ref 8.4–10.4)
CO2: 24 mEq/L (ref 22–29)
CREATININE: 0.6 mg/dL (ref 0.6–1.1)
Chloride: 99 mEq/L (ref 98–109)
EGFR: >90 mL/min/{1.73_m2} (ref >90)
GLUCOSE: 115 mg/dL (ref 70–140)
POTASSIUM: 3.6 meq/L (ref 3.5–5.1)
Sodium: 133 mEq/L — ABNORMAL LOW (ref 136–145)
TOTAL PROTEIN: 6.4 g/dL (ref 6.4–8.3)

## 2016-04-20 NOTE — Telephone Encounter (Signed)
err

## 2016-04-27 ENCOUNTER — Other Ambulatory Visit (HOSPITAL_BASED_OUTPATIENT_CLINIC_OR_DEPARTMENT_OTHER): Payer: No Typology Code available for payment source

## 2016-04-27 DIAGNOSIS — C3411 Malignant neoplasm of upper lobe, right bronchus or lung: Secondary | ICD-10-CM | POA: Diagnosis not present

## 2016-04-27 DIAGNOSIS — C3491 Malignant neoplasm of unspecified part of right bronchus or lung: Secondary | ICD-10-CM

## 2016-04-27 LAB — CBC WITH DIFFERENTIAL/PLATELET
BASO%: 0.2 % (ref 0.0–2.0)
Basophils Absolute: 0 10*3/uL (ref 0.0–0.1)
EOS ABS: 0 10*3/uL (ref 0.0–0.5)
EOS%: 0.2 % (ref 0.0–7.0)
HCT: 33.4 % — ABNORMAL LOW (ref 34.8–46.6)
HEMOGLOBIN: 11.3 g/dL — AB (ref 11.6–15.9)
LYMPH%: 9.5 % — ABNORMAL LOW (ref 14.0–49.7)
MCH: 29.7 pg (ref 25.1–34.0)
MCHC: 33.8 g/dL (ref 31.5–36.0)
MCV: 87.7 fL (ref 79.5–101.0)
MONO#: 1.1 10*3/uL — AB (ref 0.1–0.9)
MONO%: 6.3 % (ref 0.0–14.0)
NEUT%: 83.8 % — ABNORMAL HIGH (ref 38.4–76.8)
NEUTROS ABS: 15.3 10*3/uL — AB (ref 1.5–6.5)
Platelets: 127 10*3/uL — ABNORMAL LOW (ref 145–400)
RBC: 3.81 10*6/uL (ref 3.70–5.45)
RDW: 15.9 % — AB (ref 11.2–14.5)
WBC: 18.2 10*3/uL — AB (ref 3.9–10.3)
lymph#: 1.7 10*3/uL (ref 0.9–3.3)

## 2016-04-27 LAB — COMPREHENSIVE METABOLIC PANEL
ALBUMIN: 3.5 g/dL (ref 3.5–5.0)
ALK PHOS: 150 U/L (ref 40–150)
ALT: 32 U/L (ref 0–55)
AST: 19 U/L (ref 5–34)
Anion Gap: 10 mEq/L (ref 3–11)
BILIRUBIN TOTAL: 0.26 mg/dL (ref 0.20–1.20)
BUN: 8.5 mg/dL (ref 7.0–26.0)
CO2: 24 mEq/L (ref 22–29)
CREATININE: 0.7 mg/dL (ref 0.6–1.1)
Calcium: 9.6 mg/dL (ref 8.4–10.4)
Chloride: 105 mEq/L (ref 98–109)
GLUCOSE: 95 mg/dL (ref 70–140)
Potassium: 3.7 mEq/L (ref 3.5–5.1)
SODIUM: 139 meq/L (ref 136–145)
TOTAL PROTEIN: 6.9 g/dL (ref 6.4–8.3)

## 2016-04-28 ENCOUNTER — Telehealth: Payer: Self-pay

## 2016-04-28 NOTE — Telephone Encounter (Signed)
Mailed FMLA forms for [employer] to [employer]

## 2016-04-30 ENCOUNTER — Ambulatory Visit (HOSPITAL_COMMUNITY)
Admission: RE | Admit: 2016-04-30 | Discharge: 2016-04-30 | Disposition: A | Discharge status: Home / Self Care | Payer: No Typology Code available for payment source | Source: Ambulatory Visit | Attending: Internal Medicine | Admitting: Internal Medicine

## 2016-04-30 DIAGNOSIS — C3491 Malignant neoplasm of unspecified part of right bronchus or lung: Secondary | ICD-10-CM | POA: Insufficient documentation

## 2016-04-30 DIAGNOSIS — M899 Disorder of bone, unspecified: Secondary | ICD-10-CM | POA: Diagnosis not present

## 2016-04-30 DIAGNOSIS — Z5111 Encounter for antineoplastic chemotherapy: Secondary | ICD-10-CM | POA: Insufficient documentation

## 2016-04-30 DIAGNOSIS — D3501 Benign neoplasm of right adrenal gland: Secondary | ICD-10-CM | POA: Diagnosis not present

## 2016-04-30 MED ORDER — SODIUM CHLORIDE 0.9 % IJ SOLN
INTRAMUSCULAR | Status: AC
  Filled 2016-04-30: qty 50

## 2016-04-30 MED ORDER — IOPAMIDOL (ISOVUE-300) INJECTION 61%
100.0000 mL | Freq: Once | INTRAVENOUS | Status: AC | PRN
  Administered 2016-04-30: 100 mL via INTRAVENOUS

## 2016-04-30 MED ORDER — IOPAMIDOL (ISOVUE-300) INJECTION 61%
INTRAVENOUS | Status: AC
  Filled 2016-04-30: qty 100

## 2016-05-03 ENCOUNTER — Other Ambulatory Visit (HOSPITAL_BASED_OUTPATIENT_CLINIC_OR_DEPARTMENT_OTHER): Payer: No Typology Code available for payment source

## 2016-05-03 ENCOUNTER — Ambulatory Visit (HOSPITAL_BASED_OUTPATIENT_CLINIC_OR_DEPARTMENT_OTHER): Payer: No Typology Code available for payment source | Admitting: Nurse Practitioner

## 2016-05-03 ENCOUNTER — Ambulatory Visit (HOSPITAL_BASED_OUTPATIENT_CLINIC_OR_DEPARTMENT_OTHER): Payer: No Typology Code available for payment source

## 2016-05-03 VITALS — HR 78

## 2016-05-03 DIAGNOSIS — C7951 Secondary malignant neoplasm of bone: Secondary | ICD-10-CM | POA: Diagnosis not present

## 2016-05-03 DIAGNOSIS — C3411 Malignant neoplasm of upper lobe, right bronchus or lung: Secondary | ICD-10-CM

## 2016-05-03 DIAGNOSIS — C3491 Malignant neoplasm of unspecified part of right bronchus or lung: Secondary | ICD-10-CM

## 2016-05-03 DIAGNOSIS — Z5111 Encounter for antineoplastic chemotherapy: Secondary | ICD-10-CM

## 2016-05-03 LAB — COMPREHENSIVE METABOLIC PANEL
ALK PHOS: 110 U/L (ref 40–150)
ALT: 28 U/L (ref 0–55)
ANION GAP: 10 meq/L (ref 3–11)
AST: 16 U/L (ref 5–34)
Albumin: 3.7 g/dL (ref 3.5–5.0)
BILIRUBIN TOTAL: 0.27 mg/dL (ref 0.20–1.20)
BUN: 8.4 mg/dL (ref 7.0–26.0)
CO2: 24 meq/L (ref 22–29)
Calcium: 9.7 mg/dL (ref 8.4–10.4)
Chloride: 104 mEq/L (ref 98–109)
Creatinine: 0.6 mg/dL (ref 0.6–1.1)
Glucose: 112 mg/dl (ref 70–140)
Potassium: 4.2 mEq/L (ref 3.5–5.1)
Sodium: 138 mEq/L (ref 136–145)
TOTAL PROTEIN: 7.2 g/dL (ref 6.4–8.3)

## 2016-05-03 LAB — CBC WITH DIFFERENTIAL/PLATELET
BASO%: 1.4 % (ref 0.0–2.0)
BASOS ABS: 0.2 10*3/uL — AB (ref 0.0–0.1)
EOS ABS: 0 10*3/uL (ref 0.0–0.5)
EOS%: 0.3 % (ref 0.0–7.0)
HCT: 35.3 % (ref 34.8–46.6)
HGB: 11.7 g/dL (ref 11.6–15.9)
LYMPH%: 7.6 % — AB (ref 14.0–49.7)
MCH: 29.1 pg (ref 25.1–34.0)
MCHC: 33.1 g/dL (ref 31.5–36.0)
MCV: 88.1 fL (ref 79.5–101.0)
MONO#: 0.9 10*3/uL (ref 0.1–0.9)
MONO%: 6.6 % (ref 0.0–14.0)
NEUT%: 84.1 % — AB (ref 38.4–76.8)
NEUTROS ABS: 12 10*3/uL — AB (ref 1.5–6.5)
PLATELETS: 307 10*3/uL (ref 145–400)
RBC: 4 10*6/uL (ref 3.70–5.45)
RDW: 16.2 % — ABNORMAL HIGH (ref 11.2–14.5)
WBC: 14.3 10*3/uL — ABNORMAL HIGH (ref 3.9–10.3)
lymph#: 1.1 10*3/uL (ref 0.9–3.3)

## 2016-05-03 MED ORDER — DEXAMETHASONE SODIUM PHOSPHATE 10 MG/ML IJ SOLN
10.0000 mg | Freq: Once | INTRAMUSCULAR | Status: AC
  Administered 2016-05-03: 10 mg via INTRAVENOUS

## 2016-05-03 MED ORDER — PROCHLORPERAZINE MALEATE 10 MG PO TABS
10.0000 mg | ORAL_TABLET | Freq: Four times a day (QID) | ORAL | 0 refills | Status: DC | PRN
Start: 2016-05-03 — End: 2016-07-06

## 2016-05-03 MED ORDER — SODIUM CHLORIDE 0.9 % IV SOLN
Freq: Once | INTRAVENOUS | Status: AC
  Administered 2016-05-03: 14:00:00 via INTRAVENOUS

## 2016-05-03 MED ORDER — PALONOSETRON HCL INJECTION 0.25 MG/5ML
INTRAVENOUS | Status: AC
  Filled 2016-05-03: qty 5

## 2016-05-03 MED ORDER — DEXAMETHASONE SODIUM PHOSPHATE 10 MG/ML IJ SOLN
INTRAMUSCULAR | Status: AC
  Filled 2016-05-03: qty 1

## 2016-05-03 MED ORDER — SODIUM CHLORIDE 0.9 % IV SOLN
100.0000 mg/m2 | Freq: Once | INTRAVENOUS | Status: AC
  Administered 2016-05-03: 170 mg via INTRAVENOUS
  Filled 2016-05-03: qty 8.5

## 2016-05-03 MED ORDER — PALONOSETRON HCL INJECTION 0.25 MG/5ML
0.2500 mg | Freq: Once | INTRAVENOUS | Status: AC
  Administered 2016-05-03: 0.25 mg via INTRAVENOUS

## 2016-05-03 MED ORDER — TEMAZEPAM 30 MG PO CAPS: 30.0000 mg | ORAL_CAPSULE | Freq: Every evening | ORAL | 0 refills | Status: DC | PRN

## 2016-05-03 MED ORDER — SODIUM CHLORIDE 0.9 % IV SOLN
503.0000 mg | Freq: Once | INTRAVENOUS | Status: AC
  Administered 2016-05-03: 500 mg via INTRAVENOUS
  Filled 2016-05-03: qty 50

## 2016-05-03 NOTE — Progress Notes (Addendum)
[facility] OFFICE PROGRESS NOTE   DIAGNOSIS: Extensive stage (T1b, N3, M1b) small cell lung cancer presented with right upper lobe lung mass in addition to massive mediastinal lymphadenopathy and metastatic bone disease diagnosed in October 2017  PRIOR THERAPY:  Palliative radiotherapy to the large mediastinal mass under the care of Dr. Tammi Klippel.  CURRENT THERAPY:  1) Systemic chemotherapy with carboplatin for AUC of 5 on day 1 and etoposide 100 MG/M2 on days 1, 2 and 3 with Neulasta support. First dose 03/23/2016. Status post 2 cycles.  INTERVAL HISTORY:   [name] returns as scheduled. She completed cycle 2 carboplatin/etoposide beginning 04/13/2016. She has occasional nausea. No mouth sores. No diarrhea or constipation. She has mild dyspnea on exertion. Breathing is improved as compared to prechemotherapy. Cough and dysphagia have improved as well. Voice is less hoarse.  Objective:  Vital signs in last 24 hours:  Blood pressure 125/88, pulse (!) 101, temperature 98.4 F (36.9 C), temperature source Oral, resp. rate 16, height '5\' 2"'$  (1.575 m), weight 150 lb 9.6 oz (68.3 kg), SpO2 98 %.    HEENT: No thrush or ulcers. Resp: Scattered coarse expiratory wheezes. Cardio: Regular rate and rhythm. GI: Abdomen soft and nontender. No hepatomegaly. Vascular: No leg edema.  Skin: No rash.    Lab Results:  Lab Results  Component Value Date   WBC 14.3 (H) 05/03/2016   HGB 11.7 05/03/2016   HCT 35.3 05/03/2016   MCV 88.1 05/03/2016   PLT 307 05/03/2016   NEUTROABS 12.0 (H) 05/03/2016    Imaging:  No results found.  Medications: I have reviewed the patient's current medications.  Assessment/Plan: 1. Extensive stage small cell lung cancer presenting with large central lung mass involving the mediastinum and encasing the pulmonary arteries and portion of the thoracic aorta and aortic arch branches with narrowing of the main pulmonary arteries. There was also a  right upper lobe lung mass as well as metastatic bone disease. She received palliative radiotherapy to the large right upper lobe lung mass. Status post cycle 1 carboplatin/etoposide 03/23/2016. Status post cycle 2 carboplatin/etoposide 04/13/2016. Restaging CT evaluation 05/01/2016 with significant improvement.    Disposition: [name] appears stable. She has completed 2 cycles of carboplatin/etoposide. The recent restaging CT scans showed significant improvement. Dr. Julien Nordmann recommends continuation of carboplatin/etoposide. She will proceed with cycle 3 today as scheduled. She will return for a follow-up visit and cycle 4 in 3 weeks. She will contact the office in the interim with any problems.  Patient seen with Dr. Julien Nordmann. CT images reviewed on the computer by Dr. Julien Nordmann with [name] and her son.  Translator present throughout today's visit.    Ned Card ANP/GNP-BC   05/03/2016  1:22 PM   ADDENDUM: Hematology/Oncology Attending: I had a face to face encounter with the patient today. I recommended her care plan. This is a very pleasant 65 years old white female with extensive stage small cell lung cancer currently undergoing systemic chemotherapy with carboplatin and etoposide status post 2 cycles. The patient tolerated the second cycle of her treatment fairly well with no significant adverse effects. He denied having any fever or chills. She has no nausea or vomiting. She had a recent CT scan of the chest, abdomen and pelvis that showed significant improvement of her disease. I personally reviewed the images and discussed the scan results and showed the images to the patient and her family. I recommended for her to continue her current treatment with systemic chemotherapy with  carboplatin and etoposide. She will receive cycle #3 today. The patient would come back for follow-up visit in 3 weeks for evaluation before starting cycle #4. She was advised to call immediately if she  has any concerning symptoms in the interval.  Disclaimer: This note was dictated with voice recognition software. Similar sounding words can inadvertently be transcribed and may be missed upon review. [name]., MD 05/03/16

## 2016-05-03 NOTE — Patient Instructions (Signed)
[facility] Discharge Instructions for Patients Receiving Chemotherapy  Today you received the following chemotherapy agents :  Carboplatin, Etoposide.  To help prevent nausea and vomiting after your treatment, we encourage you to take your nausea medication as prescribed.   If you develop nausea and vomiting that is not controlled by your nausea medication, call the clinic.   BELOW ARE SYMPTOMS THAT SHOULD BE REPORTED IMMEDIATELY:  *FEVER GREATER THAN 100.5 F  *CHILLS WITH OR WITHOUT FEVER  NAUSEA AND VOMITING THAT IS NOT CONTROLLED WITH YOUR NAUSEA MEDICATION  *UNUSUAL SHORTNESS OF BREATH  *UNUSUAL BRUISING OR BLEEDING  TENDERNESS IN MOUTH AND THROAT WITH OR WITHOUT PRESENCE OF ULCERS  *URINARY PROBLEMS  *BOWEL PROBLEMS  UNUSUAL RASH Items with * indicate a potential emergency and should be followed up as soon as possible.  Feel free to call the clinic you have any questions or concerns. The clinic phone number is [phone].  Please show the [facility] at check-in to the Emergency Department and triage nurse.

## 2016-05-04 ENCOUNTER — Ambulatory Visit (HOSPITAL_BASED_OUTPATIENT_CLINIC_OR_DEPARTMENT_OTHER): Payer: No Typology Code available for payment source

## 2016-05-04 ENCOUNTER — Other Ambulatory Visit: Payer: No Typology Code available for payment source

## 2016-05-04 VITALS — BP 111/68 | HR 99 | Temp 98.0°F | Resp 18

## 2016-05-04 DIAGNOSIS — Z5111 Encounter for antineoplastic chemotherapy: Secondary | ICD-10-CM | POA: Diagnosis not present

## 2016-05-04 DIAGNOSIS — C3411 Malignant neoplasm of upper lobe, right bronchus or lung: Secondary | ICD-10-CM | POA: Diagnosis not present

## 2016-05-04 DIAGNOSIS — C3491 Malignant neoplasm of unspecified part of right bronchus or lung: Secondary | ICD-10-CM

## 2016-05-04 MED ORDER — DEXAMETHASONE SODIUM PHOSPHATE 10 MG/ML IJ SOLN
INTRAMUSCULAR | Status: AC
  Filled 2016-05-04: qty 1

## 2016-05-04 MED ORDER — SODIUM CHLORIDE 0.9 % IV SOLN
Freq: Once | INTRAVENOUS | Status: AC
  Administered 2016-05-04: 15:00:00 via INTRAVENOUS

## 2016-05-04 MED ORDER — DEXAMETHASONE SODIUM PHOSPHATE 10 MG/ML IJ SOLN
10.0000 mg | Freq: Once | INTRAMUSCULAR | Status: AC
Start: 2016-05-04 — End: 2016-05-04
  Administered 2016-05-04: 10 mg via INTRAVENOUS

## 2016-05-04 MED ORDER — SODIUM CHLORIDE 0.9 % IV SOLN
100.0000 mg/m2 | Freq: Once | INTRAVENOUS | Status: AC
  Administered 2016-05-04: 170 mg via INTRAVENOUS
  Filled 2016-05-04: qty 8.5

## 2016-05-05 ENCOUNTER — Ambulatory Visit (HOSPITAL_BASED_OUTPATIENT_CLINIC_OR_DEPARTMENT_OTHER): Payer: No Typology Code available for payment source

## 2016-05-05 VITALS — BP 116/87 | HR 97 | Temp 97.9°F | Resp 18

## 2016-05-05 DIAGNOSIS — C3411 Malignant neoplasm of upper lobe, right bronchus or lung: Secondary | ICD-10-CM

## 2016-05-05 DIAGNOSIS — Z5111 Encounter for antineoplastic chemotherapy: Secondary | ICD-10-CM

## 2016-05-05 DIAGNOSIS — C3491 Malignant neoplasm of unspecified part of right bronchus or lung: Secondary | ICD-10-CM

## 2016-05-05 DIAGNOSIS — C7951 Secondary malignant neoplasm of bone: Secondary | ICD-10-CM | POA: Diagnosis not present

## 2016-05-05 MED ORDER — SODIUM CHLORIDE 0.9 % IV SOLN
Freq: Once | INTRAVENOUS | Status: AC
  Administered 2016-05-05: 14:00:00 via INTRAVENOUS

## 2016-05-05 MED ORDER — DEXAMETHASONE SODIUM PHOSPHATE 10 MG/ML IJ SOLN
10.0000 mg | Freq: Once | INTRAMUSCULAR | Status: AC
Start: 2016-05-05 — End: 2016-05-05
  Administered 2016-05-05: 10 mg via INTRAVENOUS

## 2016-05-05 MED ORDER — SODIUM CHLORIDE 0.9 % IV SOLN
100.0000 mg/m2 | Freq: Once | INTRAVENOUS | Status: AC
  Administered 2016-05-05: 170 mg via INTRAVENOUS
  Filled 2016-05-05: qty 8.5

## 2016-05-05 MED ORDER — DEXAMETHASONE SODIUM PHOSPHATE 10 MG/ML IJ SOLN
INTRAMUSCULAR | Status: AC
  Filled 2016-05-05: qty 1

## 2016-05-05 NOTE — Patient Instructions (Signed)
[facility] Discharge Instructions for Patients Receiving Chemotherapy  Today you received the following chemotherapy agents Etoposide.   To help prevent nausea and vomiting after your treatment, we encourage you to take your nausea medication as prescribed.   If you develop nausea and vomiting that is not controlled by your nausea medication, call the clinic.   BELOW ARE SYMPTOMS THAT SHOULD BE REPORTED IMMEDIATELY:  *FEVER GREATER THAN 100.5 F  *CHILLS WITH OR WITHOUT FEVER  NAUSEA AND VOMITING THAT IS NOT CONTROLLED WITH YOUR NAUSEA MEDICATION  *UNUSUAL SHORTNESS OF BREATH  *UNUSUAL BRUISING OR BLEEDING  TENDERNESS IN MOUTH AND THROAT WITH OR WITHOUT PRESENCE OF ULCERS  *URINARY PROBLEMS  *BOWEL PROBLEMS  UNUSUAL RASH Items with * indicate a potential emergency and should be followed up as soon as possible.  Feel free to call the clinic you have any questions or concerns. The clinic phone number is [phone].  Please show the CHEMO ALERT CARD at check-in to the Emergency Department and triage nurse.   

## 2016-05-07 ENCOUNTER — Ambulatory Visit (HOSPITAL_BASED_OUTPATIENT_CLINIC_OR_DEPARTMENT_OTHER): Payer: No Typology Code available for payment source

## 2016-05-07 VITALS — BP 116/74 | HR 100 | Temp 98.3°F

## 2016-05-07 DIAGNOSIS — C3411 Malignant neoplasm of upper lobe, right bronchus or lung: Secondary | ICD-10-CM

## 2016-05-07 DIAGNOSIS — C7951 Secondary malignant neoplasm of bone: Secondary | ICD-10-CM | POA: Diagnosis not present

## 2016-05-07 DIAGNOSIS — C3491 Malignant neoplasm of unspecified part of right bronchus or lung: Secondary | ICD-10-CM

## 2016-05-07 MED ORDER — PEGFILGRASTIM INJECTION 6 MG/0.6ML ~~LOC~~
6.0000 mg | PREFILLED_SYRINGE | Freq: Once | SUBCUTANEOUS | Status: AC
  Administered 2016-05-07: 6 mg via SUBCUTANEOUS
  Filled 2016-05-07: qty 0.6

## 2016-05-07 NOTE — Patient Instructions (Signed)
Pegfilgrastim injection What is this medicine? PEGFILGRASTIM (PEG fil gra stim) is a long-acting granulocyte colony-stimulating factor that stimulates the growth of neutrophils, a type of white blood cell important in the body's fight against infection. It is used to reduce the incidence of fever and infection in patients with certain types of cancer who are receiving chemotherapy that affects the bone marrow, and to increase survival after being exposed to high doses of radiation. This medicine may be used for other purposes; ask your health care provider or pharmacist if you have questions. What should I tell my health care provider before I take this medicine? They need to know if you have any of these conditions: -kidney disease -latex allergy -ongoing radiation therapy -sickle cell disease -skin reactions to acrylic adhesives (On-Body Injector only) -an unusual or allergic reaction to pegfilgrastim, filgrastim, other medicines, foods, dyes, or preservatives -pregnant or trying to get pregnant -breast-feeding How should I use this medicine? This medicine is for injection under the skin. If you get this medicine at home, you will be taught how to prepare and give the pre-filled syringe or how to use the On-body Injector. Refer to the patient Instructions for Use for detailed instructions. Use exactly as directed. Take your medicine at regular intervals. Do not take your medicine more often than directed. It is important that you put your used needles and syringes in a special sharps container. Do not put them in a trash can. If you do not have a sharps container, call your pharmacist or healthcare provider to get one. Talk to your pediatrician regarding the use of this medicine in children. While this drug may be prescribed for selected conditions, precautions do apply. Overdosage: If you think you have taken too much of this medicine contact a poison control center or emergency room at  once. NOTE: This medicine is only for you. Do not share this medicine with others. What if I miss a dose? It is important not to miss your dose. Call your doctor or health care professional if you miss your dose. If you miss a dose due to an On-body Injector failure or leakage, a new dose should be administered as soon as possible using a single prefilled syringe for manual use. What may interact with this medicine? Interactions have not been studied. Give your health care provider a list of all the medicines, herbs, non-prescription drugs, or dietary supplements you use. Also tell them if you smoke, drink alcohol, or use illegal drugs. Some items may interact with your medicine. This list may not describe all possible interactions. Give your health care provider a list of all the medicines, herbs, non-prescription drugs, or dietary supplements you use. Also tell them if you smoke, drink alcohol, or use illegal drugs. Some items may interact with your medicine. What should I watch for while using this medicine? You may need blood work done while you are taking this medicine. If you are going to need a MRI, CT scan, or other procedure, tell your doctor that you are using this medicine (On-Body Injector only). What side effects may I notice from receiving this medicine? Side effects that you should report to your doctor or health care professional as soon as possible: -allergic reactions like skin rash, itching or hives, swelling of the face, lips, or tongue -dizziness -fever -pain, redness, or irritation at site where injected -pinpoint red spots on the skin -red or dark-brown urine -shortness of breath or breathing problems -stomach or side pain, or pain   at the shoulder -swelling -tiredness -trouble passing urine or change in the amount of urine Side effects that usually do not require medical attention (report to your doctor or health care professional if they continue or are  bothersome): -bone pain -muscle pain This list may not describe all possible side effects. Call your doctor for medical advice about side effects. You may report side effects to FDA at 1-800-FDA-1088. Where should I keep my medicine? Keep out of the reach of children. Store pre-filled syringes in a refrigerator between 2 and 8 degrees C (36 and 46 degrees F). Do not freeze. Keep in carton to protect from light. Throw away this medicine if it is left out of the refrigerator for more than 48 hours. Throw away any unused medicine after the expiration date. NOTE: This sheet is a summary. It may not cover all possible information. If you have questions about this medicine, talk to your doctor, pharmacist, or health care provider.    2016, Elsevier/[employer] Standard. (2014-06-20 14:30:14)  

## 2016-05-11 ENCOUNTER — Other Ambulatory Visit (HOSPITAL_BASED_OUTPATIENT_CLINIC_OR_DEPARTMENT_OTHER): Payer: No Typology Code available for payment source

## 2016-05-11 DIAGNOSIS — C3411 Malignant neoplasm of upper lobe, right bronchus or lung: Secondary | ICD-10-CM | POA: Diagnosis not present

## 2016-05-11 DIAGNOSIS — C3491 Malignant neoplasm of unspecified part of right bronchus or lung: Secondary | ICD-10-CM

## 2016-05-11 LAB — CBC WITH DIFFERENTIAL/PLATELET
BASO%: 0.3 % (ref 0.0–2.0)
BASOS ABS: 0 10*3/uL (ref 0.0–0.1)
EOS ABS: 0.1 10*3/uL (ref 0.0–0.5)
EOS%: 0.6 % (ref 0.0–7.0)
HEMATOCRIT: 30.1 % — AB (ref 34.8–46.6)
HEMOGLOBIN: 10.2 g/dL — AB (ref 11.6–15.9)
LYMPH#: 1.6 10*3/uL (ref 0.9–3.3)
LYMPH%: 14.4 % (ref 14.0–49.7)
MCH: 30.2 pg (ref 25.1–34.0)
MCHC: 33.9 g/dL (ref 31.5–36.0)
MCV: 89.1 fL (ref 79.5–101.0)
MONO#: 1.8 10*3/uL — AB (ref 0.1–0.9)
MONO%: 16.3 % — ABNORMAL HIGH (ref 0.0–14.0)
NEUT%: 68.4 % (ref 38.4–76.8)
NEUTROS ABS: 7.7 10*3/uL — AB (ref 1.5–6.5)
Platelets: 184 10*3/uL (ref 145–400)
RBC: 3.38 10*6/uL — ABNORMAL LOW (ref 3.70–5.45)
RDW: 16.3 % — AB (ref 11.2–14.5)
WBC: 11.2 10*3/uL — AB (ref 3.9–10.3)

## 2016-05-11 LAB — COMPREHENSIVE METABOLIC PANEL
ALBUMIN: 3.6 g/dL (ref 3.5–5.0)
ALK PHOS: 140 U/L (ref 40–150)
ALT: 24 U/L (ref 0–55)
AST: 11 U/L (ref 5–34)
Anion Gap: 9 mEq/L (ref 3–11)
BUN: 11.5 mg/dL (ref 7.0–26.0)
CALCIUM: 9.5 mg/dL (ref 8.4–10.4)
CO2: 30 mEq/L — ABNORMAL HIGH (ref 22–29)
Chloride: 98 mEq/L (ref 98–109)
Creatinine: 0.7 mg/dL (ref 0.6–1.1)
GLUCOSE: 90 mg/dL (ref 70–140)
POTASSIUM: 3.6 meq/L (ref 3.5–5.1)
Sodium: 137 mEq/L (ref 136–145)
TOTAL PROTEIN: 6.8 g/dL (ref 6.4–8.3)

## 2016-05-14 NOTE — Progress Notes (Signed)
[name] is here for a one month follow up appointment for small cell lung cancer right upper lobe.  Weight changes, if any: Wt Readings from Last 3 Encounters:  05/18/16 154 lb 12.8 oz (70.2 kg)  05/03/16 150 lb 9.6 oz (68.3 kg)  04/13/16 147 lb 3.2 oz (66.8 kg)   Respiratory complaints, if any: Coughing up clear to light  brownish mucous secretion Hemoptysis, if any:  No Swallowing Problems/Pain/Difficulty swallowing:None Feels something has stopped in her throat from time to time like when she has nausea Smoking Tobacco/Marijuana/Snuff/ETOH use:Fomer smoker cigarettes 1 p/d x 37 years, quit 03-10-16 no alcohol or drugs Skin:Normal color to chest Pain : In her back sometimes none today. Appetite:Good Fatigue:Having mild fatigue When is next chemo scheduled?:systemic chemotherapy carboplatin and etoposide to receive 4 th cycle next week Lab work from of chart:05-17-16 CBC w diff, Cmet, 04-20-16 CT chest w wo contrast and CT abdomen pelvis w contrast BP 112/86   Pulse 94   Temp 98.7 F (37.1 C) (Oral)   Resp 18   Ht '5\' 2"'$  (1.575 m)   Wt 154 lb 12.8 oz (70.2 kg)   SpO2 99%   BMI 28.31 kg/m

## 2016-05-18 ENCOUNTER — Other Ambulatory Visit (HOSPITAL_BASED_OUTPATIENT_CLINIC_OR_DEPARTMENT_OTHER): Payer: No Typology Code available for payment source

## 2016-05-18 ENCOUNTER — Ambulatory Visit
Admission: RE | Admit: 2016-05-18 | Discharge: 2016-05-18 | Disposition: A | Discharge status: Home / Self Care | Payer: No Typology Code available for payment source | Source: Ambulatory Visit | Attending: Radiation Oncology | Admitting: Radiation Oncology

## 2016-05-18 ENCOUNTER — Encounter: Payer: Self-pay | Admitting: Radiation Oncology

## 2016-05-18 VITALS — BP 112/86 | HR 94 | Temp 98.7°F | Resp 18 | Ht 62.0 in | Wt 154.8 lb

## 2016-05-18 DIAGNOSIS — C3491 Malignant neoplasm of unspecified part of right bronchus or lung: Secondary | ICD-10-CM

## 2016-05-18 DIAGNOSIS — Z79899 Other long term (current) drug therapy: Secondary | ICD-10-CM | POA: Diagnosis not present

## 2016-05-18 DIAGNOSIS — I871 Compression of vein: Secondary | ICD-10-CM | POA: Diagnosis not present

## 2016-05-18 DIAGNOSIS — Z880 Allergy status to penicillin: Secondary | ICD-10-CM | POA: Diagnosis not present

## 2016-05-18 DIAGNOSIS — C3411 Malignant neoplasm of upper lobe, right bronchus or lung: Secondary | ICD-10-CM | POA: Diagnosis not present

## 2016-05-18 LAB — CBC WITH DIFFERENTIAL/PLATELET
BASO%: 0.4 % (ref 0.0–2.0)
Basophils Absolute: 0.1 10*3/uL (ref 0.0–0.1)
EOS%: 0.3 % (ref 0.0–7.0)
Eosinophils Absolute: 0 10*3/uL (ref 0.0–0.5)
HEMATOCRIT: 33 % — AB (ref 34.8–46.6)
HEMOGLOBIN: 10.9 g/dL — AB (ref 11.6–15.9)
LYMPH#: 1.2 10*3/uL (ref 0.9–3.3)
LYMPH%: 9.1 % — ABNORMAL LOW (ref 14.0–49.7)
MCH: 29.6 pg (ref 25.1–34.0)
MCHC: 32.9 g/dL (ref 31.5–36.0)
MCV: 90 fL (ref 79.5–101.0)
MONO#: 0.8 10*3/uL (ref 0.1–0.9)
MONO%: 5.9 % (ref 0.0–14.0)
NEUT%: 84.3 % — ABNORMAL HIGH (ref 38.4–76.8)
NEUTROS ABS: 11.3 10*3/uL — AB (ref 1.5–6.5)
Platelets: 143 10*3/uL — ABNORMAL LOW (ref 145–400)
RBC: 3.66 10*6/uL — ABNORMAL LOW (ref 3.70–5.45)
RDW: 18.6 % — AB (ref 11.2–14.5)
WBC: 13.4 10*3/uL — AB (ref 3.9–10.3)

## 2016-05-18 LAB — COMPREHENSIVE METABOLIC PANEL
ALBUMIN: 3.4 g/dL — AB (ref 3.5–5.0)
ALK PHOS: 118 U/L (ref 40–150)
ALT: 32 U/L (ref 0–55)
AST: 17 U/L (ref 5–34)
Anion Gap: 10 mEq/L (ref 3–11)
BILIRUBIN TOTAL: 0.22 mg/dL (ref 0.20–1.20)
BUN: 10.1 mg/dL (ref 7.0–26.0)
CALCIUM: 9.3 mg/dL (ref 8.4–10.4)
CO2: 27 mEq/L (ref 22–29)
CREATININE: 0.6 mg/dL (ref 0.6–1.1)
Chloride: 101 mEq/L (ref 98–109)
EGFR: >90 mL/min/{1.73_m2} (ref >90)
GLUCOSE: 117 mg/dL (ref 70–140)
POTASSIUM: 3.4 meq/L — AB (ref 3.5–5.1)
SODIUM: 137 meq/L (ref 136–145)
TOTAL PROTEIN: 6.8 g/dL (ref 6.4–8.3)

## 2016-05-18 NOTE — Progress Notes (Signed)
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   Interval Since Last Radiation:  7 weeks   03/18/16-03/31/16: The chest was treated to 30 Gy in 10 fractions of 3 Gy  Narrative:  The patient returns today for routine follow-up. The patient tolerated radiotherapy well and had improvement in her breathing during the latter part of her treatment. She also had mild fatigue. Since her last radiation treatment, she has continued with carboplatin/etoposide and completes her 4th cycle this month. She has plans to complete 6 cycles of treatment.                       On review of systems, the patient states she is feeling great and denies any upper extremity edema, shortness of breath, chest pain, swelling of her chest wall, dysphagia, nausea, vomiting, or with skin irritation. No other complaints are noted.  ALLERGIES:  is allergic to penicillins.  Meds: Current Outpatient Prescriptions  Medication Sig Dispense Refill  . atorvastatin (LIPITOR) 40 MG tablet Take 40 mg by mouth at bedtime.   0  . benzonatate (TESSALON) 100 MG capsule Take 1-2 capsules (100-200 mg total) by mouth 3 (three) times daily as needed for cough. 40 capsule 0  . hydrochlorothiazide (HYDRODIURIL) 25 MG tablet TAKE 1 TABLET BY MOUTH DAILY 30 tablet 0  . prochlorperazine (COMPAZINE) 10 MG tablet Take 1 tablet (10 mg total) by mouth every 6 (six) hours as needed for nausea or vomiting. 30 tablet 0  . temazepam (RESTORIL) 30 MG capsule Take 1 capsule (30 mg total) by mouth at bedtime as needed for sleep. 30 capsule 0  . Wound Dressings (SONAFINE) Apply 1 application topically 2 (two) times daily.     No current facility-administered  medications for this encounter.     Physical Findings:  height is '5\' 2"'$  (1.575 m) and weight is 154 lb 12.8 oz (70.2 kg). Her oral temperature is 98.7 F (37.1 C). Her blood pressure is 112/86 and her pulse is 94. Her respiration is 18 and oxygen saturation is 99%.  In general this is a well appearing [place] european female in no acute distress. She's alert and oriented x4 and appropriate throughout the examination. Cardiopulmonary assessment reveals a RRR, no C/R/M are noted, and chest is CTAB.  Lab Findings: Lab Results  Component Value Date   WBC 11.2 (H) 05/11/2016   HGB 10.2 (L) 05/11/2016   HCT 30.1 (L) 05/11/2016   MCV 89.1 05/11/2016   PLT 184 05/11/2016     Radiographic Findings: Ct Chest W Contrast  Result Date: 05/01/2016 CLINICAL DATA:  Right lung small cell carcinoma. Undergoing chemotherapy and radiation therapy. Restaging. EXAM: CT CHEST, ABDOMEN, AND PELVIS WITH CONTRAST TECHNIQUE: Multidetector CT imaging of the chest, abdomen and pelvis was performed following the standard protocol during bolus administration of intravenous contrast. CONTRAST:  126m ISOVUE-300 IOPAMIDOL (ISOVUE-300) INJECTION 61% COMPARISON:  PET-CT on 03/26/2016 and abdomen MRI on 03/03/2012 FINDINGS: CT CHEST FINDINGS Cardiovascular: No acute findings. Aortic atherosclerosis. Mild coronary artery calcification also noted. Previously seen small pericardial effusion has resolved. Mediastinum/Nodes: Previously seen mild right supraclavicular lymphadenopathy has resolved. There has been near complete resolution of  mediastinal lymphadenopathy since previous study, with largest residual area in the right paratracheal region measuring 1.9 cm in short axis on image 18/2 compared to 4.4 cm when measured at the same location on previous study. Decreased right suprahilar lymphadenopathy is also seen, currently measuring 1.2 cm on image 24/2 compared to 1.7 cm previously. No new or increased lymphadenopathy  identified. Lungs/Pleura: Central right upper lobe pulmonary nodule has decreased in size, currently measuring 1.3 x 0.7 cm on image 60/7 compared to 1.8 x 1.1 cm previously. No other pulmonary nodules or masses identified. No evidence of pleural effusion. Musculoskeletal: Subtle small sclerotic bone lesions in the thoracic spine show increased sclerosis since previous study. This is likely due to healing/treated bone metastases, with progression of bone metastases considered less likely. CT ABDOMEN AND PELVIS FINDINGS Hepatobiliary: No masses identified. Tiny sub-cm low-attenuation lesion in the posterior right hepatic lobe remains stable since 2013 MRI, consistent with tiny benign cyst. Gallbladder is unremarkable. Pancreas:  No mass or inflammatory changes. Spleen:  Within normal limits in size and appearance. Adrenals/Urinary tract: Stable 3.7 cm low-attenuation benign right adrenal adenoma. Left adrenal gland is normal in appearance. Both kidneys are normal in appearance. No evidence hydronephrosis. Unopacified urinary bladder is unremarkable in appearance. Stomach/Bowel: No evidence of obstruction, inflammatory process, or abnormal fluid collections. Vascular/Lymphatic: No pathologically enlarged lymph nodes identified. No abdominal aortic aneurysm. Aortic atherosclerosis. Reproductive: Prior hysterectomy noted. Adnexal regions are unremarkable in appearance. Other:  None. Musculoskeletal: Subtle sclerotic bone lesions are again seen throughout the spine and pelvis. Several of these lesions show increased sclerosis since previous study. This is likely due to healing/treated bone metastases, with progression of bone metastases considered less likely. IMPRESSION: Near complete resolution of mediastinal and right hilar lymphadenopathy since prior study. Interval resolution of mild right supraclavicular lymphadenopathy. Decreased size of central right upper lobe pulmonary nodule. No evidence of soft tissue  metastatic disease within the abdomen or pelvis. Subtle diffuse sclerotic bone lesions show increased sclerosis compared to recent PET-CT. This is likely due to healing/ treated bone metastases, with progression of bone metastases considered less likely. Stable benign right adrenal adenoma. Electronically Signed   By: Earle Gell M.D.   On: 05/01/2016 09:26   Ct Abdomen Pelvis W Contrast  Result Date: 05/01/2016 CLINICAL DATA:  Right lung small cell carcinoma. Undergoing chemotherapy and radiation therapy. Restaging. EXAM: CT CHEST, ABDOMEN, AND PELVIS WITH CONTRAST TECHNIQUE: Multidetector CT imaging of the chest, abdomen and pelvis was performed following the standard protocol during bolus administration of intravenous contrast. CONTRAST:  160m ISOVUE-300 IOPAMIDOL (ISOVUE-300) INJECTION 61% COMPARISON:  PET-CT on 03/26/2016 and abdomen MRI on 03/03/2012 FINDINGS: CT CHEST FINDINGS Cardiovascular: No acute findings. Aortic atherosclerosis. Mild coronary artery calcification also noted. Previously seen small pericardial effusion has resolved. Mediastinum/Nodes: Previously seen mild right supraclavicular lymphadenopathy has resolved. There has been near complete resolution of mediastinal lymphadenopathy since previous study, with largest residual area in the right paratracheal region measuring 1.9 cm in short axis on image 18/2 compared to 4.4 cm when measured at the same location on previous study. Decreased right suprahilar lymphadenopathy is also seen, currently measuring 1.2 cm on image 24/2 compared to 1.7 cm previously. No new or increased lymphadenopathy identified. Lungs/Pleura: Central right upper lobe pulmonary nodule has decreased in size, currently measuring 1.3 x 0.7 cm on image 60/7 compared to 1.8 x 1.1 cm previously. No other pulmonary nodules or masses identified. No evidence of pleural effusion. Musculoskeletal: Subtle small sclerotic bone lesions in the  thoracic spine show increased  sclerosis since previous study. This is likely due to healing/treated bone metastases, with progression of bone metastases considered less likely. CT ABDOMEN AND PELVIS FINDINGS Hepatobiliary: No masses identified. Tiny sub-cm low-attenuation lesion in the posterior right hepatic lobe remains stable since 2013 MRI, consistent with tiny benign cyst. Gallbladder is unremarkable. Pancreas:  No mass or inflammatory changes. Spleen:  Within normal limits in size and appearance. Adrenals/Urinary tract: Stable 3.7 cm low-attenuation benign right adrenal adenoma. Left adrenal gland is normal in appearance. Both kidneys are normal in appearance. No evidence hydronephrosis. Unopacified urinary bladder is unremarkable in appearance. Stomach/Bowel: No evidence of obstruction, inflammatory process, or abnormal fluid collections. Vascular/Lymphatic: No pathologically enlarged lymph nodes identified. No abdominal aortic aneurysm. Aortic atherosclerosis. Reproductive: Prior hysterectomy noted. Adnexal regions are unremarkable in appearance. Other:  None. Musculoskeletal: Subtle sclerotic bone lesions are again seen throughout the spine and pelvis. Several of these lesions show increased sclerosis since previous study. This is likely due to healing/treated bone metastases, with progression of bone metastases considered less likely. IMPRESSION: Near complete resolution of mediastinal and right hilar lymphadenopathy since prior study. Interval resolution of mild right supraclavicular lymphadenopathy. Decreased size of central right upper lobe pulmonary nodule. No evidence of soft tissue metastatic disease within the abdomen or pelvis. Subtle diffuse sclerotic bone lesions show increased sclerosis compared to recent PET-CT. This is likely due to healing/ treated bone metastases, with progression of bone metastases considered less likely. Stable benign right adrenal adenoma. Electronically Signed   By: Earle Gell M.D.   On: 05/01/2016  09:26    Impression/Plan: 1. 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 The patient has done very well since completion of her radiotherapy. She continues on systemic therapy with Dr. Julien Nordmann, and will complete a total of 6 cycles of chemotherpay prior to re-staging scans. Dr. Julien Nordmann will send her back for further discussion when the time is appropriate to consider PCI.  2. Possible role of PCI. Again we discussed that she had a negative brain MRI scan prior to initiating treatment, she is currently undergoing chemotherapy with Dr. Julien Nordmann, and we discussed the following this, if she has stability of disease, we would consider offering prophylactic cranial irradiation to take her risk of brain metastases of up to 60% down to 20%. We did also discussed that prophylactic cranial irradiation does improve survival by about 5%. She would be interested in moving forward if this is appropriate that time, she also does understand the risk of needing treatment level dosing of whole brain radiation if she were to develop brain metastases.    Carola Rhine, PAC

## 2016-05-25 ENCOUNTER — Ambulatory Visit (HOSPITAL_BASED_OUTPATIENT_CLINIC_OR_DEPARTMENT_OTHER): Payer: No Typology Code available for payment source

## 2016-05-25 ENCOUNTER — Other Ambulatory Visit: Payer: Self-pay | Admitting: *Deleted

## 2016-05-25 ENCOUNTER — Encounter: Payer: Self-pay | Admitting: Medical Oncology

## 2016-05-25 ENCOUNTER — Other Ambulatory Visit (HOSPITAL_BASED_OUTPATIENT_CLINIC_OR_DEPARTMENT_OTHER): Payer: No Typology Code available for payment source

## 2016-05-25 ENCOUNTER — Encounter: Payer: Self-pay | Admitting: Internal Medicine

## 2016-05-25 ENCOUNTER — Ambulatory Visit (HOSPITAL_BASED_OUTPATIENT_CLINIC_OR_DEPARTMENT_OTHER): Payer: No Typology Code available for payment source | Admitting: Internal Medicine

## 2016-05-25 DIAGNOSIS — C3491 Malignant neoplasm of unspecified part of right bronchus or lung: Secondary | ICD-10-CM

## 2016-05-25 DIAGNOSIS — C3411 Malignant neoplasm of upper lobe, right bronchus or lung: Secondary | ICD-10-CM

## 2016-05-25 DIAGNOSIS — C7951 Secondary malignant neoplasm of bone: Secondary | ICD-10-CM

## 2016-05-25 DIAGNOSIS — I10 Essential (primary) hypertension: Secondary | ICD-10-CM | POA: Diagnosis not present

## 2016-05-25 DIAGNOSIS — Z5111 Encounter for antineoplastic chemotherapy: Secondary | ICD-10-CM

## 2016-05-25 LAB — CBC WITH DIFFERENTIAL/PLATELET
BASO%: 0.3 % (ref 0.0–2.0)
BASOS ABS: 0 10*3/uL (ref 0.0–0.1)
EOS%: 0.6 % (ref 0.0–7.0)
Eosinophils Absolute: 0 10*3/uL (ref 0.0–0.5)
HEMATOCRIT: 34.5 % — AB (ref 34.8–46.6)
HEMOGLOBIN: 11.4 g/dL — AB (ref 11.6–15.9)
LYMPH#: 1.3 10*3/uL (ref 0.9–3.3)
LYMPH%: 17.6 % (ref 14.0–49.7)
MCH: 30.2 pg (ref 25.1–34.0)
MCHC: 33 g/dL (ref 31.5–36.0)
MCV: 91.3 fL (ref 79.5–101.0)
MONO#: 0.9 10*3/uL (ref 0.1–0.9)
MONO%: 12.7 % (ref 0.0–14.0)
NEUT#: 5 10*3/uL (ref 1.5–6.5)
NEUT%: 68.8 % (ref 38.4–76.8)
PLATELETS: 253 10*3/uL (ref 145–400)
RBC: 3.78 10*6/uL (ref 3.70–5.45)
RDW: 18 % — AB (ref 11.2–14.5)
WBC: 7.3 10*3/uL (ref 3.9–10.3)

## 2016-05-25 LAB — COMPREHENSIVE METABOLIC PANEL
ALBUMIN: 3.6 g/dL (ref 3.5–5.0)
ALK PHOS: 92 U/L (ref 40–150)
ALT: 26 U/L (ref 0–55)
ANION GAP: 9 meq/L (ref 3–11)
AST: 15 U/L (ref 5–34)
BUN: 7.5 mg/dL (ref 7.0–26.0)
CALCIUM: 9.5 mg/dL (ref 8.4–10.4)
CO2: 28 mEq/L (ref 22–29)
CREATININE: 0.7 mg/dL (ref 0.6–1.1)
Chloride: 98 mEq/L (ref 98–109)
EGFR: >90 mL/min/{1.73_m2} (ref >90)
Glucose: 108 mg/dl (ref 70–140)
Potassium: 3.9 mEq/L (ref 3.5–5.1)
Sodium: 136 mEq/L (ref 136–145)
Total Bilirubin: 0.29 mg/dL (ref 0.20–1.20)
Total Protein: 7 g/dL (ref 6.4–8.3)

## 2016-05-25 MED ORDER — BENZONATATE 100 MG PO CAPS: 100.0000 mg | ORAL_CAPSULE | Freq: Three times a day (TID) | ORAL | 0 refills | Status: DC | PRN

## 2016-05-25 MED ORDER — DEXAMETHASONE SODIUM PHOSPHATE 10 MG/ML IJ SOLN
10.0000 mg | Freq: Once | INTRAMUSCULAR | Status: AC
  Administered 2016-05-25: 10 mg via INTRAVENOUS

## 2016-05-25 MED ORDER — PALONOSETRON HCL INJECTION 0.25 MG/5ML
INTRAVENOUS | Status: AC
  Filled 2016-05-25: qty 5

## 2016-05-25 MED ORDER — SODIUM CHLORIDE 0.9 % IV SOLN
503.0000 mg | Freq: Once | INTRAVENOUS | Status: AC
  Administered 2016-05-25: 500 mg via INTRAVENOUS
  Filled 2016-05-25: qty 50

## 2016-05-25 MED ORDER — DEXAMETHASONE SODIUM PHOSPHATE 10 MG/ML IJ SOLN
INTRAMUSCULAR | Status: AC
  Filled 2016-05-25: qty 1

## 2016-05-25 MED ORDER — PALONOSETRON HCL INJECTION 0.25 MG/5ML
0.2500 mg | Freq: Once | INTRAVENOUS | Status: AC
  Administered 2016-05-25: 0.25 mg via INTRAVENOUS

## 2016-05-25 MED ORDER — SODIUM CHLORIDE 0.9 % IV SOLN
Freq: Once | INTRAVENOUS | Status: AC
  Administered 2016-05-25: 13:00:00 via INTRAVENOUS

## 2016-05-25 MED ORDER — ETOPOSIDE CHEMO INJECTION 1 GM/50ML
100.0000 mg/m2 | Freq: Once | INTRAVENOUS | Status: AC
  Administered 2016-05-25: 170 mg via INTRAVENOUS
  Filled 2016-05-25: qty 8.5

## 2016-05-25 NOTE — Progress Notes (Signed)
[place] [phone]   [phone]  OFFICE PROGRESS NOTE  [name], PA-C [place] [address]  DIAGNOSIS: Extensive stage (T1b, N3, M1 B) small cell lung cancer presented with right upper lobe lung mass, massive mediastinal lymphadenopathy and metastatic bone lesions diagnosed in October 2017  PRIOR THERAPY: Palliative radiotherapy to the large mediastinal mass under the care of Dr. Tammi Klippel.  CURRENT THERAPY: Systemic chemotherapy with carboplatin for AUC of 5 on day 1 and etoposide 100 MG/M2 on days 1, 2 and 3 with Neulasta support on day 4. Status post 3 cycles.  INTERVAL HISTORY: [name] 65 y.o. female returns to the clinic today for follow-up visit accompanied by her son and interpreter. The patient is currently undergoing systemic chemotherapy with carboplatin and etoposide and tolerating her treatment well. She is status post 3 cycles of chemotherapy. She has very mild nausea with the treatment. She has mild alopecia. She continues to have mild pain on the right upper anterior chest wall. She denied having any fever or chills. She has no shortness of breath but continues to have mild dry cough with no hemoptysis. She is requesting refill of her cough medication. She is here today for evaluation before starting cycle #4 of her chemotherapy.  MEDICAL HISTORY: Past Medical History:  Diagnosis Date  . Anemia    in the past  . Arthritis   . Depression   . Encounter for antineoplastic chemotherapy 03/30/2016  . Headache   . Hyperlipidemia   . Hypertension   . Lung mass 03/12/2016  . PONV (postoperative nausea and vomiting)    after hysterectomy  . Shortness of breath dyspnea    with exertion    ALLERGIES:  is allergic to penicillins.  MEDICATIONS:  Current Outpatient Prescriptions  Medication Sig Dispense Refill  . atorvastatin (LIPITOR) 40 MG tablet Take 40 mg by mouth at bedtime.   0  . benzonatate  (TESSALON) 100 MG capsule Take 1-2 capsules (100-200 mg total) by mouth 3 (three) times daily as needed for cough. 40 capsule 0  . hydrochlorothiazide (HYDRODIURIL) 25 MG tablet TAKE 1 TABLET BY MOUTH DAILY 30 tablet 0  . prochlorperazine (COMPAZINE) 10 MG tablet Take 1 tablet (10 mg total) by mouth every 6 (six) hours as needed for nausea or vomiting. 30 tablet 0  . temazepam (RESTORIL) 30 MG capsule Take 1 capsule (30 mg total) by mouth at bedtime as needed for sleep. 30 capsule 0   No current facility-administered medications for this visit.     SURGICAL HISTORY:  Past Surgical History:  Procedure Laterality Date  . ABDOMINAL HYSTERECTOMY     2009 , due to fobroids, benign path for uterus, tumor and also cervix.   [facility] BACK SURGERY     lower back  . COLONOSCOPY    . VIDEO BRONCHOSCOPY WITH ENDOBRONCHIAL ULTRASOUND N/A 03/15/2016   Procedure: VIDEO BRONCHOSCOPY WITH ENDOBRONCHIAL ULTRASOUND AND BIOPSY OF LEVEL SEVEN LYMPH NODE;  Surgeon: Grace Isaac, MD;  Location: [facility];  Service: Thoracic;  Laterality: N/A;    REVIEW OF SYSTEMS:  Constitutional: positive for fatigue Eyes: negative Ears, nose, mouth, throat, and face: negative Respiratory: positive for cough and pleurisy/chest pain Cardiovascular: negative Gastrointestinal: positive for nausea Genitourinary:negative Integument/breast: negative Hematologic/lymphatic: negative Musculoskeletal:negative Neurological: negative Behavioral/Psych: negative Endocrine: negative Allergic/Immunologic: negative   PHYSICAL EXAMINATION: General appearance: alert, cooperative, fatigued and no distress Head: Normocephalic, without obvious abnormality, atraumatic Neck: no adenopathy, no JVD, supple,  symmetrical, trachea midline and thyroid not enlarged, symmetric, no tenderness/mass/nodules Lymph nodes: Cervical, supraclavicular, and axillary nodes normal. Resp: clear to auscultation bilaterally Back: symmetric, no curvature. ROM normal.  No CVA tenderness. Cardio: regular rate and rhythm, S1, S2 normal, no murmur, click, rub or gallop GI: soft, non-tender; bowel sounds normal; no masses,  no organomegaly Extremities: extremities normal, atraumatic, no cyanosis or edema Neurologic: Alert and oriented X 3, normal strength and tone. Normal symmetric reflexes. Normal coordination and gait  ECOG PERFORMANCE STATUS: 1 - Symptomatic but completely ambulatory  Blood pressure 117/71, pulse 79, temperature 97.7 F (36.5 C), temperature source Oral, resp. rate 18, height '5\' 2"'$  (1.575 m), weight 154 lb 14.4 oz (70.3 kg).  LABORATORY DATA: Lab Results  Component Value Date   WBC 7.3 05/25/2016   HGB 11.4 (L) 05/25/2016   HCT 34.5 (L) 05/25/2016   MCV 91.3 05/25/2016   PLT 253 05/25/2016      Chemistry      Component Value Date/Time   NA 136 05/25/2016 1112   K 3.9 05/25/2016 1112   CL 98 (L) 03/15/2016 1325   CO2 28 05/25/2016 1112   BUN 7.5 05/25/2016 1112   CREATININE 0.7 05/25/2016 1112      Component Value Date/Time   CALCIUM 9.5 05/25/2016 1112   ALKPHOS 92 05/25/2016 1112   AST 15 05/25/2016 1112   ALT 26 05/25/2016 1112   BILITOT 0.29 05/25/2016 1112       RADIOGRAPHIC STUDIES: Ct Chest W Contrast  Result Date: 05/01/2016 CLINICAL DATA:  Right lung small cell carcinoma. Undergoing chemotherapy and radiation therapy. Restaging. EXAM: CT CHEST, ABDOMEN, AND PELVIS WITH CONTRAST TECHNIQUE: Multidetector CT imaging of the chest, abdomen and pelvis was performed following the standard protocol during bolus administration of intravenous contrast. CONTRAST:  134m ISOVUE-300 IOPAMIDOL (ISOVUE-300) INJECTION 61% COMPARISON:  PET-CT on 03/26/2016 and abdomen MRI on 03/03/2012 FINDINGS: CT CHEST FINDINGS Cardiovascular: No acute findings. Aortic atherosclerosis. Mild coronary artery calcification also noted. Previously seen small pericardial effusion has resolved. Mediastinum/Nodes: Previously seen mild right  supraclavicular lymphadenopathy has resolved. There has been near complete resolution of mediastinal lymphadenopathy since previous study, with largest residual area in the right paratracheal region measuring 1.9 cm in short axis on image 18/2 compared to 4.4 cm when measured at the same location on previous study. Decreased right suprahilar lymphadenopathy is also seen, currently measuring 1.2 cm on image 24/2 compared to 1.7 cm previously. No new or increased lymphadenopathy identified. Lungs/Pleura: Central right upper lobe pulmonary nodule has decreased in size, currently measuring 1.3 x 0.7 cm on image 60/7 compared to 1.8 x 1.1 cm previously. No other pulmonary nodules or masses identified. No evidence of pleural effusion. Musculoskeletal: Subtle small sclerotic bone lesions in the thoracic spine show increased sclerosis since previous study. This is likely due to healing/treated bone metastases, with progression of bone metastases considered less likely. CT ABDOMEN AND PELVIS FINDINGS Hepatobiliary: No masses identified. Tiny sub-cm low-attenuation lesion in the posterior right hepatic lobe remains stable since 2013 MRI, consistent with tiny benign cyst. Gallbladder is unremarkable. Pancreas:  No mass or inflammatory changes. Spleen:  Within normal limits in size and appearance. Adrenals/Urinary tract: Stable 3.7 cm low-attenuation benign right adrenal adenoma. Left adrenal gland is normal in appearance. Both kidneys are normal in appearance. No evidence hydronephrosis. Unopacified urinary bladder is unremarkable in appearance. Stomach/Bowel: No evidence of obstruction, inflammatory process, or abnormal fluid collections. Vascular/Lymphatic: No pathologically enlarged lymph nodes identified. No abdominal aortic aneurysm.  Aortic atherosclerosis. Reproductive: Prior hysterectomy noted. Adnexal regions are unremarkable in appearance. Other:  None. Musculoskeletal: Subtle sclerotic bone lesions are again seen  throughout the spine and pelvis. Several of these lesions show increased sclerosis since previous study. This is likely due to healing/treated bone metastases, with progression of bone metastases considered less likely. IMPRESSION: Near complete resolution of mediastinal and right hilar lymphadenopathy since prior study. Interval resolution of mild right supraclavicular lymphadenopathy. Decreased size of central right upper lobe pulmonary nodule. No evidence of soft tissue metastatic disease within the abdomen or pelvis. Subtle diffuse sclerotic bone lesions show increased sclerosis compared to recent PET-CT. This is likely due to healing/ treated bone metastases, with progression of bone metastases considered less likely. Stable benign right adrenal adenoma. Electronically Signed   By: Earle Gell M.D.   On: 05/01/2016 09:26   Ct Abdomen Pelvis W Contrast  Result Date: 05/01/2016 CLINICAL DATA:  Right lung small cell carcinoma. Undergoing chemotherapy and radiation therapy. Restaging. EXAM: CT CHEST, ABDOMEN, AND PELVIS WITH CONTRAST TECHNIQUE: Multidetector CT imaging of the chest, abdomen and pelvis was performed following the standard protocol during bolus administration of intravenous contrast. CONTRAST:  125m ISOVUE-300 IOPAMIDOL (ISOVUE-300) INJECTION 61% COMPARISON:  PET-CT on 03/26/2016 and abdomen MRI on 03/03/2012 FINDINGS: CT CHEST FINDINGS Cardiovascular: No acute findings. Aortic atherosclerosis. Mild coronary artery calcification also noted. Previously seen small pericardial effusion has resolved. Mediastinum/Nodes: Previously seen mild right supraclavicular lymphadenopathy has resolved. There has been near complete resolution of mediastinal lymphadenopathy since previous study, with largest residual area in the right paratracheal region measuring 1.9 cm in short axis on image 18/2 compared to 4.4 cm when measured at the same location on previous study. Decreased right suprahilar lymphadenopathy  is also seen, currently measuring 1.2 cm on image 24/2 compared to 1.7 cm previously. No new or increased lymphadenopathy identified. Lungs/Pleura: Central right upper lobe pulmonary nodule has decreased in size, currently measuring 1.3 x 0.7 cm on image 60/7 compared to 1.8 x 1.1 cm previously. No other pulmonary nodules or masses identified. No evidence of pleural effusion. Musculoskeletal: Subtle small sclerotic bone lesions in the thoracic spine show increased sclerosis since previous study. This is likely due to healing/treated bone metastases, with progression of bone metastases considered less likely. CT ABDOMEN AND PELVIS FINDINGS Hepatobiliary: No masses identified. Tiny sub-cm low-attenuation lesion in the posterior right hepatic lobe remains stable since 2013 MRI, consistent with tiny benign cyst. Gallbladder is unremarkable. Pancreas:  No mass or inflammatory changes. Spleen:  Within normal limits in size and appearance. Adrenals/Urinary tract: Stable 3.7 cm low-attenuation benign right adrenal adenoma. Left adrenal gland is normal in appearance. Both kidneys are normal in appearance. No evidence hydronephrosis. Unopacified urinary bladder is unremarkable in appearance. Stomach/Bowel: No evidence of obstruction, inflammatory process, or abnormal fluid collections. Vascular/Lymphatic: No pathologically enlarged lymph nodes identified. No abdominal aortic aneurysm. Aortic atherosclerosis. Reproductive: Prior hysterectomy noted. Adnexal regions are unremarkable in appearance. Other:  None. Musculoskeletal: Subtle sclerotic bone lesions are again seen throughout the spine and pelvis. Several of these lesions show increased sclerosis since previous study. This is likely due to healing/treated bone metastases, with progression of bone metastases considered less likely. IMPRESSION: Near complete resolution of mediastinal and right hilar lymphadenopathy since prior study. Interval resolution of mild right  supraclavicular lymphadenopathy. Decreased size of central right upper lobe pulmonary nodule. No evidence of soft tissue metastatic disease within the abdomen or pelvis. Subtle diffuse sclerotic bone lesions show increased sclerosis compared to recent PET-CT.  This is likely due to healing/ treated bone metastases, with progression of bone metastases considered less likely. Stable benign right adrenal adenoma. Electronically Signed   By: Earle Gell M.D.   On: 05/01/2016 09:26    ASSESSMENT AND PLAN: This is a very pleasant 65 years old white female with extensive stage small cell lung cancer. She is currently undergoing systemic chemotherapy with carboplatin and etoposide status post 3 cycles. She is tolerating the treatment well except for mild nausea. I recommended for the patient to proceed with cycle #4 today as scheduled. I discussed with the patient the option of enrollment in a maintenance clinical trial with ROVA-T after completion of cycle #4 if she has no evidence for disease progression. I discussed with the patient briefly the principles of the trial. She would have repeat CT scan of the chest, abdomen and pelvis in 3 weeks after completion of cycle #4. For the dry cough, I gave the patient refill of Tessalon. The patient was advised to call immediately if she has any concerning symptoms in the interval. The patient voices understanding of current disease status and treatment options and is in agreement with the current care plan.  All questions were answered. The patient knows to call the clinic with any problems, questions or concerns. We can certainly see the patient much sooner if necessary.  I spent 15 minutes counseling the patient face to face. The total time spent in the appointment was 25 minutes.  Disclaimer: This note was dictated with voice recognition software. Similar sounding words can inadvertently be transcribed and may not be corrected upon review.

## 2016-05-25 NOTE — Patient Instructions (Signed)
[facility] Discharge Instructions for Patients Receiving Chemotherapy  Today you received the following chemotherapy agents: carboplatin, Etoposide  To help prevent nausea and vomiting after your treatment, we encourage you to take your nausea medication as prescribed.  If you develop nausea and vomiting that is not controlled by your nausea medication, call the clinic.   BELOW ARE SYMPTOMS THAT SHOULD BE REPORTED IMMEDIATELY:  *FEVER GREATER THAN 100.5 F  *CHILLS WITH OR WITHOUT FEVER  NAUSEA AND VOMITING THAT IS NOT CONTROLLED WITH YOUR NAUSEA MEDICATION  *UNUSUAL SHORTNESS OF BREATH  *UNUSUAL BRUISING OR BLEEDING  TENDERNESS IN MOUTH AND THROAT WITH OR WITHOUT PRESENCE OF ULCERS  *URINARY PROBLEMS  *BOWEL PROBLEMS  UNUSUAL RASH Items with * indicate a potential emergency and should be followed up as soon as possible.  Feel free to call the clinic you have any questions or concerns. The clinic phone number is [phone].  Please show the [facility] at check-in to the Emergency Department and triage nurse.

## 2016-05-26 ENCOUNTER — Ambulatory Visit (HOSPITAL_BASED_OUTPATIENT_CLINIC_OR_DEPARTMENT_OTHER): Payer: No Typology Code available for payment source

## 2016-05-26 VITALS — BP 110/72 | HR 99 | Temp 98.4°F | Resp 18

## 2016-05-26 DIAGNOSIS — C7951 Secondary malignant neoplasm of bone: Secondary | ICD-10-CM

## 2016-05-26 DIAGNOSIS — C3411 Malignant neoplasm of upper lobe, right bronchus or lung: Secondary | ICD-10-CM

## 2016-05-26 DIAGNOSIS — Z5111 Encounter for antineoplastic chemotherapy: Secondary | ICD-10-CM

## 2016-05-26 DIAGNOSIS — C3491 Malignant neoplasm of unspecified part of right bronchus or lung: Secondary | ICD-10-CM

## 2016-05-26 MED ORDER — DEXAMETHASONE SODIUM PHOSPHATE 10 MG/ML IJ SOLN
INTRAMUSCULAR | Status: AC
  Filled 2016-05-26: qty 1

## 2016-05-26 MED ORDER — SODIUM CHLORIDE 0.9 % IV SOLN
100.0000 mg/m2 | Freq: Once | INTRAVENOUS | Status: AC
  Administered 2016-05-26: 170 mg via INTRAVENOUS
  Filled 2016-05-26: qty 8.5

## 2016-05-26 MED ORDER — DEXAMETHASONE SODIUM PHOSPHATE 10 MG/ML IJ SOLN
10.0000 mg | Freq: Once | INTRAMUSCULAR | Status: AC
  Administered 2016-05-26: 10 mg via INTRAVENOUS

## 2016-05-26 MED ORDER — SODIUM CHLORIDE 0.9 % IV SOLN
Freq: Once | INTRAVENOUS | Status: AC
  Administered 2016-05-26: 14:00:00 via INTRAVENOUS

## 2016-05-26 NOTE — Patient Instructions (Signed)
[facility] Discharge Instructions for Patients Receiving Chemotherapy  Today you received the following chemotherapy agents Etoposide.   To help prevent nausea and vomiting after your treatment, we encourage you to take your nausea medication as prescribed.   If you develop nausea and vomiting that is not controlled by your nausea medication, call the clinic.   BELOW ARE SYMPTOMS THAT SHOULD BE REPORTED IMMEDIATELY:  *FEVER GREATER THAN 100.5 F  *CHILLS WITH OR WITHOUT FEVER  NAUSEA AND VOMITING THAT IS NOT CONTROLLED WITH YOUR NAUSEA MEDICATION  *UNUSUAL SHORTNESS OF BREATH  *UNUSUAL BRUISING OR BLEEDING  TENDERNESS IN MOUTH AND THROAT WITH OR WITHOUT PRESENCE OF ULCERS  *URINARY PROBLEMS  *BOWEL PROBLEMS  UNUSUAL RASH Items with * indicate a potential emergency and should be followed up as soon as possible.  Feel free to call the clinic you have any questions or concerns. The clinic phone number is [phone].  Please show the CHEMO ALERT CARD at check-in to the Emergency Department and triage nurse.   

## 2016-05-27 ENCOUNTER — Ambulatory Visit (HOSPITAL_BASED_OUTPATIENT_CLINIC_OR_DEPARTMENT_OTHER): Payer: No Typology Code available for payment source

## 2016-05-27 VITALS — BP 107/53 | HR 98 | Temp 98.7°F | Resp 18

## 2016-05-27 DIAGNOSIS — Z5111 Encounter for antineoplastic chemotherapy: Secondary | ICD-10-CM

## 2016-05-27 DIAGNOSIS — C3491 Malignant neoplasm of unspecified part of right bronchus or lung: Secondary | ICD-10-CM

## 2016-05-27 DIAGNOSIS — C3411 Malignant neoplasm of upper lobe, right bronchus or lung: Secondary | ICD-10-CM

## 2016-05-27 MED ORDER — DEXAMETHASONE SODIUM PHOSPHATE 10 MG/ML IJ SOLN
10.0000 mg | Freq: Once | INTRAMUSCULAR | Status: AC
  Administered 2016-05-27: 10 mg via INTRAVENOUS

## 2016-05-27 MED ORDER — DEXAMETHASONE SODIUM PHOSPHATE 10 MG/ML IJ SOLN
INTRAMUSCULAR | Status: AC
  Filled 2016-05-27: qty 1

## 2016-05-27 MED ORDER — SODIUM CHLORIDE 0.9 % IV SOLN
100.0000 mg/m2 | Freq: Once | INTRAVENOUS | Status: AC
  Administered 2016-05-27: 170 mg via INTRAVENOUS
  Filled 2016-05-27: qty 8.5

## 2016-05-27 MED ORDER — SODIUM CHLORIDE 0.9 % IV SOLN
Freq: Once | INTRAVENOUS | Status: AC
  Administered 2016-05-27: 14:00:00 via INTRAVENOUS

## 2016-05-27 NOTE — Patient Instructions (Signed)
[facility] Discharge Instructions for Patients Receiving Chemotherapy  Today you received the following chemotherapy agents Etoposide.   To help prevent nausea and vomiting after your treatment, we encourage you to take your nausea medication as prescribed.   If you develop nausea and vomiting that is not controlled by your nausea medication, call the clinic.   BELOW ARE SYMPTOMS THAT SHOULD BE REPORTED IMMEDIATELY:  *FEVER GREATER THAN 100.5 F  *CHILLS WITH OR WITHOUT FEVER  NAUSEA AND VOMITING THAT IS NOT CONTROLLED WITH YOUR NAUSEA MEDICATION  *UNUSUAL SHORTNESS OF BREATH  *UNUSUAL BRUISING OR BLEEDING  TENDERNESS IN MOUTH AND THROAT WITH OR WITHOUT PRESENCE OF ULCERS  *URINARY PROBLEMS  *BOWEL PROBLEMS  UNUSUAL RASH Items with * indicate a potential emergency and should be followed up as soon as possible.  Feel free to call the clinic you have any questions or concerns. The clinic phone number is [phone].  Please show the CHEMO ALERT CARD at check-in to the Emergency Department and triage nurse.   

## 2016-05-29 ENCOUNTER — Ambulatory Visit (HOSPITAL_BASED_OUTPATIENT_CLINIC_OR_DEPARTMENT_OTHER): Payer: No Typology Code available for payment source

## 2016-05-29 VITALS — BP 120/73 | HR 95 | Temp 98.0°F | Resp 18

## 2016-05-29 DIAGNOSIS — C3411 Malignant neoplasm of upper lobe, right bronchus or lung: Secondary | ICD-10-CM | POA: Diagnosis not present

## 2016-05-29 DIAGNOSIS — C3491 Malignant neoplasm of unspecified part of right bronchus or lung: Secondary | ICD-10-CM

## 2016-05-29 MED ORDER — PEGFILGRASTIM INJECTION 6 MG/0.6ML ~~LOC~~
6.0000 mg | PREFILLED_SYRINGE | Freq: Once | SUBCUTANEOUS | Status: AC
  Administered 2016-05-29: 6 mg via SUBCUTANEOUS

## 2016-05-29 NOTE — Patient Instructions (Signed)
Pegfilgrastim injection What is this medicine? PEGFILGRASTIM (PEG fil gra stim) is a long-acting granulocyte colony-stimulating factor that stimulates the growth of neutrophils, a type of white blood cell important in the body's fight against infection. It is used to reduce the incidence of fever and infection in patients with certain types of cancer who are receiving chemotherapy that affects the bone marrow, and to increase survival after being exposed to high doses of radiation. This medicine may be used for other purposes; ask your health care provider or pharmacist if you have questions. COMMON BRAND NAME(S): Neulasta What should I tell my health care provider before I take this medicine? They need to know if you have any of these conditions: -kidney disease -latex allergy -ongoing radiation therapy -sickle cell disease -skin reactions to acrylic adhesives (On-Body Injector only) -an unusual or allergic reaction to pegfilgrastim, filgrastim, other medicines, foods, dyes, or preservatives -pregnant or trying to get pregnant -breast-feeding How should I use this medicine? This medicine is for injection under the skin. If you get this medicine at home, you will be taught how to prepare and give the pre-filled syringe or how to use the On-body Injector. Refer to the patient Instructions for Use for detailed instructions. Use exactly as directed. Take your medicine at regular intervals. Do not take your medicine more often than directed. It is important that you put your used needles and syringes in a special sharps container. Do not put them in a trash can. If you do not have a sharps container, call your pharmacist or healthcare provider to get one. Talk to your pediatrician regarding the use of this medicine in children. While this drug may be prescribed for selected conditions, precautions do apply. Overdosage: If you think you have taken too much of this medicine contact a poison control  center or emergency room at once. NOTE: This medicine is only for you. Do not share this medicine with others. What if I miss a dose? It is important not to miss your dose. Call your doctor or health care professional if you miss your dose. If you miss a dose due to an On-body Injector failure or leakage, a new dose should be administered as soon as possible using a single prefilled syringe for manual use. What may interact with this medicine? Interactions have not been studied. Give your health care provider a list of all the medicines, herbs, non-prescription drugs, or dietary supplements you use. Also tell them if you smoke, drink alcohol, or use illegal drugs. Some items may interact with your medicine. This list may not describe all possible interactions. Give your health care provider a list of all the medicines, herbs, non-prescription drugs, or dietary supplements you use. Also tell them if you smoke, drink alcohol, or use illegal drugs. Some items may interact with your medicine. What should I watch for while using this medicine? You may need blood work done while you are taking this medicine. If you are going to need a MRI, CT scan, or other procedure, tell your doctor that you are using this medicine (On-Body Injector only). What side effects may I notice from receiving this medicine? Side effects that you should report to your doctor or health care professional as soon as possible: -allergic reactions like skin rash, itching or hives, swelling of the face, lips, or tongue -dizziness -fever -pain, redness, or irritation at site where injected -pinpoint red spots on the skin -red or dark-brown urine -shortness of breath or breathing problems -stomach or   side pain, or pain at the shoulder -swelling -tiredness -trouble passing urine or change in the amount of urine Side effects that usually do not require medical attention (report to your doctor or health care professional if they  continue or are bothersome): -bone pain -muscle pain This list may not describe all possible side effects. Call your doctor for medical advice about side effects. You may report side effects to FDA at 1-800-FDA-1088. Where should I keep my medicine? Keep out of the reach of children. Store pre-filled syringes in a refrigerator between 2 and 8 degrees C (36 and 46 degrees F). Do not freeze. Keep in carton to protect from light. Throw away this medicine if it is left out of the refrigerator for more than 48 hours. Throw away any unused medicine after the expiration date. NOTE: This sheet is a summary. It may not cover all possible information. If you have questions about this medicine, talk to your doctor, pharmacist, or health care provider.  2017 Elsevier/Gold Standard (2014-06-20 14:30:14)  

## 2016-05-31 ENCOUNTER — Telehealth: Payer: Self-pay | Admitting: Family Medicine

## 2016-05-31 MED ORDER — HYDROCHLOROTHIAZIDE 25 MG PO TABS: 25.0000 mg | ORAL_TABLET | Freq: Every day | ORAL | 1 refills | Status: DC

## 2016-05-31 NOTE — Telephone Encounter (Addendum)
[name] patient's husband today, her son accompanied him. Has appt on Fri - needs refill on hctz to last until then. Reviewed chart. Patient is currently being followed very closely by oncology for her lung cancer. Recent BMP normal and blood pressure is at goal so refilled HCTZ 6 months. She did not need anything else so okay to cancel scheduled appointment and recheck in 6 months.

## 2016-06-01 ENCOUNTER — Other Ambulatory Visit (HOSPITAL_BASED_OUTPATIENT_CLINIC_OR_DEPARTMENT_OTHER): Payer: No Typology Code available for payment source

## 2016-06-01 DIAGNOSIS — C3411 Malignant neoplasm of upper lobe, right bronchus or lung: Secondary | ICD-10-CM

## 2016-06-01 DIAGNOSIS — C3491 Malignant neoplasm of unspecified part of right bronchus or lung: Secondary | ICD-10-CM

## 2016-06-01 LAB — COMPREHENSIVE METABOLIC PANEL
ALBUMIN: 3.5 g/dL (ref 3.5–5.0)
ALK PHOS: 137 U/L (ref 40–150)
ALT: 22 U/L (ref 0–55)
AST: 13 U/L (ref 5–34)
Anion Gap: 8 mEq/L (ref 3–11)
BUN: 11.5 mg/dL (ref 7.0–26.0)
CO2: 27 meq/L (ref 22–29)
Calcium: 9.2 mg/dL (ref 8.4–10.4)
Chloride: 101 mEq/L (ref 98–109)
Creatinine: 0.6 mg/dL (ref 0.6–1.1)
GLUCOSE: 114 mg/dL (ref 70–140)
Potassium: 3.7 mEq/L (ref 3.5–5.1)
SODIUM: 136 meq/L (ref 136–145)
TOTAL PROTEIN: 6.7 g/dL (ref 6.4–8.3)

## 2016-06-01 LAB — CBC WITH DIFFERENTIAL/PLATELET
BASO%: 0.5 % (ref 0.0–2.0)
Basophils Absolute: 0.1 10*3/uL (ref 0.0–0.1)
EOS ABS: 0.1 10*3/uL (ref 0.0–0.5)
EOS%: 0.3 % (ref 0.0–7.0)
HCT: 30.5 % — ABNORMAL LOW (ref 34.8–46.6)
HEMOGLOBIN: 10.1 g/dL — AB (ref 11.6–15.9)
LYMPH%: 6.8 % — AB (ref 14.0–49.7)
MCH: 30.6 pg (ref 25.1–34.0)
MCHC: 33.2 g/dL (ref 31.5–36.0)
MCV: 92.1 fL (ref 79.5–101.0)
MONO#: 1.2 10*3/uL — ABNORMAL HIGH (ref 0.1–0.9)
MONO%: 5.4 % (ref 0.0–14.0)
NEUT%: 87 % — ABNORMAL HIGH (ref 38.4–76.8)
NEUTROS ABS: 19 10*3/uL — AB (ref 1.5–6.5)
Platelets: 243 10*3/uL (ref 145–400)
RBC: 3.31 10*6/uL — AB (ref 3.70–5.45)
RDW: 18.8 % — AB (ref 11.2–14.5)
WBC: 21.8 10*3/uL — AB (ref 3.9–10.3)
lymph#: 1.5 10*3/uL (ref 0.9–3.3)

## 2016-06-04 ENCOUNTER — Ambulatory Visit: Payer: No Typology Code available for payment source

## 2016-06-08 ENCOUNTER — Encounter (HOSPITAL_COMMUNITY): Payer: Self-pay

## 2016-06-08 ENCOUNTER — Ambulatory Visit (HOSPITAL_COMMUNITY)
Admission: RE | Admit: 2016-06-08 | Discharge: 2016-06-08 | Disposition: A | Discharge status: Home / Self Care | Payer: No Typology Code available for payment source | Source: Ambulatory Visit | Attending: Internal Medicine | Admitting: Internal Medicine

## 2016-06-08 ENCOUNTER — Other Ambulatory Visit (HOSPITAL_BASED_OUTPATIENT_CLINIC_OR_DEPARTMENT_OTHER): Payer: No Typology Code available for payment source

## 2016-06-08 DIAGNOSIS — D3501 Benign neoplasm of right adrenal gland: Secondary | ICD-10-CM | POA: Diagnosis not present

## 2016-06-08 DIAGNOSIS — Z5111 Encounter for antineoplastic chemotherapy: Secondary | ICD-10-CM | POA: Diagnosis not present

## 2016-06-08 DIAGNOSIS — R590 Localized enlarged lymph nodes: Secondary | ICD-10-CM | POA: Insufficient documentation

## 2016-06-08 DIAGNOSIS — C3491 Malignant neoplasm of unspecified part of right bronchus or lung: Secondary | ICD-10-CM | POA: Insufficient documentation

## 2016-06-08 DIAGNOSIS — C3411 Malignant neoplasm of upper lobe, right bronchus or lung: Secondary | ICD-10-CM

## 2016-06-08 DIAGNOSIS — C7951 Secondary malignant neoplasm of bone: Secondary | ICD-10-CM | POA: Diagnosis not present

## 2016-06-08 LAB — COMPREHENSIVE METABOLIC PANEL
ALT: 32 U/L (ref 0–55)
ANION GAP: 10 meq/L (ref 3–11)
AST: 18 U/L (ref 5–34)
Albumin: 4 g/dL (ref 3.5–5.0)
Alkaline Phosphatase: 118 U/L (ref 40–150)
BUN: 7 mg/dL (ref 7.0–26.0)
CHLORIDE: 101 meq/L (ref 98–109)
CO2: 27 meq/L (ref 22–29)
Calcium: 9.6 mg/dL (ref 8.4–10.4)
Creatinine: 0.7 mg/dL (ref 0.6–1.1)
GLUCOSE: 103 mg/dL (ref 70–140)
POTASSIUM: 3.6 meq/L (ref 3.5–5.1)
SODIUM: 138 meq/L (ref 136–145)
TOTAL PROTEIN: 7.1 g/dL (ref 6.4–8.3)

## 2016-06-08 LAB — CBC WITH DIFFERENTIAL/PLATELET
BASO%: 0.2 % (ref 0.0–2.0)
Basophils Absolute: 0 10*3/uL (ref 0.0–0.1)
EOS ABS: 0 10*3/uL (ref 0.0–0.5)
EOS%: 0.1 % (ref 0.0–7.0)
HCT: 33.7 % — ABNORMAL LOW (ref 34.8–46.6)
HGB: 11.4 g/dL — ABNORMAL LOW (ref 11.6–15.9)
LYMPH%: 11.9 % — AB (ref 14.0–49.7)
MCH: 31 pg (ref 25.1–34.0)
MCHC: 33.8 g/dL (ref 31.5–36.0)
MCV: 91.6 fL (ref 79.5–101.0)
MONO#: 0.9 10*3/uL (ref 0.1–0.9)
MONO%: 6.4 % (ref 0.0–14.0)
NEUT%: 81.4 % — AB (ref 38.4–76.8)
NEUTROS ABS: 10.8 10*3/uL — AB (ref 1.5–6.5)
PLATELETS: 136 10*3/uL — AB (ref 145–400)
RBC: 3.68 10*6/uL — ABNORMAL LOW (ref 3.70–5.45)
RDW: 17.9 % — ABNORMAL HIGH (ref 11.2–14.5)
WBC: 13.3 10*3/uL — AB (ref 3.9–10.3)
lymph#: 1.6 10*3/uL (ref 0.9–3.3)

## 2016-06-08 MED ORDER — IOPAMIDOL (ISOVUE-300) INJECTION 61%
INTRAVENOUS | Status: AC
  Administered 2016-06-08: 75 mL
  Filled 2016-06-08: qty 75

## 2016-06-15 ENCOUNTER — Telehealth: Payer: Self-pay | Admitting: Internal Medicine

## 2016-06-15 ENCOUNTER — Encounter: Payer: Self-pay | Admitting: Internal Medicine

## 2016-06-15 ENCOUNTER — Other Ambulatory Visit (HOSPITAL_BASED_OUTPATIENT_CLINIC_OR_DEPARTMENT_OTHER): Payer: No Typology Code available for payment source

## 2016-06-15 ENCOUNTER — Telehealth: Payer: Self-pay | Admitting: *Deleted

## 2016-06-15 ENCOUNTER — Ambulatory Visit (HOSPITAL_BASED_OUTPATIENT_CLINIC_OR_DEPARTMENT_OTHER): Payer: No Typology Code available for payment source

## 2016-06-15 ENCOUNTER — Ambulatory Visit (HOSPITAL_BASED_OUTPATIENT_CLINIC_OR_DEPARTMENT_OTHER): Payer: No Typology Code available for payment source | Admitting: Internal Medicine

## 2016-06-15 VITALS — BP 119/71 | HR 83 | Temp 98.7°F | Resp 16 | Wt 152.7 lb

## 2016-06-15 DIAGNOSIS — I10 Essential (primary) hypertension: Secondary | ICD-10-CM | POA: Diagnosis not present

## 2016-06-15 DIAGNOSIS — Z5111 Encounter for antineoplastic chemotherapy: Secondary | ICD-10-CM

## 2016-06-15 DIAGNOSIS — G4701 Insomnia due to medical condition: Secondary | ICD-10-CM

## 2016-06-15 DIAGNOSIS — C3411 Malignant neoplasm of upper lobe, right bronchus or lung: Secondary | ICD-10-CM | POA: Diagnosis not present

## 2016-06-15 DIAGNOSIS — C3491 Malignant neoplasm of unspecified part of right bronchus or lung: Secondary | ICD-10-CM

## 2016-06-15 DIAGNOSIS — C7951 Secondary malignant neoplasm of bone: Secondary | ICD-10-CM | POA: Diagnosis not present

## 2016-06-15 DIAGNOSIS — G4700 Insomnia, unspecified: Secondary | ICD-10-CM

## 2016-06-15 HISTORY — DX: Insomnia, unspecified: G47.00

## 2016-06-15 LAB — CBC WITH DIFFERENTIAL/PLATELET
BASO%: 0.2 % (ref 0.0–2.0)
Basophils Absolute: 0 10*3/uL (ref 0.0–0.1)
EOS ABS: 0 10*3/uL (ref 0.0–0.5)
EOS%: 0.2 % (ref 0.0–7.0)
HEMATOCRIT: 35.7 % (ref 34.8–46.6)
HEMOGLOBIN: 12.2 g/dL (ref 11.6–15.9)
LYMPH%: 11.7 % — ABNORMAL LOW (ref 14.0–49.7)
MCH: 31.2 pg (ref 25.1–34.0)
MCHC: 34.2 g/dL (ref 31.5–36.0)
MCV: 91.3 fL (ref 79.5–101.0)
MONO#: 1.1 10*3/uL — AB (ref 0.1–0.9)
MONO%: 9.4 % (ref 0.0–14.0)
NEUT#: 9.5 10*3/uL — ABNORMAL HIGH (ref 1.5–6.5)
NEUT%: 78.5 % — ABNORMAL HIGH (ref 38.4–76.8)
PLATELETS: 254 10*3/uL (ref 145–400)
RBC: 3.91 10*6/uL (ref 3.70–5.45)
RDW: 17.3 % — AB (ref 11.2–14.5)
WBC: 12.1 10*3/uL — ABNORMAL HIGH (ref 3.9–10.3)
lymph#: 1.4 10*3/uL (ref 0.9–3.3)

## 2016-06-15 LAB — COMPREHENSIVE METABOLIC PANEL
ALBUMIN: 4.1 g/dL (ref 3.5–5.0)
ALK PHOS: 88 U/L (ref 40–150)
ALT: 25 U/L (ref 0–55)
ANION GAP: 11 meq/L (ref 3–11)
AST: 17 U/L (ref 5–34)
BILIRUBIN TOTAL: 0.26 mg/dL (ref 0.20–1.20)
BUN: 8.9 mg/dL (ref 7.0–26.0)
CALCIUM: 9.8 mg/dL (ref 8.4–10.4)
CO2: 28 mEq/L (ref 22–29)
Chloride: 97 mEq/L — ABNORMAL LOW (ref 98–109)
Creatinine: 0.7 mg/dL (ref 0.6–1.1)
GLUCOSE: 104 mg/dL (ref 70–140)
Potassium: 3.7 mEq/L (ref 3.5–5.1)
Sodium: 135 mEq/L — ABNORMAL LOW (ref 136–145)
TOTAL PROTEIN: 7.2 g/dL (ref 6.4–8.3)

## 2016-06-15 MED ORDER — DEXAMETHASONE SODIUM PHOSPHATE 10 MG/ML IJ SOLN
INTRAMUSCULAR | Status: AC
  Filled 2016-06-15: qty 1

## 2016-06-15 MED ORDER — SODIUM CHLORIDE 0.9 % IV SOLN
Freq: Once | INTRAVENOUS | Status: AC
  Administered 2016-06-15: 13:00:00 via INTRAVENOUS

## 2016-06-15 MED ORDER — DEXAMETHASONE SODIUM PHOSPHATE 10 MG/ML IJ SOLN
10.0000 mg | Freq: Once | INTRAMUSCULAR | Status: AC
  Administered 2016-06-15: 10 mg via INTRAVENOUS

## 2016-06-15 MED ORDER — PALONOSETRON HCL INJECTION 0.25 MG/5ML
0.2500 mg | Freq: Once | INTRAVENOUS | Status: AC
  Administered 2016-06-15: 0.25 mg via INTRAVENOUS

## 2016-06-15 MED ORDER — SODIUM CHLORIDE 0.9 % IV SOLN
100.0000 mg/m2 | Freq: Once | INTRAVENOUS | Status: AC
  Administered 2016-06-15: 170 mg via INTRAVENOUS
  Filled 2016-06-15: qty 8.5

## 2016-06-15 MED ORDER — TEMAZEPAM 30 MG PO CAPS: 30.0000 mg | ORAL_CAPSULE | Freq: Every evening | ORAL | 0 refills | Status: DC | PRN

## 2016-06-15 MED ORDER — SODIUM CHLORIDE 0.9 % IV SOLN
503.0000 mg | Freq: Once | INTRAVENOUS | Status: AC
  Administered 2016-06-15: 500 mg via INTRAVENOUS
  Filled 2016-06-15: qty 50

## 2016-06-15 MED ORDER — PALONOSETRON HCL INJECTION 0.25 MG/5ML
INTRAVENOUS | Status: AC
  Filled 2016-06-15: qty 5

## 2016-06-15 NOTE — Telephone Encounter (Signed)
Message sent to chemo scheduler to be added per 06/15/16 los. Appointments scheduled per 06/15/16 los.  Patient was given a copy of the AVS report and appointment schedule, per 06/15/16 los.

## 2016-06-15 NOTE — Progress Notes (Signed)
[facility] [phone]   [phone]  OFFICE PROGRESS NOTE  [name], PA-C [place] [address]  DIAGNOSIS: Extensive stage (T1b, N3, M1b) small cell lung cancer presented with right upper lobe lung mass, massive mediastinal lymphadenopathy and metastatic bone lesions diagnosed in October 2017.  PRIOR THERAPY: Palliative radiotherapy to the large mediastinal mass under the care of Dr. Tammi Klippel.  CURRENT THERAPY: Systemic chemotherapy with carboplatin for AUC of 5 on day 1 and etoposide 100 MG/M2 on days 1, 2 and 3 with Neulasta support status post 4 cycles.  INTERVAL HISTORY: [name] 66 y.o. female returns to the clinic today for follow-up visit accompanied by her daughter and her interpreter. The patient continues to tolerate her current systemic chemotherapy with carboplatin and etoposide fairly well. She is status post 4 cycles of her treatment. She was not eligible for the ROVA-T maintenance clinical trial because of language barrier. She was upset about this situation. She denied having any chest pain, shortness of breath, cough or hemoptysis. She continues to have mild fatigue. She denied having any nausea, vomiting, diarrhea or constipation. She lost 2 pounds since her last visit. She denied having any headache or visual changes. She has mild alopecia with this treatment. The patient had repeat CT scan of the chest performed recently and she is here for evaluation and discussion of her scan results and treatment options.  MEDICAL HISTORY: Past Medical History:  Diagnosis Date  . Anemia    in the past  . Arthritis   . Depression   . Encounter for antineoplastic chemotherapy 03/30/2016  . Headache   . Hyperlipidemia   . Hypertension   . Lung mass 03/12/2016  . PONV (postoperative nausea and vomiting)    after hysterectomy  . Shortness of breath dyspnea    with exertion    ALLERGIES:  is allergic to  penicillins.  MEDICATIONS:  Current Outpatient Prescriptions  Medication Sig Dispense Refill  . atorvastatin (LIPITOR) 40 MG tablet Take 40 mg by mouth at bedtime.   0  . benzonatate (TESSALON) 100 MG capsule Take 1-2 capsules (100-200 mg total) by mouth 3 (three) times daily as needed for cough. 40 capsule 0  . hydrochlorothiazide (HYDRODIURIL) 25 MG tablet Take 1 tablet (25 mg total) by mouth daily. 90 tablet 1  . prochlorperazine (COMPAZINE) 10 MG tablet Take 1 tablet (10 mg total) by mouth every 6 (six) hours as needed for nausea or vomiting. 30 tablet 0  . temazepam (RESTORIL) 30 MG capsule Take 1 capsule (30 mg total) by mouth at bedtime as needed for sleep. 30 capsule 0   No current facility-administered medications for this visit.     SURGICAL HISTORY:  Past Surgical History:  Procedure Laterality Date  . ABDOMINAL HYSTERECTOMY     2009 , due to fobroids, benign path for uterus, tumor and also cervix.   [facility] BACK SURGERY     lower back  . COLONOSCOPY    . VIDEO BRONCHOSCOPY WITH ENDOBRONCHIAL ULTRASOUND N/A 03/15/2016   Procedure: VIDEO BRONCHOSCOPY WITH ENDOBRONCHIAL ULTRASOUND AND BIOPSY OF LEVEL SEVEN LYMPH NODE;  Surgeon: Grace Isaac, MD;  Location: [facility];  Service: Thoracic;  Laterality: N/A;    REVIEW OF SYSTEMS:  Constitutional: positive for fatigue Eyes: negative Ears, nose, mouth, throat, and face: negative Respiratory: negative Cardiovascular: negative Gastrointestinal: negative Genitourinary:negative Integument/breast: negative Hematologic/lymphatic: negative Musculoskeletal:negative Neurological: negative Behavioral/Psych: negative Endocrine: negative Allergic/Immunologic: negative   PHYSICAL EXAMINATION:  General appearance: alert, cooperative, fatigued and no distress Head: Normocephalic, without obvious abnormality, atraumatic Neck: no adenopathy, no JVD, supple, symmetrical, trachea midline and thyroid not enlarged, symmetric, no  tenderness/mass/nodules Lymph nodes: Cervical, supraclavicular, and axillary nodes normal. Resp: clear to auscultation bilaterally Back: symmetric, no curvature. ROM normal. No CVA tenderness. Cardio: regular rate and rhythm, S1, S2 normal, no murmur, click, rub or gallop GI: soft, non-tender; bowel sounds normal; no masses,  no organomegaly Extremities: extremities normal, atraumatic, no cyanosis or edema Neurologic: Alert and oriented X 3, normal strength and tone. Normal symmetric reflexes. Normal coordination and gait  ECOG PERFORMANCE STATUS: 1 - Symptomatic but completely ambulatory  Blood pressure 119/71, pulse 83, temperature 98.7 F (37.1 C), temperature source Oral, resp. rate 16, weight 152 lb 11.2 oz (69.3 kg), SpO2 95 %.  LABORATORY DATA: Lab Results  Component Value Date   WBC 12.1 (H) 06/15/2016   HGB 12.2 06/15/2016   HCT 35.7 06/15/2016   MCV 91.3 06/15/2016   PLT 254 06/15/2016      Chemistry      Component Value Date/Time   NA 135 (L) 06/15/2016 1057   K 3.7 06/15/2016 1057   CL 98 (L) 03/15/2016 1325   CO2 28 06/15/2016 1057   BUN 8.9 06/15/2016 1057   CREATININE 0.7 06/15/2016 1057      Component Value Date/Time   CALCIUM 9.8 06/15/2016 1057   ALKPHOS 88 06/15/2016 1057   AST 17 06/15/2016 1057   ALT 25 06/15/2016 1057   BILITOT 0.26 06/15/2016 1057       RADIOGRAPHIC STUDIES: Ct Chest W Contrast  Result Date: 06/08/2016 CLINICAL DATA:  Small-cell lung cancer, right upper lobe. EXAM: CT CHEST WITH CONTRAST TECHNIQUE: Multidetector CT imaging of the chest was performed during intravenous contrast administration. CONTRAST:  1 ISOVUE-300 IOPAMIDOL (ISOVUE-300) INJECTION 61% COMPARISON:  04/30/2016 chest CT.  03/26/2016 PET-CT. FINDINGS: Cardiovascular: The heart size is normal. No pericardial effusion. Coronary artery calcification is noted. Atherosclerotic calcification is noted in the wall of the thoracic aorta. Mediastinum/Nodes: Abnormal soft  tissue attenuation in the mediastinum persist. 1.9 cm residual soft tissue attenuation identified in the right paratracheal region on the prior study measures 1.3 cm today. Right suprahilar lymph node measured 12 mm short axis on the prior study is now 11 mm. No new or progressive mediastinal or hilar lymphadenopathy. Lungs/Pleura: Biapical pleural-parenchymal scarring is stable. Right upper lobe pulmonary nodule measured at 13 x 7 mm previously now measures 8 x 6 mm. Immediately cranial to that nodule is not 8 mm nodule seen on image 50 of series 7, not substantially changed in the interval. No new or progressive pulmonary nodule or mass. No focal airspace consolidation. No pulmonary edema or pleural effusion. Upper Abdomen: Subtle 8 mm hypoattenuating nodule inferior right liver is stable. 3.7 cm right adrenal mass is unchanged. Left adrenal gland shows thickening without nodule or mass lesion. Musculoskeletal: Multiple sclerotic bone lesions are again identified without substantial interval change. IMPRESSION: 1. Continued interval decrease in the right upper lobe pulmonary nodule. 2. Mediastinal and right hilar lymphadenopathy has decreased in the interval. 3. No substantial change in bony metastases. 4. 3.7 cm right adrenal lesion characterized as adenoma on the recent PET-CT. Electronically Signed   By: Misty Stanley M.D.   On: 06/08/2016 15:56    ASSESSMENT AND PLAN: This is a very pleasant 66 years old white female with extensive stage small cell lung cancer. She is currently undergoing systemic chemotherapy with carboplatin and  etoposide status post 4 cycles. She is tolerating her treatment well with no significant adverse effects. The patient had repeat CT scan of the chest performed recently. I personally and independently reviewed the scan images and discuss the results and showed the images to the patient and her family. Her scan continues to showed improvement of her disease. Unfortunately the  patient was not a good candidate for the maintenance clinical trial with ROVA-T because of language barrier.  I recommended for the patient to proceed with 2 more cycles of systemic chemotherapy with carboplatin and etoposide. She will proceed with cycle #5 today. She will come back for follow-up visit in 3 weeks for evaluation with the start of cycle #6. For the insomnia, the patient was giving a refill of history 30 mg by mouth daily at bedtime. For hypertension, she will continue her current treatment with hydrochlorothiazide. The patient was advised to call immediately if she has any concerning symptoms in the interval. The patient voices understanding of current disease status and treatment options and is in agreement with the current care plan.  All questions were answered. The patient knows to call the clinic with any problems, questions or concerns. We can certainly see the patient much sooner if necessary.  Disclaimer: This note was dictated with voice recognition software. Similar sounding words can inadvertently be transcribed and may not be corrected upon review.

## 2016-06-15 NOTE — Telephone Encounter (Signed)
Per LOS I have scheduled appts and gave calendar

## 2016-06-15 NOTE — Patient Instructions (Signed)
[facility] Discharge Instructions for Patients Receiving Chemotherapy  Today you received the following chemotherapy agents: Carboplatin and Etoposide.  To help prevent nausea and vomiting after your treatment, we encourage you to take your nausea medication as prescribed.  If you develop nausea and vomiting that is not controlled by your nausea medication, call the clinic.   BELOW ARE SYMPTOMS THAT SHOULD BE REPORTED IMMEDIATELY:  *FEVER GREATER THAN 100.5 F  *CHILLS WITH OR WITHOUT FEVER  NAUSEA AND VOMITING THAT IS NOT CONTROLLED WITH YOUR NAUSEA MEDICATION  *UNUSUAL SHORTNESS OF BREATH  *UNUSUAL BRUISING OR BLEEDING  TENDERNESS IN MOUTH AND THROAT WITH OR WITHOUT PRESENCE OF ULCERS  *URINARY PROBLEMS  *BOWEL PROBLEMS  UNUSUAL RASH Items with * indicate a potential emergency and should be followed up as soon as possible.  Feel free to call the clinic you have any questions or concerns. The clinic phone number is [phone].  Please show the [facility] at check-in to the Emergency Department and triage nurse.

## 2016-06-16 ENCOUNTER — Ambulatory Visit (HOSPITAL_BASED_OUTPATIENT_CLINIC_OR_DEPARTMENT_OTHER): Payer: No Typology Code available for payment source

## 2016-06-16 VITALS — BP 98/65 | HR 97 | Temp 98.2°F | Resp 18

## 2016-06-16 DIAGNOSIS — Z5111 Encounter for antineoplastic chemotherapy: Secondary | ICD-10-CM | POA: Diagnosis not present

## 2016-06-16 DIAGNOSIS — C7951 Secondary malignant neoplasm of bone: Secondary | ICD-10-CM | POA: Diagnosis not present

## 2016-06-16 DIAGNOSIS — C3411 Malignant neoplasm of upper lobe, right bronchus or lung: Secondary | ICD-10-CM

## 2016-06-16 DIAGNOSIS — C3491 Malignant neoplasm of unspecified part of right bronchus or lung: Secondary | ICD-10-CM

## 2016-06-16 MED ORDER — SODIUM CHLORIDE 0.9 % IV SOLN
100.0000 mg/m2 | Freq: Once | INTRAVENOUS | Status: AC
  Administered 2016-06-16: 170 mg via INTRAVENOUS
  Filled 2016-06-16: qty 8.5

## 2016-06-16 MED ORDER — SODIUM CHLORIDE 0.9 % IV SOLN
Freq: Once | INTRAVENOUS | Status: AC
  Administered 2016-06-16: 14:00:00 via INTRAVENOUS

## 2016-06-16 MED ORDER — DEXAMETHASONE SODIUM PHOSPHATE 10 MG/ML IJ SOLN
10.0000 mg | Freq: Once | INTRAMUSCULAR | Status: AC
  Administered 2016-06-16: 10 mg via INTRAVENOUS

## 2016-06-16 MED ORDER — DEXAMETHASONE SODIUM PHOSPHATE 10 MG/ML IJ SOLN
INTRAMUSCULAR | Status: AC
  Filled 2016-06-16: qty 1

## 2016-06-16 NOTE — Patient Instructions (Signed)
[facility] for [facility] Receiving Chemotherapy  Today you received the following chemotherapy agents Etoposide.   To help prevent nausea and vomiting after your treatment, we encourage you to take your nausea medication as prescribed.   If you develop nausea and vomiting that is not controlled by your nausea medication, call the clinic.   BELOW ARE SYMPTOMS THAT SHOULD BE REPORTED IMMEDIATELY:  *FEVER GREATER THAN 100.5 F  *CHILLS WITH OR WITHOUT FEVER  NAUSEA AND VOMITING THAT IS NOT CONTROLLED WITH YOUR NAUSEA MEDICATION  *UNUSUAL SHORTNESS OF BREATH  *UNUSUAL BRUISING OR BLEEDING  TENDERNESS IN MOUTH AND THROAT WITH OR WITHOUT PRESENCE OF ULCERS  *URINARY PROBLEMS  *BOWEL PROBLEMS  UNUSUAL RASH Items with * indicate a potential emergency and should be followed up as soon as possible.  Feel free to call the clinic you have any questions or concerns. The clinic phone number is [phone].  Please show the CHEMO ALERT CARD at check-in to the Emergency Department and triage nurse.   

## 2016-06-17 ENCOUNTER — Ambulatory Visit (HOSPITAL_BASED_OUTPATIENT_CLINIC_OR_DEPARTMENT_OTHER): Payer: No Typology Code available for payment source

## 2016-06-17 VITALS — BP 112/64 | HR 74 | Temp 97.8°F | Resp 18

## 2016-06-17 DIAGNOSIS — C7951 Secondary malignant neoplasm of bone: Secondary | ICD-10-CM | POA: Diagnosis not present

## 2016-06-17 DIAGNOSIS — C3491 Malignant neoplasm of unspecified part of right bronchus or lung: Secondary | ICD-10-CM

## 2016-06-17 DIAGNOSIS — Z5111 Encounter for antineoplastic chemotherapy: Secondary | ICD-10-CM | POA: Diagnosis not present

## 2016-06-17 DIAGNOSIS — C3411 Malignant neoplasm of upper lobe, right bronchus or lung: Secondary | ICD-10-CM | POA: Diagnosis not present

## 2016-06-17 MED ORDER — DEXAMETHASONE SODIUM PHOSPHATE 10 MG/ML IJ SOLN
10.0000 mg | Freq: Once | INTRAMUSCULAR | Status: AC
  Administered 2016-06-17: 10 mg via INTRAVENOUS

## 2016-06-17 MED ORDER — SODIUM CHLORIDE 0.9 % IV SOLN
Freq: Once | INTRAVENOUS | Status: AC
  Administered 2016-06-17: 14:00:00 via INTRAVENOUS

## 2016-06-17 MED ORDER — DEXAMETHASONE SODIUM PHOSPHATE 10 MG/ML IJ SOLN
INTRAMUSCULAR | Status: AC
  Filled 2016-06-17: qty 1

## 2016-06-17 MED ORDER — SODIUM CHLORIDE 0.9 % IV SOLN
100.0000 mg/m2 | Freq: Once | INTRAVENOUS | Status: AC
  Administered 2016-06-17: 170 mg via INTRAVENOUS
  Filled 2016-06-17: qty 8.5

## 2016-06-17 NOTE — Patient Instructions (Signed)
[facility] Discharge Instructions for Patients Receiving Chemotherapy  Today you received the following chemotherapy agents: Etoposide   To help prevent nausea and vomiting after your treatment, we encourage you to take your nausea medication as directed.    If you develop nausea and vomiting that is not controlled by your nausea medication, call the clinic.   BELOW ARE SYMPTOMS THAT SHOULD BE REPORTED IMMEDIATELY:  *FEVER GREATER THAN 100.5 F  *CHILLS WITH OR WITHOUT FEVER  NAUSEA AND VOMITING THAT IS NOT CONTROLLED WITH YOUR NAUSEA MEDICATION  *UNUSUAL SHORTNESS OF BREATH  *UNUSUAL BRUISING OR BLEEDING  TENDERNESS IN MOUTH AND THROAT WITH OR WITHOUT PRESENCE OF ULCERS  *URINARY PROBLEMS  *BOWEL PROBLEMS  UNUSUAL RASH Items with * indicate a potential emergency and should be followed up as soon as possible.  Feel free to call the clinic you have any questions or concerns. The clinic phone number is [phone].  Please show the CHEMO ALERT CARD at check-in to the Emergency Department and triage nurse.   

## 2016-06-19 ENCOUNTER — Ambulatory Visit (HOSPITAL_BASED_OUTPATIENT_CLINIC_OR_DEPARTMENT_OTHER): Payer: No Typology Code available for payment source

## 2016-06-19 VITALS — BP 114/79 | HR 102 | Temp 97.5°F | Resp 20

## 2016-06-19 DIAGNOSIS — C3411 Malignant neoplasm of upper lobe, right bronchus or lung: Secondary | ICD-10-CM | POA: Diagnosis not present

## 2016-06-19 DIAGNOSIS — C3491 Malignant neoplasm of unspecified part of right bronchus or lung: Secondary | ICD-10-CM

## 2016-06-19 MED ORDER — PEGFILGRASTIM INJECTION 6 MG/0.6ML ~~LOC~~
6.0000 mg | PREFILLED_SYRINGE | Freq: Once | SUBCUTANEOUS | Status: AC
  Administered 2016-06-19: 6 mg via SUBCUTANEOUS

## 2016-06-19 NOTE — Patient Instructions (Signed)
Pegfilgrastim injection What is this medicine? PEGFILGRASTIM (PEG fil gra stim) is a long-acting granulocyte colony-stimulating factor that stimulates the growth of neutrophils, a type of white blood cell important in the body's fight against infection. It is used to reduce the incidence of fever and infection in patients with certain types of cancer who are receiving chemotherapy that affects the bone marrow, and to increase survival after being exposed to high doses of radiation. This medicine may be used for other purposes; ask your health care provider or pharmacist if you have questions. COMMON BRAND NAME(S): Neulasta What should I tell my health care provider before I take this medicine? They need to know if you have any of these conditions: -kidney disease -latex allergy -ongoing radiation therapy -sickle cell disease -skin reactions to acrylic adhesives (On-Body Injector only) -an unusual or allergic reaction to pegfilgrastim, filgrastim, other medicines, foods, dyes, or preservatives -pregnant or trying to get pregnant -breast-feeding How should I use this medicine? This medicine is for injection under the skin. If you get this medicine at home, you will be taught how to prepare and give the pre-filled syringe or how to use the On-body Injector. Refer to the patient Instructions for Use for detailed instructions. Use exactly as directed. Take your medicine at regular intervals. Do not take your medicine more often than directed. It is important that you put your used needles and syringes in a special sharps container. Do not put them in a trash can. If you do not have a sharps container, call your pharmacist or healthcare provider to get one. Talk to your pediatrician regarding the use of this medicine in children. While this drug may be prescribed for selected conditions, precautions do apply. Overdosage: If you think you have taken too much of this medicine contact a poison control  center or emergency room at once. NOTE: This medicine is only for you. Do not share this medicine with others. What if I miss a dose? It is important not to miss your dose. Call your doctor or health care professional if you miss your dose. If you miss a dose due to an On-body Injector failure or leakage, a new dose should be administered as soon as possible using a single prefilled syringe for manual use. What may interact with this medicine? Interactions have not been studied. Give your health care provider a list of all the medicines, herbs, non-prescription drugs, or dietary supplements you use. Also tell them if you smoke, drink alcohol, or use illegal drugs. Some items may interact with your medicine. This list may not describe all possible interactions. Give your health care provider a list of all the medicines, herbs, non-prescription drugs, or dietary supplements you use. Also tell them if you smoke, drink alcohol, or use illegal drugs. Some items may interact with your medicine. What should I watch for while using this medicine? You may need blood work done while you are taking this medicine. If you are going to need a MRI, CT scan, or other procedure, tell your doctor that you are using this medicine (On-Body Injector only). What side effects may I notice from receiving this medicine? Side effects that you should report to your doctor or health care professional as soon as possible: -allergic reactions like skin rash, itching or hives, swelling of the face, lips, or tongue -dizziness -fever -pain, redness, or irritation at site where injected -pinpoint red spots on the skin -red or dark-brown urine -shortness of breath or breathing problems -stomach or   side pain, or pain at the shoulder -swelling -tiredness -trouble passing urine or change in the amount of urine Side effects that usually do not require medical attention (report to your doctor or health care professional if they  continue or are bothersome): -bone pain -muscle pain This list may not describe all possible side effects. Call your doctor for medical advice about side effects. You may report side effects to FDA at 1-800-FDA-1088. Where should I keep my medicine? Keep out of the reach of children. Store pre-filled syringes in a refrigerator between 2 and 8 degrees C (36 and 46 degrees F). Do not freeze. Keep in carton to protect from light. Throw away this medicine if it is left out of the refrigerator for more than 48 hours. Throw away any unused medicine after the expiration date. NOTE: This sheet is a summary. It may not cover all possible information. If you have questions about this medicine, talk to your doctor, pharmacist, or health care provider.  2017 Elsevier/Gold Standard (2014-06-20 14:30:14)  

## 2016-06-22 ENCOUNTER — Other Ambulatory Visit (HOSPITAL_BASED_OUTPATIENT_CLINIC_OR_DEPARTMENT_OTHER): Payer: No Typology Code available for payment source

## 2016-06-22 DIAGNOSIS — C3491 Malignant neoplasm of unspecified part of right bronchus or lung: Secondary | ICD-10-CM

## 2016-06-22 DIAGNOSIS — C3411 Malignant neoplasm of upper lobe, right bronchus or lung: Secondary | ICD-10-CM

## 2016-06-22 LAB — COMPREHENSIVE METABOLIC PANEL
ALT: 27 U/L (ref 0–55)
AST: 14 U/L (ref 5–34)
Albumin: 3.9 g/dL (ref 3.5–5.0)
Alkaline Phosphatase: 139 U/L (ref 40–150)
Anion Gap: 10 mEq/L (ref 3–11)
BILIRUBIN TOTAL: 0.31 mg/dL (ref 0.20–1.20)
BUN: 9.9 mg/dL (ref 7.0–26.0)
CO2: 30 meq/L — AB (ref 22–29)
Calcium: 9.3 mg/dL (ref 8.4–10.4)
Chloride: 93 mEq/L — ABNORMAL LOW (ref 98–109)
Creatinine: 0.7 mg/dL (ref 0.6–1.1)
EGFR: 90 mL/min/{1.73_m2} — AB (ref >90)
GLUCOSE: 114 mg/dL (ref 70–140)
Potassium: 3.3 mEq/L — ABNORMAL LOW (ref 3.5–5.1)
SODIUM: 133 meq/L — AB (ref 136–145)
TOTAL PROTEIN: 6.6 g/dL (ref 6.4–8.3)

## 2016-06-22 LAB — CBC WITH DIFFERENTIAL/PLATELET
BASO%: 0.2 % (ref 0.0–2.0)
Basophils Absolute: 0.1 10*3/uL (ref 0.0–0.1)
EOS%: 0.3 % (ref 0.0–7.0)
Eosinophils Absolute: 0.1 10*3/uL (ref 0.0–0.5)
HCT: 32.2 % — ABNORMAL LOW (ref 34.8–46.6)
HGB: 11 g/dL — ABNORMAL LOW (ref 11.6–15.9)
LYMPH%: 5.7 % — AB (ref 14.0–49.7)
MCH: 31.4 pg (ref 25.1–34.0)
MCHC: 34.2 g/dL (ref 31.5–36.0)
MCV: 92 fL (ref 79.5–101.0)
MONO#: 1.4 10*3/uL — ABNORMAL HIGH (ref 0.1–0.9)
MONO%: 5.5 % (ref 0.0–14.0)
NEUT%: 88.3 % — ABNORMAL HIGH (ref 38.4–76.8)
NEUTROS ABS: 21.5 10*3/uL — AB (ref 1.5–6.5)
Platelets: 205 10*3/uL (ref 145–400)
RBC: 3.5 10*6/uL — ABNORMAL LOW (ref 3.70–5.45)
RDW: 16.3 % — ABNORMAL HIGH (ref 11.2–14.5)
WBC: 24.3 10*3/uL — AB (ref 3.9–10.3)
lymph#: 1.4 10*3/uL (ref 0.9–3.3)

## 2016-06-25 ENCOUNTER — Telehealth: Payer: Self-pay | Admitting: Internal Medicine

## 2016-06-25 NOTE — Telephone Encounter (Signed)
Faxed pet  and mri done on 03/26/16 to [facility] [phone]

## 2016-06-28 ENCOUNTER — Other Ambulatory Visit: Payer: Self-pay

## 2016-06-28 DIAGNOSIS — C3491 Malignant neoplasm of unspecified part of right bronchus or lung: Secondary | ICD-10-CM

## 2016-06-29 ENCOUNTER — Other Ambulatory Visit (HOSPITAL_BASED_OUTPATIENT_CLINIC_OR_DEPARTMENT_OTHER): Payer: No Typology Code available for payment source

## 2016-06-29 DIAGNOSIS — C3491 Malignant neoplasm of unspecified part of right bronchus or lung: Secondary | ICD-10-CM

## 2016-06-29 DIAGNOSIS — C3411 Malignant neoplasm of upper lobe, right bronchus or lung: Secondary | ICD-10-CM | POA: Diagnosis not present

## 2016-06-29 LAB — CBC WITH DIFFERENTIAL/PLATELET
BASO%: 0.2 % (ref 0.0–2.0)
BASOS ABS: 0 10*3/uL (ref 0.0–0.1)
EOS ABS: 0 10*3/uL (ref 0.0–0.5)
EOS%: 0.2 % (ref 0.0–7.0)
HEMATOCRIT: 36 % (ref 34.8–46.6)
HGB: 12.1 g/dL (ref 11.6–15.9)
LYMPH#: 1.7 10*3/uL (ref 0.9–3.3)
LYMPH%: 13.3 % — ABNORMAL LOW (ref 14.0–49.7)
MCH: 31.9 pg (ref 25.1–34.0)
MCHC: 33.6 g/dL (ref 31.5–36.0)
MCV: 95 fL (ref 79.5–101.0)
MONO#: 0.8 10*3/uL (ref 0.1–0.9)
MONO%: 6.5 % (ref 0.0–14.0)
NEUT#: 10 10*3/uL — ABNORMAL HIGH (ref 1.5–6.5)
NEUT%: 79.8 % — AB (ref 38.4–76.8)
PLATELETS: 140 10*3/uL — AB (ref 145–400)
RBC: 3.79 10*6/uL (ref 3.70–5.45)
RDW: 16.9 % — ABNORMAL HIGH (ref 11.2–14.5)
WBC: 12.5 10*3/uL — ABNORMAL HIGH (ref 3.9–10.3)

## 2016-06-29 LAB — COMPREHENSIVE METABOLIC PANEL
ALBUMIN: 4 g/dL (ref 3.5–5.0)
ALK PHOS: 118 U/L (ref 40–150)
ALT: 33 U/L (ref 0–55)
ANION GAP: 10 meq/L (ref 3–11)
AST: 19 U/L (ref 5–34)
BUN: 10 mg/dL (ref 7.0–26.0)
CALCIUM: 10.1 mg/dL (ref 8.4–10.4)
CO2: 30 mEq/L — ABNORMAL HIGH (ref 22–29)
Chloride: 97 mEq/L — ABNORMAL LOW (ref 98–109)
Creatinine: 0.7 mg/dL (ref 0.6–1.1)
EGFR: 88 mL/min/{1.73_m2} — ABNORMAL LOW (ref >90)
Glucose: 104 mg/dl (ref 70–140)
Potassium: 4 mEq/L (ref 3.5–5.1)
Sodium: 137 mEq/L (ref 136–145)
Total Bilirubin: <0.22 mg/dL (ref 0.20–1.20)
Total Protein: 7.2 g/dL (ref 6.4–8.3)

## 2016-07-06 ENCOUNTER — Encounter: Payer: Self-pay | Admitting: Internal Medicine

## 2016-07-06 ENCOUNTER — Telehealth: Payer: Self-pay | Admitting: Internal Medicine

## 2016-07-06 ENCOUNTER — Ambulatory Visit (HOSPITAL_BASED_OUTPATIENT_CLINIC_OR_DEPARTMENT_OTHER): Payer: No Typology Code available for payment source | Admitting: Internal Medicine

## 2016-07-06 ENCOUNTER — Ambulatory Visit (HOSPITAL_BASED_OUTPATIENT_CLINIC_OR_DEPARTMENT_OTHER): Payer: No Typology Code available for payment source

## 2016-07-06 ENCOUNTER — Other Ambulatory Visit (HOSPITAL_BASED_OUTPATIENT_CLINIC_OR_DEPARTMENT_OTHER): Payer: No Typology Code available for payment source

## 2016-07-06 DIAGNOSIS — C7951 Secondary malignant neoplasm of bone: Secondary | ICD-10-CM

## 2016-07-06 DIAGNOSIS — C3411 Malignant neoplasm of upper lobe, right bronchus or lung: Secondary | ICD-10-CM

## 2016-07-06 DIAGNOSIS — Z5111 Encounter for antineoplastic chemotherapy: Secondary | ICD-10-CM | POA: Diagnosis not present

## 2016-07-06 DIAGNOSIS — C3491 Malignant neoplasm of unspecified part of right bronchus or lung: Secondary | ICD-10-CM

## 2016-07-06 DIAGNOSIS — G4700 Insomnia, unspecified: Secondary | ICD-10-CM | POA: Diagnosis not present

## 2016-07-06 DIAGNOSIS — I10 Essential (primary) hypertension: Secondary | ICD-10-CM | POA: Diagnosis not present

## 2016-07-06 LAB — CBC WITH DIFFERENTIAL/PLATELET
BASO%: 0.4 % (ref 0.0–2.0)
Basophils Absolute: 0 10*3/uL (ref 0.0–0.1)
EOS%: 0.3 % (ref 0.0–7.0)
Eosinophils Absolute: 0 10*3/uL (ref 0.0–0.5)
HCT: 35.9 % (ref 34.8–46.6)
HGB: 12.4 g/dL (ref 11.6–15.9)
LYMPH%: 11.6 % — AB (ref 14.0–49.7)
MCH: 32.6 pg (ref 25.1–34.0)
MCHC: 34.5 g/dL (ref 31.5–36.0)
MCV: 94.4 fL (ref 79.5–101.0)
MONO#: 0.8 10*3/uL (ref 0.1–0.9)
MONO%: 8.2 % (ref 0.0–14.0)
NEUT#: 7.4 10*3/uL — ABNORMAL HIGH (ref 1.5–6.5)
NEUT%: 79.5 % — AB (ref 38.4–76.8)
PLATELETS: 236 10*3/uL (ref 145–400)
RBC: 3.8 10*6/uL (ref 3.70–5.45)
RDW: 17.6 % — ABNORMAL HIGH (ref 11.2–14.5)
WBC: 9.3 10*3/uL (ref 3.9–10.3)
lymph#: 1.1 10*3/uL (ref 0.9–3.3)

## 2016-07-06 LAB — COMPREHENSIVE METABOLIC PANEL
ALT: 23 U/L (ref 0–55)
ANION GAP: 10 meq/L (ref 3–11)
AST: 16 U/L (ref 5–34)
Albumin: 3.9 g/dL (ref 3.5–5.0)
Alkaline Phosphatase: 87 U/L (ref 40–150)
BUN: 8.9 mg/dL (ref 7.0–26.0)
CO2: 27 meq/L (ref 22–29)
CREATININE: 0.7 mg/dL (ref 0.6–1.1)
Calcium: 9.8 mg/dL (ref 8.4–10.4)
Chloride: 98 mEq/L (ref 98–109)
EGFR: >90 mL/min/{1.73_m2} (ref >90)
Glucose: 114 mg/dl (ref 70–140)
POTASSIUM: 3.6 meq/L (ref 3.5–5.1)
Sodium: 135 mEq/L — ABNORMAL LOW (ref 136–145)
Total Bilirubin: 0.28 mg/dL (ref 0.20–1.20)
Total Protein: 6.8 g/dL (ref 6.4–8.3)

## 2016-07-06 MED ORDER — SODIUM CHLORIDE 0.9 % IV SOLN
Freq: Once | INTRAVENOUS | Status: AC
  Administered 2016-07-06: 13:00:00 via INTRAVENOUS

## 2016-07-06 MED ORDER — TEMAZEPAM 30 MG PO CAPS: 30.0000 mg | ORAL_CAPSULE | Freq: Every evening | ORAL | 0 refills | Status: DC | PRN

## 2016-07-06 MED ORDER — PALONOSETRON HCL INJECTION 0.25 MG/5ML
0.2500 mg | Freq: Once | INTRAVENOUS | Status: AC
  Administered 2016-07-06: 0.25 mg via INTRAVENOUS

## 2016-07-06 MED ORDER — PALONOSETRON HCL INJECTION 0.25 MG/5ML
INTRAVENOUS | Status: AC
  Filled 2016-07-06: qty 5

## 2016-07-06 MED ORDER — PROCHLORPERAZINE MALEATE 10 MG PO TABS: 10.0000 mg | ORAL_TABLET | Freq: Four times a day (QID) | ORAL | 0 refills | Status: AC | PRN

## 2016-07-06 MED ORDER — DEXAMETHASONE SODIUM PHOSPHATE 10 MG/ML IJ SOLN
10.0000 mg | Freq: Once | INTRAMUSCULAR | Status: AC
  Administered 2016-07-06: 10 mg via INTRAVENOUS

## 2016-07-06 MED ORDER — OXYCODONE-ACETAMINOPHEN 5-325 MG PO TABS: 1.0000 | ORAL_TABLET | Freq: Four times a day (QID) | ORAL | 0 refills | Status: DC | PRN

## 2016-07-06 MED ORDER — SODIUM CHLORIDE 0.9 % IV SOLN
100.0000 mg/m2 | Freq: Once | INTRAVENOUS | Status: AC
  Administered 2016-07-06: 170 mg via INTRAVENOUS
  Filled 2016-07-06: qty 8.5

## 2016-07-06 MED ORDER — SODIUM CHLORIDE 0.9 % IV SOLN
503.0000 mg | Freq: Once | INTRAVENOUS | Status: AC
  Administered 2016-07-06: 500 mg via INTRAVENOUS
  Filled 2016-07-06: qty 50

## 2016-07-06 MED ORDER — DEXAMETHASONE SODIUM PHOSPHATE 10 MG/ML IJ SOLN
INTRAMUSCULAR | Status: AC
  Filled 2016-07-06: qty 1

## 2016-07-06 NOTE — Telephone Encounter (Signed)
Weekly labs scheduled per 07/06/16 los.

## 2016-07-06 NOTE — Patient Instructions (Addendum)
[facility] Discharge Instructions for Patients Receiving Chemotherapy  Today you received the following chemotherapy agents CARBOPLATIN AND VP-16  To help prevent nausea and vomiting after your treatment, we encourage you to take your nausea medication {as prescribed.  If you develop nausea and vomiting that is not controlled by your nausea medication, call the clinic.   BELOW ARE SYMPTOMS THAT SHOULD BE REPORTED IMMEDIATELY:  *FEVER GREATER THAN 100.5 F  *CHILLS WITH OR WITHOUT FEVER  NAUSEA AND VOMITING THAT IS NOT CONTROLLED WITH YOUR NAUSEA MEDICATION  *UNUSUAL SHORTNESS OF BREATH  *UNUSUAL BRUISING OR BLEEDING  TENDERNESS IN MOUTH AND THROAT WITH OR WITHOUT PRESENCE OF ULCERS  *URINARY PROBLEMS  *BOWEL PROBLEMS  UNUSUAL RASH Items with * indicate a potential emergency and should be followed up as soon as possible.  Feel free to call the clinic you have any questions or concerns. The clinic phone number is [phone].  Please show the [facility] at check-in to the Emergency Department and triage nurse.

## 2016-07-06 NOTE — Telephone Encounter (Signed)
Per Dr Julien Nordmann (verbal) no chemo after 07/08/16 until CT and follow up visit. 2 bottles of contrast given per CT ordered.  Appointments scheduled per 07/06/16 los. Patient was given a copy of the appointment schedule and AVS report, per 07/06/16 los.

## 2016-07-06 NOTE — Progress Notes (Signed)
[facility] [phone]   [phone]  OFFICE PROGRESS NOTE  Madeline Douglas, PA-C [place] [address]  DIAGNOSIS: Extensive stage (T1b, N3, M1b) small cell lung cancer presented with right upper lobe lung mass, massive mediastinal lymphadenopathy and metastatic bone lesions diagnosed in October 2017.  PRIOR THERAPY: Palliative radiotherapy to the large mediastinal mass under the care of Dr. Tammi Klippel.  CURRENT THERAPY: Systemic chemotherapy with carboplatin for AUC of 5 on day 1 and etoposide 100 MG/M2 on days 1, 2 and 3 with Neulasta support status post 5 cycles.  INTERVAL HISTORY: [name] 66 y.o. female came to the clinic today accompanied by her son and her interpreter. The patient is complaining of increasing fatigue and weakness. She has more fatigue and nausea after the last cycle of her chemotherapy. She used her nausea medicine more often recently. She also has decreased appetite but she did not lose much weight. She also has pain around the middle of her back with some radiation to under her left breast. She has no chest pain, shortness of breath, cough or hemoptysis. She has no fever or chills. She denied having any significant headache or visual changes. She is here today for evaluation before starting cycle #6 of her chemotherapy.  MEDICAL HISTORY: Past Medical History:  Diagnosis Date  . Anemia    in the past  . Arthritis   . Depression   . Encounter for antineoplastic chemotherapy 03/30/2016  . Headache   . Hyperlipidemia   . Hypertension   . Insomnia 06/15/2016  . Lung mass 03/12/2016  . PONV (postoperative nausea and vomiting)    after hysterectomy  . Shortness of breath dyspnea    with exertion    ALLERGIES:  is allergic to penicillins.  MEDICATIONS:  Current Outpatient Prescriptions  Medication Sig Dispense Refill  . atorvastatin (LIPITOR) 40 MG tablet Take 40 mg by mouth at bedtime.   0  .  benzonatate (TESSALON) 100 MG capsule Take 1-2 capsules (100-200 mg total) by mouth 3 (three) times daily as needed for cough. 40 capsule 0  . hydrochlorothiazide (HYDRODIURIL) 25 MG tablet Take 1 tablet (25 mg total) by mouth daily. 90 tablet 1  . prochlorperazine (COMPAZINE) 10 MG tablet Take 1 tablet (10 mg total) by mouth every 6 (six) hours as needed for nausea or vomiting. 30 tablet 0  . temazepam (RESTORIL) 30 MG capsule Take 1 capsule (30 mg total) by mouth at bedtime as needed for sleep. 30 capsule 0   No current facility-administered medications for this visit.     SURGICAL HISTORY:  Past Surgical History:  Procedure Laterality Date  . ABDOMINAL HYSTERECTOMY     2009 , due to fobroids, benign path for uterus, tumor and also cervix.   [facility] Kitchen BACK SURGERY     lower back  . COLONOSCOPY    . VIDEO BRONCHOSCOPY WITH ENDOBRONCHIAL ULTRASOUND N/A 03/15/2016   Procedure: VIDEO BRONCHOSCOPY WITH ENDOBRONCHIAL ULTRASOUND AND BIOPSY OF LEVEL SEVEN LYMPH NODE;  Surgeon: Grace Isaac, MD;  Location: [facility];  Service: Thoracic;  Laterality: N/A;    REVIEW OF SYSTEMS:  Constitutional: positive for fatigue Eyes: negative Ears, nose, mouth, throat, and face: negative Respiratory: negative Cardiovascular: negative Gastrointestinal: negative Genitourinary:negative Integument/breast: negative Hematologic/lymphatic: negative Musculoskeletal:positive for back pain Neurological: negative Behavioral/Psych: negative Endocrine: negative Allergic/Immunologic: negative   PHYSICAL EXAMINATION: General appearance: alert, cooperative, fatigued and no distress Head: Normocephalic, without obvious abnormality, atraumatic Neck: no adenopathy,  no JVD, supple, symmetrical, trachea midline and thyroid not enlarged, symmetric, no tenderness/mass/nodules Lymph nodes: Cervical, supraclavicular, and axillary nodes normal. Resp: clear to auscultation bilaterally Back: symmetric, no curvature. ROM normal. No  CVA tenderness. Cardio: regular rate and rhythm, S1, S2 normal, no murmur, click, rub or gallop GI: soft, non-tender; bowel sounds normal; no masses,  no organomegaly Extremities: extremities normal, atraumatic, no cyanosis or edema Neurologic: Alert and oriented X 3, normal strength and tone. Normal symmetric reflexes. Normal coordination and gait  ECOG PERFORMANCE STATUS: 1 - Symptomatic but completely ambulatory  Blood pressure 106/74, pulse 91, temperature 98.4 F (36.9 C), temperature source Oral, resp. rate 18, height '5\' 2"'$  (1.575 m), weight 152 lb 4.8 oz (69.1 kg), SpO2 98 %.  LABORATORY DATA: Lab Results  Component Value Date   WBC 9.3 07/06/2016   HGB 12.4 07/06/2016   HCT 35.9 07/06/2016   MCV 94.4 07/06/2016   PLT 236 07/06/2016      Chemistry      Component Value Date/Time   NA 137 06/29/2016 1401   K 4.0 06/29/2016 1401   CL 98 (L) 03/15/2016 1325   CO2 30 (H) 06/29/2016 1401   BUN 10.0 06/29/2016 1401   CREATININE 0.7 06/29/2016 1401      Component Value Date/Time   CALCIUM 10.1 06/29/2016 1401   ALKPHOS 118 06/29/2016 1401   AST 19 06/29/2016 1401   ALT 33 06/29/2016 1401   BILITOT <0.22 06/29/2016 1401       RADIOGRAPHIC STUDIES: Ct Chest W Contrast  Result Date: 06/08/2016 CLINICAL DATA:  Small-cell lung cancer, right upper lobe. EXAM: CT CHEST WITH CONTRAST TECHNIQUE: Multidetector CT imaging of the chest was performed during intravenous contrast administration. CONTRAST:  1 ISOVUE-300 IOPAMIDOL (ISOVUE-300) INJECTION 61% COMPARISON:  04/30/2016 chest CT.  03/26/2016 PET-CT. FINDINGS: Cardiovascular: The heart size is normal. No pericardial effusion. Coronary artery calcification is noted. Atherosclerotic calcification is noted in the wall of the thoracic aorta. Mediastinum/Nodes: Abnormal soft tissue attenuation in the mediastinum persist. 1.9 cm residual soft tissue attenuation identified in the right paratracheal region on the prior study measures 1.3  cm today. Right suprahilar lymph node measured 12 mm short axis on the prior study is now 11 mm. No new or progressive mediastinal or hilar lymphadenopathy. Lungs/Pleura: Biapical pleural-parenchymal scarring is stable. Right upper lobe pulmonary nodule measured at 13 x 7 mm previously now measures 8 x 6 mm. Immediately cranial to that nodule is not 8 mm nodule seen on image 50 of series 7, not substantially changed in the interval. No new or progressive pulmonary nodule or mass. No focal airspace consolidation. No pulmonary edema or pleural effusion. Upper Abdomen: Subtle 8 mm hypoattenuating nodule inferior right liver is stable. 3.7 cm right adrenal mass is unchanged. Left adrenal gland shows thickening without nodule or mass lesion. Musculoskeletal: Multiple sclerotic bone lesions are again identified without substantial interval change. IMPRESSION: 1. Continued interval decrease in the right upper lobe pulmonary nodule. 2. Mediastinal and right hilar lymphadenopathy has decreased in the interval. 3. No substantial change in bony metastases. 4. 3.7 cm right adrenal lesion characterized as adenoma on the recent PET-CT. Electronically Signed   By: Misty Stanley M.D.   On: 06/08/2016 15:56    ASSESSMENT AND PLAN:  This is a very pleasant 66 years old white female recently diagnosed with extensive stage small cell lung cancer. She is currently undergoing systemic chemotherapy with carboplatin and etoposide status post 5 cycles. The patient is tolerating her  treatment well with no significant adverse effects except for increasing fatigue and nausea after the last cycle of her treatment. I recommended for the patient to proceed with cycle #6 today as scheduled. I will see her back for follow-up visit in 3 weeks for evaluation after repeating CT scan of the chest, abdomen and pelvis for restaging of her disease. For nausea, I gave the patient refill of Compazine 10 mg by mouth every 6 hours as needed. For pain  management, I started the patient on Percocet 5/325 mg by mouth every 6 hours as needed for pain. For insomnia, the patient was given a refill for Restoril 30 mg by mouth daily at bedtime when necessary. For the hypertension, she will continue her current treatment with hydrochlorothiazide. She was advised to call immediately if she has any concerning symptoms in the interval. The patient voices understanding of current disease status and treatment options and is in agreement with the current care plan.  All questions were answered. The patient knows to call the clinic with any problems, questions or concerns. We can certainly see the patient much sooner if necessary.  Disclaimer: This note was dictated with voice recognition software. Similar sounding words can inadvertently be transcribed and may not be corrected upon review.

## 2016-07-07 ENCOUNTER — Ambulatory Visit (HOSPITAL_BASED_OUTPATIENT_CLINIC_OR_DEPARTMENT_OTHER): Payer: No Typology Code available for payment source

## 2016-07-07 VITALS — BP 111/69 | HR 98 | Temp 98.0°F | Resp 16

## 2016-07-07 DIAGNOSIS — C3411 Malignant neoplasm of upper lobe, right bronchus or lung: Secondary | ICD-10-CM

## 2016-07-07 DIAGNOSIS — C7951 Secondary malignant neoplasm of bone: Secondary | ICD-10-CM

## 2016-07-07 DIAGNOSIS — C3491 Malignant neoplasm of unspecified part of right bronchus or lung: Secondary | ICD-10-CM

## 2016-07-07 DIAGNOSIS — Z5111 Encounter for antineoplastic chemotherapy: Secondary | ICD-10-CM

## 2016-07-07 MED ORDER — DEXAMETHASONE SODIUM PHOSPHATE 10 MG/ML IJ SOLN
INTRAMUSCULAR | Status: AC
  Filled 2016-07-07: qty 1

## 2016-07-07 MED ORDER — SODIUM CHLORIDE 0.9 % IV SOLN
100.0000 mg/m2 | Freq: Once | INTRAVENOUS | Status: AC
  Administered 2016-07-07: 170 mg via INTRAVENOUS
  Filled 2016-07-07: qty 8.5

## 2016-07-07 MED ORDER — DEXAMETHASONE SODIUM PHOSPHATE 10 MG/ML IJ SOLN
10.0000 mg | Freq: Once | INTRAMUSCULAR | Status: AC
  Administered 2016-07-07: 10 mg via INTRAVENOUS

## 2016-07-07 MED ORDER — SODIUM CHLORIDE 0.9 % IV SOLN
Freq: Once | INTRAVENOUS | Status: AC
  Administered 2016-07-07: 15:00:00 via INTRAVENOUS

## 2016-07-07 NOTE — Patient Instructions (Signed)
[facility] Discharge Instructions for Patients Receiving Chemotherapy  Today you received the following chemotherapy agents Etoposide.   To help prevent nausea and vomiting after your treatment, we encourage you to take your nausea medication as prescribed.   If you develop nausea and vomiting that is not controlled by your nausea medication, call the clinic.   BELOW ARE SYMPTOMS THAT SHOULD BE REPORTED IMMEDIATELY:  *FEVER GREATER THAN 100.5 F  *CHILLS WITH OR WITHOUT FEVER  NAUSEA AND VOMITING THAT IS NOT CONTROLLED WITH YOUR NAUSEA MEDICATION  *UNUSUAL SHORTNESS OF BREATH  *UNUSUAL BRUISING OR BLEEDING  TENDERNESS IN MOUTH AND THROAT WITH OR WITHOUT PRESENCE OF ULCERS  *URINARY PROBLEMS  *BOWEL PROBLEMS  UNUSUAL RASH Items with * indicate a potential emergency and should be followed up as soon as possible.  Feel free to call the clinic you have any questions or concerns. The clinic phone number is [phone].  Please show the CHEMO ALERT CARD at check-in to the Emergency Department and triage nurse.   

## 2016-07-08 ENCOUNTER — Ambulatory Visit (HOSPITAL_BASED_OUTPATIENT_CLINIC_OR_DEPARTMENT_OTHER): Payer: No Typology Code available for payment source

## 2016-07-08 VITALS — BP 105/68 | HR 84 | Temp 98.7°F | Resp 18

## 2016-07-08 DIAGNOSIS — C3491 Malignant neoplasm of unspecified part of right bronchus or lung: Secondary | ICD-10-CM

## 2016-07-08 DIAGNOSIS — C7951 Secondary malignant neoplasm of bone: Secondary | ICD-10-CM

## 2016-07-08 DIAGNOSIS — C3411 Malignant neoplasm of upper lobe, right bronchus or lung: Secondary | ICD-10-CM | POA: Diagnosis not present

## 2016-07-08 DIAGNOSIS — Z5111 Encounter for antineoplastic chemotherapy: Secondary | ICD-10-CM | POA: Diagnosis not present

## 2016-07-08 MED ORDER — DEXAMETHASONE SODIUM PHOSPHATE 10 MG/ML IJ SOLN
10.0000 mg | Freq: Once | INTRAMUSCULAR | Status: AC
  Administered 2016-07-08: 10 mg via INTRAVENOUS

## 2016-07-08 MED ORDER — SODIUM CHLORIDE 0.9 % IV SOLN
Freq: Once | INTRAVENOUS | Status: AC
  Administered 2016-07-08: 14:00:00 via INTRAVENOUS

## 2016-07-08 MED ORDER — ETOPOSIDE CHEMO INJECTION 1 GM/50ML
100.0000 mg/m2 | Freq: Once | INTRAVENOUS | Status: AC
  Administered 2016-07-08: 170 mg via INTRAVENOUS
  Filled 2016-07-08: qty 8.5

## 2016-07-08 MED ORDER — DEXAMETHASONE SODIUM PHOSPHATE 10 MG/ML IJ SOLN
INTRAMUSCULAR | Status: AC
  Filled 2016-07-08: qty 1

## 2016-07-08 NOTE — Patient Instructions (Signed)
[facility] Discharge Instructions for Patients Receiving Chemotherapy  Today you received the following chemotherapy agents:  Etoposide  To help prevent nausea and vomiting after your treatment, we encourage you to take your nausea medication.   If you develop nausea and vomiting that is not controlled by your nausea medication, call the clinic.   BELOW ARE SYMPTOMS THAT SHOULD BE REPORTED IMMEDIATELY:  *FEVER GREATER THAN 100.5 F  *CHILLS WITH OR WITHOUT FEVER  NAUSEA AND VOMITING THAT IS NOT CONTROLLED WITH YOUR NAUSEA MEDICATION  *UNUSUAL SHORTNESS OF BREATH  *UNUSUAL BRUISING OR BLEEDING  TENDERNESS IN MOUTH AND THROAT WITH OR WITHOUT PRESENCE OF ULCERS  *URINARY PROBLEMS  *BOWEL PROBLEMS  UNUSUAL RASH Items with * indicate a potential emergency and should be followed up as soon as possible.  Feel free to call the clinic you have any questions or concerns. The clinic phone number is [phone].  Please show the CHEMO ALERT CARD at check-in to the Emergency Department and triage nurse.   

## 2016-07-10 ENCOUNTER — Ambulatory Visit (HOSPITAL_BASED_OUTPATIENT_CLINIC_OR_DEPARTMENT_OTHER): Payer: No Typology Code available for payment source

## 2016-07-10 VITALS — BP 149/95 | HR 121 | Temp 97.7°F | Resp 20

## 2016-07-10 DIAGNOSIS — C3411 Malignant neoplasm of upper lobe, right bronchus or lung: Secondary | ICD-10-CM | POA: Diagnosis not present

## 2016-07-10 DIAGNOSIS — Z5189 Encounter for other specified aftercare: Secondary | ICD-10-CM

## 2016-07-10 DIAGNOSIS — C3491 Malignant neoplasm of unspecified part of right bronchus or lung: Secondary | ICD-10-CM

## 2016-07-10 MED ORDER — PEGFILGRASTIM INJECTION 6 MG/0.6ML ~~LOC~~
6.0000 mg | PREFILLED_SYRINGE | Freq: Once | SUBCUTANEOUS | Status: AC
  Administered 2016-07-10: 6 mg via SUBCUTANEOUS

## 2016-07-10 NOTE — Progress Notes (Signed)
Dr. Julien Nordmann aware of VS prior to patient leaving ; pt encouraged to take in more oral fluids  -she stated she has not been drinking as much but agreed she can handle doing this at home. She is alert and her family is present and they agree she will do this at home. She reported feeling more fatigue and "weaker in my legs". She remains ambulatory and was also educated if anything worsens or she develops new symptoms she should seek medical care over the weekend if needed. She is aware the Neulasta will further enhance soreness in her legs possibly and she is taking the recommended medication at home for that already.

## 2016-07-10 NOTE — Patient Instructions (Signed)
Pegfilgrastim injection What is this medicine? PEGFILGRASTIM (PEG fil gra stim) is a long-acting granulocyte colony-stimulating factor that stimulates the growth of neutrophils, a type of white blood cell important in the body's fight against infection. It is used to reduce the incidence of fever and infection in patients with certain types of cancer who are receiving chemotherapy that affects the bone marrow, and to increase survival after being exposed to high doses of radiation. This medicine may be used for other purposes; ask your health care provider or pharmacist if you have questions. COMMON BRAND NAME(S): Neulasta What should I tell my health care provider before I take this medicine? They need to know if you have any of these conditions: -kidney disease -latex allergy -ongoing radiation therapy -sickle cell disease -skin reactions to acrylic adhesives (On-Body Injector only) -an unusual or allergic reaction to pegfilgrastim, filgrastim, other medicines, foods, dyes, or preservatives -pregnant or trying to get pregnant -breast-feeding How should I use this medicine? This medicine is for injection under the skin. If you get this medicine at home, you will be taught how to prepare and give the pre-filled syringe or how to use the On-body Injector. Refer to the patient Instructions for Use for detailed instructions. Use exactly as directed. Take your medicine at regular intervals. Do not take your medicine more often than directed. It is important that you put your used needles and syringes in a special sharps container. Do not put them in a trash can. If you do not have a sharps container, call your pharmacist or healthcare provider to get one. Talk to your pediatrician regarding the use of this medicine in children. While this drug may be prescribed for selected conditions, precautions do apply. Overdosage: If you think you have taken too much of this medicine contact a poison control  center or emergency room at once. NOTE: This medicine is only for you. Do not share this medicine with others. What if I miss a dose? It is important not to miss your dose. Call your doctor or health care professional if you miss your dose. If you miss a dose due to an On-body Injector failure or leakage, a new dose should be administered as soon as possible using a single prefilled syringe for manual use. What may interact with this medicine? Interactions have not been studied. Give your health care provider a list of all the medicines, herbs, non-prescription drugs, or dietary supplements you use. Also tell them if you smoke, drink alcohol, or use illegal drugs. Some items may interact with your medicine. This list may not describe all possible interactions. Give your health care provider a list of all the medicines, herbs, non-prescription drugs, or dietary supplements you use. Also tell them if you smoke, drink alcohol, or use illegal drugs. Some items may interact with your medicine. What should I watch for while using this medicine? You may need blood work done while you are taking this medicine. If you are going to need a MRI, CT scan, or other procedure, tell your doctor that you are using this medicine (On-Body Injector only). What side effects may I notice from receiving this medicine? Side effects that you should report to your doctor or health care professional as soon as possible: -allergic reactions like skin rash, itching or hives, swelling of the face, lips, or tongue -dizziness -fever -pain, redness, or irritation at site where injected -pinpoint red spots on the skin -red or dark-brown urine -shortness of breath or breathing problems -stomach or   side pain, or pain at the shoulder -swelling -tiredness -trouble passing urine or change in the amount of urine Side effects that usually do not require medical attention (report to your doctor or health care professional if they  continue or are bothersome): -bone pain -muscle pain This list may not describe all possible side effects. Call your doctor for medical advice about side effects. You may report side effects to FDA at 1-800-FDA-1088. Where should I keep my medicine? Keep out of the reach of children. Store pre-filled syringes in a refrigerator between 2 and 8 degrees C (36 and 46 degrees F). Do not freeze. Keep in carton to protect from light. Throw away this medicine if it is left out of the refrigerator for more than 48 hours. Throw away any unused medicine after the expiration date. NOTE: This sheet is a summary. It may not cover all possible information. If you have questions about this medicine, talk to your doctor, pharmacist, or health care provider.  2017 Elsevier/Gold Standard (2014-06-20 14:30:14)  

## 2016-07-13 ENCOUNTER — Other Ambulatory Visit (HOSPITAL_BASED_OUTPATIENT_CLINIC_OR_DEPARTMENT_OTHER): Payer: No Typology Code available for payment source

## 2016-07-13 DIAGNOSIS — C3491 Malignant neoplasm of unspecified part of right bronchus or lung: Secondary | ICD-10-CM

## 2016-07-13 DIAGNOSIS — C3411 Malignant neoplasm of upper lobe, right bronchus or lung: Secondary | ICD-10-CM

## 2016-07-13 LAB — COMPREHENSIVE METABOLIC PANEL WITH GFR
ALT: 33 U/L (ref 0–55)
AST: 17 U/L (ref 5–34)
Albumin: 4 g/dL (ref 3.5–5.0)
Alkaline Phosphatase: 119 U/L (ref 40–150)
Anion Gap: 10 meq/L (ref 3–11)
BUN: 9.4 mg/dL (ref 7.0–26.0)
CO2: 28 meq/L (ref 22–29)
Calcium: 9.5 mg/dL (ref 8.4–10.4)
Chloride: 93 meq/L — ABNORMAL LOW (ref 98–109)
Creatinine: 0.6 mg/dL (ref 0.6–1.1)
EGFR: >90 ml/min/1.73 m2
Glucose: 97 mg/dL (ref 70–140)
Potassium: 3.6 meq/L (ref 3.5–5.1)
Sodium: 131 meq/L — ABNORMAL LOW (ref 136–145)
Total Bilirubin: 0.43 mg/dL (ref 0.20–1.20)
Total Protein: 6.8 g/dL (ref 6.4–8.3)

## 2016-07-13 LAB — CBC WITH DIFFERENTIAL/PLATELET
BASO%: 0.7 % (ref 0.0–2.0)
BASOS ABS: 0.2 10*3/uL — AB (ref 0.0–0.1)
EOS%: 0.1 % (ref 0.0–7.0)
Eosinophils Absolute: 0 10*3/uL (ref 0.0–0.5)
HCT: 32.2 % — ABNORMAL LOW (ref 34.8–46.6)
HEMOGLOBIN: 11.3 g/dL — AB (ref 11.6–15.9)
LYMPH#: 1.4 10*3/uL (ref 0.9–3.3)
LYMPH%: 5.6 % — ABNORMAL LOW (ref 14.0–49.7)
MCH: 33 pg (ref 25.1–34.0)
MCHC: 35 g/dL (ref 31.5–36.0)
MCV: 94.2 fL (ref 79.5–101.0)
MONO#: 0.9 10*3/uL (ref 0.1–0.9)
MONO%: 3.4 % (ref 0.0–14.0)
NEUT%: 90.2 % — ABNORMAL HIGH (ref 38.4–76.8)
NEUTROS ABS: 22.9 10*3/uL — AB (ref 1.5–6.5)
Platelets: 255 10*3/uL (ref 145–400)
RBC: 3.42 10*6/uL — ABNORMAL LOW (ref 3.70–5.45)
RDW: 15.9 % — AB (ref 11.2–14.5)
WBC: 25.4 10*3/uL — AB (ref 3.9–10.3)

## 2016-07-19 ENCOUNTER — Other Ambulatory Visit: Payer: Self-pay | Admitting: *Deleted

## 2016-07-19 DIAGNOSIS — C3491 Malignant neoplasm of unspecified part of right bronchus or lung: Secondary | ICD-10-CM

## 2016-07-20 ENCOUNTER — Other Ambulatory Visit: Payer: Self-pay | Admitting: Medical Oncology

## 2016-07-20 ENCOUNTER — Other Ambulatory Visit (HOSPITAL_BASED_OUTPATIENT_CLINIC_OR_DEPARTMENT_OTHER): Payer: No Typology Code available for payment source

## 2016-07-20 DIAGNOSIS — C3411 Malignant neoplasm of upper lobe, right bronchus or lung: Secondary | ICD-10-CM

## 2016-07-20 DIAGNOSIS — C3491 Malignant neoplasm of unspecified part of right bronchus or lung: Secondary | ICD-10-CM

## 2016-07-20 LAB — CBC WITH DIFFERENTIAL/PLATELET
BASO%: 3.7 % — AB (ref 0.0–2.0)
Basophils Absolute: 0.6 10*3/uL — ABNORMAL HIGH (ref 0.0–0.1)
EOS ABS: 0 10*3/uL (ref 0.0–0.5)
EOS%: 0.1 % (ref 0.0–7.0)
HEMATOCRIT: 34.9 % (ref 34.8–46.6)
HGB: 11.9 g/dL (ref 11.6–15.9)
LYMPH#: 1.2 10*3/uL (ref 0.9–3.3)
LYMPH%: 8.1 % — AB (ref 14.0–49.7)
MCH: 32.6 pg (ref 25.1–34.0)
MCHC: 33.9 g/dL (ref 31.5–36.0)
MCV: 96 fL (ref 79.5–101.0)
MONO#: 0.9 10*3/uL (ref 0.1–0.9)
MONO%: 6.1 % (ref 0.0–14.0)
NEUT%: 82 % — AB (ref 38.4–76.8)
NEUTROS ABS: 12.5 10*3/uL — AB (ref 1.5–6.5)
PLATELETS: 136 10*3/uL — AB (ref 145–400)
RBC: 3.64 10*6/uL — ABNORMAL LOW (ref 3.70–5.45)
RDW: 16.1 % — ABNORMAL HIGH (ref 11.2–14.5)
WBC: 15.2 10*3/uL — ABNORMAL HIGH (ref 3.9–10.3)

## 2016-07-20 LAB — COMPREHENSIVE METABOLIC PANEL
ALT: 56 U/L — AB (ref 0–55)
ANION GAP: 9 meq/L (ref 3–11)
AST: 26 U/L (ref 5–34)
Albumin: 4 g/dL (ref 3.5–5.0)
Alkaline Phosphatase: 121 U/L (ref 40–150)
BUN: 7.8 mg/dL (ref 7.0–26.0)
CALCIUM: 10 mg/dL (ref 8.4–10.4)
CO2: 30 mEq/L — ABNORMAL HIGH (ref 22–29)
CREATININE: 0.7 mg/dL (ref 0.6–1.1)
Chloride: 98 mEq/L (ref 98–109)
Glucose: 114 mg/dl (ref 70–140)
Potassium: 3.8 mEq/L (ref 3.5–5.1)
Sodium: 137 mEq/L (ref 136–145)
TOTAL PROTEIN: 7.1 g/dL (ref 6.4–8.3)

## 2016-07-26 ENCOUNTER — Ambulatory Visit (HOSPITAL_COMMUNITY)
Admission: RE | Admit: 2016-07-26 | Discharge: 2016-07-26 | Disposition: A | Discharge status: Home / Self Care | Payer: No Typology Code available for payment source | Source: Ambulatory Visit | Attending: Internal Medicine | Admitting: Internal Medicine

## 2016-07-26 ENCOUNTER — Encounter (HOSPITAL_COMMUNITY): Payer: Self-pay

## 2016-07-26 DIAGNOSIS — C7951 Secondary malignant neoplasm of bone: Secondary | ICD-10-CM | POA: Insufficient documentation

## 2016-07-26 DIAGNOSIS — D3501 Benign neoplasm of right adrenal gland: Secondary | ICD-10-CM | POA: Insufficient documentation

## 2016-07-26 DIAGNOSIS — C3491 Malignant neoplasm of unspecified part of right bronchus or lung: Secondary | ICD-10-CM | POA: Insufficient documentation

## 2016-07-26 DIAGNOSIS — Z5111 Encounter for antineoplastic chemotherapy: Secondary | ICD-10-CM | POA: Diagnosis not present

## 2016-07-26 HISTORY — DX: Malignant (primary) neoplasm, unspecified: C80.1

## 2016-07-26 MED ORDER — SODIUM CHLORIDE 0.9 % IJ SOLN
INTRAMUSCULAR | Status: AC
  Filled 2016-07-26: qty 50

## 2016-07-26 MED ORDER — IOPAMIDOL (ISOVUE-300) INJECTION 61%
100.0000 mL | Freq: Once | INTRAVENOUS | Status: AC | PRN
  Administered 2016-07-26: 100 mL via INTRAVENOUS

## 2016-07-26 MED ORDER — IOPAMIDOL (ISOVUE-300) INJECTION 61%
INTRAVENOUS | Status: AC
  Filled 2016-07-26: qty 100

## 2016-07-27 ENCOUNTER — Telehealth: Payer: Self-pay | Admitting: Internal Medicine

## 2016-07-27 ENCOUNTER — Encounter: Payer: Self-pay | Admitting: Internal Medicine

## 2016-07-27 ENCOUNTER — Ambulatory Visit (HOSPITAL_BASED_OUTPATIENT_CLINIC_OR_DEPARTMENT_OTHER): Payer: No Typology Code available for payment source | Admitting: Internal Medicine

## 2016-07-27 ENCOUNTER — Other Ambulatory Visit (HOSPITAL_BASED_OUTPATIENT_CLINIC_OR_DEPARTMENT_OTHER): Payer: No Typology Code available for payment source

## 2016-07-27 VITALS — BP 120/78 | HR 74 | Temp 98.0°F | Resp 17 | Ht 62.0 in | Wt 156.6 lb

## 2016-07-27 DIAGNOSIS — R05 Cough: Secondary | ICD-10-CM

## 2016-07-27 DIAGNOSIS — C3491 Malignant neoplasm of unspecified part of right bronchus or lung: Secondary | ICD-10-CM

## 2016-07-27 DIAGNOSIS — C3411 Malignant neoplasm of upper lobe, right bronchus or lung: Secondary | ICD-10-CM

## 2016-07-27 DIAGNOSIS — I10 Essential (primary) hypertension: Secondary | ICD-10-CM

## 2016-07-27 DIAGNOSIS — Z5111 Encounter for antineoplastic chemotherapy: Secondary | ICD-10-CM

## 2016-07-27 DIAGNOSIS — C7951 Secondary malignant neoplasm of bone: Secondary | ICD-10-CM

## 2016-07-27 DIAGNOSIS — G4701 Insomnia due to medical condition: Secondary | ICD-10-CM

## 2016-07-27 LAB — COMPREHENSIVE METABOLIC PANEL
ALBUMIN: 3.9 g/dL (ref 3.5–5.0)
ALK PHOS: 94 U/L (ref 40–150)
ALT: 31 U/L (ref 0–55)
AST: 15 U/L (ref 5–34)
Anion Gap: 11 mEq/L (ref 3–11)
BUN: 8 mg/dL (ref 7.0–26.0)
CALCIUM: 9.9 mg/dL (ref 8.4–10.4)
CHLORIDE: 98 meq/L (ref 98–109)
CO2: 28 mEq/L (ref 22–29)
CREATININE: 0.7 mg/dL (ref 0.6–1.1)
EGFR: >90 mL/min/{1.73_m2} (ref >90)
Glucose: 97 mg/dl (ref 70–140)
Potassium: 3.5 mEq/L (ref 3.5–5.1)
Sodium: 137 mEq/L (ref 136–145)
Total Bilirubin: 0.22 mg/dL (ref 0.20–1.20)
Total Protein: 7 g/dL (ref 6.4–8.3)

## 2016-07-27 MED ORDER — BENZONATATE 100 MG PO CAPS: 100.0000 mg | ORAL_CAPSULE | Freq: Three times a day (TID) | ORAL | 0 refills | Status: DC | PRN

## 2016-07-27 NOTE — Telephone Encounter (Signed)
Appointment scheduled with Dr Manning/Rad/Onc for 08/05/16. Patient is aware of appointment. Appointments scheduled per 07/27/16 los. Patient was given 2 bottles of contrast,per CT ordered and a copy of the AVS report and appointment schedule, per 07/27/16 los.

## 2016-07-27 NOTE — Progress Notes (Signed)
[facility] [phone]   [phone]  OFFICE PROGRESS NOTE  [name], PA-C [place] [address]  DIAGNOSIS: Extensive stage (T1b, N3, M1b) small cell lung cancer presented with right upper lobe lung mass, massive mediastinal lymphadenopathy and metastatic bone lesions diagnosed in October 2017.  PRIOR THERAPY:  1) Palliative radiotherapy to the large mediastinal mass under the care of Dr. Tammi Klippel. 2) systemic chemotherapy with carboplatin for AUC of 5 on day 1 and etoposide 100 MG/M2 on days 1, 2 and 3 with Neulasta support, status post 6 cycles. Last dose was given 07/06/2016.  CURRENT THERAPY: Observation.  INTERVAL HISTORY: [name] 66 y.o. female came to the clinic today accompanied by her son and interpreter. The patient tolerated the last cycle of her systemic chemotherapy with carboplatin and etoposide fairly well except for fatigue and aching pain after the Neulasta injection. She denied having any chest pain, shortness breath, cough or hemoptysis. She has no fever or chills. She denied having any nausea, vomiting, diarrhea or constipation. She has no significant weight loss or night sweats. The patient had repeat CT scan of the chest, abdomen and pelvis performed recently and she is here for evaluation and discussion of her scan results.  MEDICAL HISTORY: Past Medical History:  Diagnosis Date  . Anemia    in the past  . Arthritis   . Depression   . Encounter for antineoplastic chemotherapy 03/30/2016  . Headache   . Hyperlipidemia   . Hypertension   . Insomnia 06/15/2016  . Lung mass 03/12/2016  . PONV (postoperative nausea and vomiting)    after hysterectomy  . SCL CA dx'd 02/2016  . Shortness of breath dyspnea    with exertion    ALLERGIES:  is allergic to penicillins.  MEDICATIONS:  Current Outpatient Prescriptions  Medication Sig Dispense Refill  . atorvastatin (LIPITOR) 40 MG tablet Take 40 mg by  mouth at bedtime.   0  . benzonatate (TESSALON) 100 MG capsule Take 1-2 capsules (100-200 mg total) by mouth 3 (three) times daily as needed for cough. 40 capsule 0  . hydrochlorothiazide (HYDRODIURIL) 25 MG tablet Take 1 tablet (25 mg total) by mouth daily. 90 tablet 1  . oxyCODONE-acetaminophen (PERCOCET/ROXICET) 5-325 MG tablet Take 1 tablet by mouth every 6 (six) hours as needed for severe pain. 30 tablet 0  . prochlorperazine (COMPAZINE) 10 MG tablet Take 1 tablet (10 mg total) by mouth every 6 (six) hours as needed for nausea or vomiting. 30 tablet 0  . temazepam (RESTORIL) 30 MG capsule Take 1 capsule (30 mg total) by mouth at bedtime as needed for sleep. 30 capsule 0   No current facility-administered medications for this visit.     SURGICAL HISTORY:  Past Surgical History:  Procedure Laterality Date  . ABDOMINAL HYSTERECTOMY     2009 , due to fobroids, benign path for uterus, tumor and also cervix.   [facility] BACK SURGERY     lower back  . COLONOSCOPY    . VIDEO BRONCHOSCOPY WITH ENDOBRONCHIAL ULTRASOUND N/A 03/15/2016   Procedure: VIDEO BRONCHOSCOPY WITH ENDOBRONCHIAL ULTRASOUND AND BIOPSY OF LEVEL SEVEN LYMPH NODE;  Surgeon: Grace Isaac, MD;  Location: [facility];  Service: Thoracic;  Laterality: N/A;    REVIEW OF SYSTEMS:  Constitutional: positive for fatigue Eyes: negative Ears, nose, mouth, throat, and face: negative Respiratory: negative Cardiovascular: negative Gastrointestinal: negative Genitourinary:negative Integument/breast: negative Hematologic/lymphatic: negative Musculoskeletal:positive for arthralgias Neurological: negative Behavioral/Psych: negative  Endocrine: negative Allergic/Immunologic: negative   PHYSICAL EXAMINATION: General appearance: alert, cooperative, fatigued and no distress Head: Normocephalic, without obvious abnormality, atraumatic Neck: no adenopathy, no JVD, supple, symmetrical, trachea midline and thyroid not enlarged, symmetric, no  tenderness/mass/nodules Lymph nodes: Cervical, supraclavicular, and axillary nodes normal. Resp: clear to auscultation bilaterally Back: symmetric, no curvature. ROM normal. No CVA tenderness. Cardio: regular rate and rhythm, S1, S2 normal, no murmur, click, rub or gallop GI: soft, non-tender; bowel sounds normal; no masses,  no organomegaly Extremities: extremities normal, atraumatic, no cyanosis or edema Neurologic: Alert and oriented X 3, normal strength and tone. Normal symmetric reflexes. Normal coordination and gait  ECOG PERFORMANCE STATUS: 1 - Symptomatic but completely ambulatory  Blood pressure 120/78, pulse 74, temperature 98 F (36.7 C), temperature source Oral, resp. rate 17, height '5\' 2"'$  (1.575 m), weight 156 lb 9.6 oz (71 kg), SpO2 99 %.  LABORATORY DATA: Lab Results  Component Value Date   WBC 15.2 (H) 07/20/2016   HGB 11.9 07/20/2016   HCT 34.9 07/20/2016   MCV 96.0 07/20/2016   PLT 136 (L) 07/20/2016      Chemistry      Component Value Date/Time   NA 137 07/27/2016 0958   K 3.5 07/27/2016 0958   CL 98 (L) 03/15/2016 1325   CO2 28 07/27/2016 0958   BUN 8.0 07/27/2016 0958   CREATININE 0.7 07/27/2016 0958      Component Value Date/Time   CALCIUM 9.9 07/27/2016 0958   ALKPHOS 94 07/27/2016 0958   AST 15 07/27/2016 0958   ALT 31 07/27/2016 0958   BILITOT 0.22 07/27/2016 0958       RADIOGRAPHIC STUDIES: Ct Chest W Contrast  Result Date: 07/26/2016 CLINICAL DATA:  Lung cancer EXAM: CT CHEST, ABDOMEN, AND PELVIS WITH CONTRAST TECHNIQUE: Multidetector CT imaging of the chest, abdomen and pelvis was performed following the standard protocol during bolus administration of intravenous contrast. CONTRAST:  171m ISOVUE-300 IOPAMIDOL (ISOVUE-300) INJECTION 61% COMPARISON:  06/08/2016 FINDINGS: CT CHEST FINDINGS Cardiovascular: The heart size appears normal. No pericardial effusion identified. Aortic atherosclerosis. Calcification in the LAD coronary artery noted.  Mediastinum/Nodes: The trachea appears patent and is midline. Normal appearance of the esophagus. Right paratracheal lymph node measures 1.2 cm, image 21 of series 2. Previously 1.3 cm. Resolution of previous right hilar adenopathy. Lungs/Pleura: No pleural fluid. The index nodule within the right upper lobe measures 6 mm, image number 62 of series 6. Previously 8 mm. The adjacent peribronchovascular nodule in the right upper lobe also measures 6 mm, image 67 of series 6. Previously 8 mm. No new or enlarging pulmonary nodules or masses. Musculoskeletal: Multiple sclerotic bone metastases are again noted and appear not significantly changed in the interval. CT ABDOMEN PELVIS FINDINGS Hepatobiliary: Stable 8 mm low density structure within the posterior right lobe of liver. The gallbladder appears normal. No biliary dilatation. Pancreas: Unremarkable. No pancreatic ductal dilatation or surrounding inflammatory changes. Spleen: Normal in size without focal abnormality. Adrenals/Urinary Tract: Right adrenal nodule is stable measuring 3.5 cm, image 58 of series 2. Stable nodular enlargement of the left adrenal gland, image 55 of series 2. No kidney mass or hydronephrosis identified. The urinary bladder appears normal. Stomach/Bowel: The stomach is normal. The small bowel loops have a normal course and caliber. The appendix is visualized and appears normal. Vascular/Lymphatic: Aortic atherosclerosis. No enlarged abdominal or pelvic lymph nodes. Reproductive: Status post hysterectomy. No adnexal masses. Other: No abdominal wall hernia or abnormality. No abdominopelvic ascites. Musculoskeletal: Multifocal sclerotic  bone metastases are again identified. Similar when compared with 04/30/2016. IMPRESSION: 1. Stable to decrease in size of right paratracheal adenopathy. These small nodules in the right upper lobe also demonstrates mild decrease in size in the interval. No new or progressive disease identified. 2. Multifocal  sclerotic bone metastases are stable from 04/30/2016. 3. Stable right adrenal lesion previously characterized as adenoma. Electronically Signed   By: Kerby Moors M.D.   On: 07/26/2016 17:04   Ct Abdomen Pelvis W Contrast  Result Date: 07/26/2016 CLINICAL DATA:  Lung cancer EXAM: CT CHEST, ABDOMEN, AND PELVIS WITH CONTRAST TECHNIQUE: Multidetector CT imaging of the chest, abdomen and pelvis was performed following the standard protocol during bolus administration of intravenous contrast. CONTRAST:  146m ISOVUE-300 IOPAMIDOL (ISOVUE-300) INJECTION 61% COMPARISON:  06/08/2016 FINDINGS: CT CHEST FINDINGS Cardiovascular: The heart size appears normal. No pericardial effusion identified. Aortic atherosclerosis. Calcification in the LAD coronary artery noted. Mediastinum/Nodes: The trachea appears patent and is midline. Normal appearance of the esophagus. Right paratracheal lymph node measures 1.2 cm, image 21 of series 2. Previously 1.3 cm. Resolution of previous right hilar adenopathy. Lungs/Pleura: No pleural fluid. The index nodule within the right upper lobe measures 6 mm, image number 62 of series 6. Previously 8 mm. The adjacent peribronchovascular nodule in the right upper lobe also measures 6 mm, image 67 of series 6. Previously 8 mm. No new or enlarging pulmonary nodules or masses. Musculoskeletal: Multiple sclerotic bone metastases are again noted and appear not significantly changed in the interval. CT ABDOMEN PELVIS FINDINGS Hepatobiliary: Stable 8 mm low density structure within the posterior right lobe of liver. The gallbladder appears normal. No biliary dilatation. Pancreas: Unremarkable. No pancreatic ductal dilatation or surrounding inflammatory changes. Spleen: Normal in size without focal abnormality. Adrenals/Urinary Tract: Right adrenal nodule is stable measuring 3.5 cm, image 58 of series 2. Stable nodular enlargement of the left adrenal gland, image 55 of series 2. No kidney mass or  hydronephrosis identified. The urinary bladder appears normal. Stomach/Bowel: The stomach is normal. The small bowel loops have a normal course and caliber. The appendix is visualized and appears normal. Vascular/Lymphatic: Aortic atherosclerosis. No enlarged abdominal or pelvic lymph nodes. Reproductive: Status post hysterectomy. No adnexal masses. Other: No abdominal wall hernia or abnormality. No abdominopelvic ascites. Musculoskeletal: Multifocal sclerotic bone metastases are again identified. Similar when compared with 04/30/2016. IMPRESSION: 1. Stable to decrease in size of right paratracheal adenopathy. These small nodules in the right upper lobe also demonstrates mild decrease in size in the interval. No new or progressive disease identified. 2. Multifocal sclerotic bone metastases are stable from 04/30/2016. 3. Stable right adrenal lesion previously characterized as adenoma. Electronically Signed   By: TKerby MoorsM.D.   On: 07/26/2016 17:04    ASSESSMENT AND PLAN:  This is a very pleasant 66years old white female with extensive stage small cell lung cancer status post short course of palliative radiotherapy followed by 6 cycles of systemic chemotherapy with carboplatin and etoposide. The patient tolerated her systemic chemotherapy fairly well except for fatigue and aching pain after the Neulasta injection. She had repeat CT scan of the chest, abdomen and pelvis performed recently. I personally and independently reviewed the scan images and discuss the results and showed the images to the patient and her son today. Her scan showed further improvement in her disease with very small residual disease in the chest. I recommended for the patient to continue on observation for now with repeat CT scan of the chest,  abdomen and pelvis in 3 months for restaging of her disease. I also discussed with the patient referral to Dr. Tammi Klippel for consideration of prophylactic cranial irradiation. For cough, I  gave her refill for Tessalon. For pain management, the patient will continue on Percocet 5/325 mg as needed. For insomnia and she will continue on Restoril for now. For hypertension the patient will continue her current treatment with hydrochlorothiazide. She was advised to call immediately if she has any concerning symptoms in the interval. The patient voices understanding of current disease status and treatment options and is in agreement with the current care plan.  All questions were answered. The patient knows to call the clinic with any problems, questions or concerns. We can certainly see the patient much sooner if necessary.  Disclaimer: This note was dictated with voice recognition software. Similar sounding words can inadvertently be transcribed and may not be corrected upon review.

## 2016-08-05 ENCOUNTER — Ambulatory Visit
Admission: RE | Admit: 2016-08-05 | Discharge: 2016-08-05 | Disposition: A | Discharge status: Home / Self Care | Payer: No Typology Code available for payment source | Source: Ambulatory Visit | Attending: Radiation Oncology | Admitting: Radiation Oncology

## 2016-08-05 ENCOUNTER — Encounter: Payer: Self-pay | Admitting: Radiation Oncology

## 2016-08-05 ENCOUNTER — Telehealth: Payer: Self-pay | Admitting: *Deleted

## 2016-08-05 VITALS — BP 120/85 | HR 91 | Temp 99.1°F | Resp 18 | Ht 65.0 in | Wt 156.1 lb

## 2016-08-05 DIAGNOSIS — Z510 Encounter for antineoplastic radiation therapy: Secondary | ICD-10-CM | POA: Diagnosis not present

## 2016-08-05 DIAGNOSIS — C7931 Secondary malignant neoplasm of brain: Secondary | ICD-10-CM | POA: Insufficient documentation

## 2016-08-05 DIAGNOSIS — F411 Generalized anxiety disorder: Secondary | ICD-10-CM

## 2016-08-05 DIAGNOSIS — C3491 Malignant neoplasm of unspecified part of right bronchus or lung: Secondary | ICD-10-CM | POA: Insufficient documentation

## 2016-08-05 MED ORDER — LORAZEPAM 1 MG PO TABS: 1.0000 mg | ORAL_TABLET | Freq: Three times a day (TID) | ORAL | 0 refills | Status: DC

## 2016-08-05 NOTE — Progress Notes (Signed)
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  Narrative:  The patient returns today for re-consultation. She is referred back to the clinic today for consideration of prophylactic cranial irradiation.                    The patient reports intermittent episodes of nausea that have increased in frequency but resolve with Compazine. Reports intermittent headaches. Reports intermittent low back pain. Reports intermittent blurry vision. Denies diplopia. Reports her gait is intermittently unsteady. Denies pain or difficulty swallowing, but does constantly feel like she has something in her throat. Reports a productive cough in the morning with clear to brown sputum. Denies shortness of breath. She is accompanied today by her son and an interpretor.   PRIOR RADIATION THERAPY: Yes 03/18/16-03/31/16: Chest was treated to 30 Gy in 10 fractions of 3 Gy  ALLERGIES:  is allergic to penicillins.  Meds: Current Outpatient Prescriptions  Medication Sig Dispense Refill  . acetaminophen (TYLENOL) 325 MG tablet Take 650 mg by mouth every 6 (six) hours as needed.    Marland Kitchen atorvastatin (LIPITOR) 40 MG tablet Take 40 mg by mouth at bedtime.   0  . benzonatate (TESSALON) 100 MG capsule Take 1-2 capsules (100-200 mg total) by mouth 3 (three) times daily as needed for cough. 40 capsule 0  . hydrochlorothiazide (HYDRODIURIL) 25 MG tablet Take 1 tablet (25 mg total) by mouth daily. 90 tablet 1  . prochlorperazine (COMPAZINE) 10 MG tablet Take 1 tablet (10 mg total) by mouth every 6 (six) hours as needed for nausea or vomiting. 30 tablet 0  . temazepam (RESTORIL) 30 MG capsule Take 1 capsule (30 mg total)  by mouth at bedtime as needed for sleep. 30 capsule 0  . oxyCODONE-acetaminophen (PERCOCET/ROXICET) 5-325 MG tablet Take 1 tablet by mouth every 6 (six) hours as needed for severe pain. (Patient not taking: Reported on 08/05/2016) 30 tablet 0   No current facility-administered medications for this encounter.     Physical Findings:  height is '5\' 5"'$  (1.651 m) and weight is 156 lb 1.6 oz (70.8 kg). Her oral temperature is 99.1 F (37.3 C). Her blood pressure is 120/85 and her pulse is 91. Her respiration is 18 and oxygen saturation is 98%.   In general this is a well appearing [place] european female in no acute distress. She's alert and oriented x4 and appropriate throughout the examination. Cardiopulmonary assessment reveals a RRR, no C/R/M are noted, and chest is CTAB.   Lab Findings: Lab Results  Component Value Date   WBC 15.2 (H) 07/20/2016   HGB 11.9 07/20/2016   HCT 34.9 07/20/2016   MCV 96.0 07/20/2016   PLT 136 (L) 07/20/2016     Radiographic Findings: Ct Chest W Contrast  Result Date: 07/26/2016 CLINICAL DATA:  Lung cancer EXAM: CT CHEST, ABDOMEN, AND PELVIS WITH CONTRAST TECHNIQUE: Multidetector CT imaging of the chest, abdomen and pelvis was performed following the standard protocol during bolus administration of intravenous contrast. CONTRAST:  151m ISOVUE-300 IOPAMIDOL (ISOVUE-300) INJECTION 61% COMPARISON:  06/08/2016 FINDINGS: CT CHEST FINDINGS Cardiovascular: The heart size appears normal. No pericardial effusion identified. Aortic atherosclerosis. Calcification in  the LAD coronary artery noted. Mediastinum/Nodes: The trachea appears patent and is midline. Normal appearance of the esophagus. Right paratracheal lymph node measures 1.2 cm, image 21 of series 2. Previously 1.3 cm. Resolution of previous right hilar adenopathy. Lungs/Pleura: No pleural fluid. The index nodule within the right upper lobe measures 6 mm, image number 62 of series 6. Previously 8 mm. The adjacent  peribronchovascular nodule in the right upper lobe also measures 6 mm, image 67 of series 6. Previously 8 mm. No new or enlarging pulmonary nodules or masses. Musculoskeletal: Multiple sclerotic bone metastases are again noted and appear not significantly changed in the interval. CT ABDOMEN PELVIS FINDINGS Hepatobiliary: Stable 8 mm low density structure within the posterior right lobe of liver. The gallbladder appears normal. No biliary dilatation. Pancreas: Unremarkable. No pancreatic ductal dilatation or surrounding inflammatory changes. Spleen: Normal in size without focal abnormality. Adrenals/Urinary Tract: Right adrenal nodule is stable measuring 3.5 cm, image 58 of series 2. Stable nodular enlargement of the left adrenal gland, image 55 of series 2. No kidney mass or hydronephrosis identified. The urinary bladder appears normal. Stomach/Bowel: The stomach is normal. The small bowel loops have a normal course and caliber. The appendix is visualized and appears normal. Vascular/Lymphatic: Aortic atherosclerosis. No enlarged abdominal or pelvic lymph nodes. Reproductive: Status post hysterectomy. No adnexal masses. Other: No abdominal wall hernia or abnormality. No abdominopelvic ascites. Musculoskeletal: Multifocal sclerotic bone metastases are again identified. Similar when compared with 04/30/2016. IMPRESSION: 1. Stable to decrease in size of right paratracheal adenopathy. These small nodules in the right upper lobe also demonstrates mild decrease in size in the interval. No new or progressive disease identified. 2. Multifocal sclerotic bone metastases are stable from 04/30/2016. 3. Stable right adrenal lesion previously characterized as adenoma. Electronically Signed   By: Kerby Moors M.D.   On: 07/26/2016 17:04   Ct Abdomen Pelvis W Contrast  Result Date: 07/26/2016 CLINICAL DATA:  Lung cancer EXAM: CT CHEST, ABDOMEN, AND PELVIS WITH CONTRAST TECHNIQUE: Multidetector CT imaging of the chest, abdomen  and pelvis was performed following the standard protocol during bolus administration of intravenous contrast. CONTRAST:  162m ISOVUE-300 IOPAMIDOL (ISOVUE-300) INJECTION 61% COMPARISON:  06/08/2016 FINDINGS: CT CHEST FINDINGS Cardiovascular: The heart size appears normal. No pericardial effusion identified. Aortic atherosclerosis. Calcification in the LAD coronary artery noted. Mediastinum/Nodes: The trachea appears patent and is midline. Normal appearance of the esophagus. Right paratracheal lymph node measures 1.2 cm, image 21 of series 2. Previously 1.3 cm. Resolution of previous right hilar adenopathy. Lungs/Pleura: No pleural fluid. The index nodule within the right upper lobe measures 6 mm, image number 62 of series 6. Previously 8 mm. The adjacent peribronchovascular nodule in the right upper lobe also measures 6 mm, image 67 of series 6. Previously 8 mm. No new or enlarging pulmonary nodules or masses. Musculoskeletal: Multiple sclerotic bone metastases are again noted and appear not significantly changed in the interval. CT ABDOMEN PELVIS FINDINGS Hepatobiliary: Stable 8 mm low density structure within the posterior right lobe of liver. The gallbladder appears normal. No biliary dilatation. Pancreas: Unremarkable. No pancreatic ductal dilatation or surrounding inflammatory changes. Spleen: Normal in size without focal abnormality. Adrenals/Urinary Tract: Right adrenal nodule is stable measuring 3.5 cm, image 58 of series 2. Stable nodular enlargement of the left adrenal gland, image 55 of series 2. No kidney mass or hydronephrosis identified. The urinary bladder appears normal. Stomach/Bowel: The stomach is normal. The small bowel loops have a normal course and caliber. The appendix  is visualized and appears normal. Vascular/Lymphatic: Aortic atherosclerosis. No enlarged abdominal or pelvic lymph nodes. Reproductive: Status post hysterectomy. No adnexal masses. Other: No abdominal wall hernia or  abnormality. No abdominopelvic ascites. Musculoskeletal: Multifocal sclerotic bone metastases are again identified. Similar when compared with 04/30/2016. IMPRESSION: 1. Stable to decrease in size of right paratracheal adenopathy. These small nodules in the right upper lobe also demonstrates mild decrease in size in the interval. No new or progressive disease identified. 2. Multifocal sclerotic bone metastases are stable from 04/30/2016. 3. Stable right adrenal lesion previously characterized as adenoma. Electronically Signed   By: Kerby Moors M.D.   On: 07/26/2016 17:04    Impression/Plan: 1. 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 The patient previously underwent radiotherapy to the chest, and has done very well since treatment.  2. Possible role of PCI. Again we discussed that she had a negative brain MRI scan prior to initiating treatment, however the patient will need another brain MRI to establish baseline before proceeding with PCI. If she has stability of disease we will offer PCI in 10 fractions to reduce her risk of brain metastases. We did also discussed that prophylactic cranial irradiation also improves survival.   She is interested in moving forward with this treatment. She will be scheduled for MRI and CT Simulation and planning.    Nicholos Johns, PA-C    Tyler Pita, MD  [facility] Direct Dial: [phone]  Fax: [phone] [email or web address]  Skype  LinkedIn  _________________________________  This document serves as a record of services personally performed by Tyler Pita, MD and Freeman Caldron, PA-C. It was created on their behalf by Maryla Morrow, a trained medical scribe. The creation of this record is based on the scribe's personal observations and the provider's statements to them. This document has been checked and approved by the attending provider.

## 2016-08-05 NOTE — Progress Notes (Addendum)
Small cell lung ca patient. Hx of radiation and chemotherapy. Referred back for consideration of prophylactic crainial radiation. Weight stable. Low grade fever noted but, all over vitals WDL. Reports intermittent episodes of nausea that have increased in frequency but, resolve with Compazine. Reports intermittent headaches. Reports intermittent low back pain. Reports intermittent blurry vision. Denies diplopia. Reports her gait is intermittently unsteady. Denies pain or difficulty swallowing but, does constantly feel like she has something in her throat. Reports a productive cough in the morning with clear to brown sputum. Denies shortness of breath. Accompanied by her son and an interpretor.   Hx of xrt: Yes Pacemaker: No Pregnant: No Methotrexate: No  BP 120/85 (BP Location: Left Arm, Patient Position: Sitting, Cuff Size: Normal)   Pulse 91   Temp 99.1 F (37.3 C) (Oral)   Resp 18   Ht '5\' 5"'$  (1.651 m)   Wt 156 lb 1.6 oz (70.8 kg)   SpO2 98%   BMI 25.98 kg/m  Wt Readings from Last 3 Encounters:  08/05/16 156 lb 1.6 oz (70.8 kg)  07/27/16 156 lb 9.6 oz (71 kg)  07/06/16 152 lb 4.8 oz (69.1 kg)

## 2016-08-05 NOTE — Telephone Encounter (Signed)
CALLED PATIENT TO INFORM OF SIM APPT. ON 08-20-16 @ 2 PM, SPOKE WITH PATIENT'S SON- ZELJIC AND HE IS AWARE OF THIS APPT.

## 2016-08-10 ENCOUNTER — Other Ambulatory Visit: Payer: Self-pay | Admitting: Urology

## 2016-08-10 DIAGNOSIS — C3491 Malignant neoplasm of unspecified part of right bronchus or lung: Secondary | ICD-10-CM

## 2016-08-13 ENCOUNTER — Ambulatory Visit
Admission: RE | Admit: 2016-08-13 | Discharge: 2016-08-13 | Disposition: A | Discharge status: Home / Self Care | Payer: No Typology Code available for payment source | Source: Ambulatory Visit | Attending: Urology | Admitting: Urology

## 2016-08-13 DIAGNOSIS — C3491 Malignant neoplasm of unspecified part of right bronchus or lung: Secondary | ICD-10-CM

## 2016-08-13 MED ORDER — GADOBENATE DIMEGLUMINE 529 MG/ML IV SOLN
14.0000 mL | Freq: Once | INTRAVENOUS | Status: AC | PRN
  Administered 2016-08-13: 14 mL via INTRAVENOUS

## 2016-08-16 ENCOUNTER — Other Ambulatory Visit: Payer: Self-pay | Admitting: Internal Medicine

## 2016-08-16 ENCOUNTER — Telehealth: Payer: Self-pay | Admitting: Medical Oncology

## 2016-08-16 ENCOUNTER — Other Ambulatory Visit: Payer: Self-pay | Admitting: Medical Oncology

## 2016-08-16 DIAGNOSIS — Z5111 Encounter for antineoplastic chemotherapy: Secondary | ICD-10-CM

## 2016-08-16 DIAGNOSIS — C3491 Malignant neoplasm of unspecified part of right bronchus or lung: Secondary | ICD-10-CM

## 2016-08-16 NOTE — Telephone Encounter (Signed)
Faxed [employer] form re plan of care /prognosis.

## 2016-08-16 NOTE — Progress Notes (Signed)
Called in refill for restoril

## 2016-08-17 ENCOUNTER — Telehealth: Payer: Self-pay | Admitting: *Deleted

## 2016-08-17 NOTE — Telephone Encounter (Signed)
CALLED PATIENT'S SON [name] TO REMIND OF SIM ON 08-20-16 AND DISCUSSION OF HIS MOM'S MRI, SPOKE WITH SON AND HE IS AWARE OF THIS

## 2016-08-18 ENCOUNTER — Encounter: Payer: Self-pay | Admitting: Radiation Oncology

## 2016-08-18 NOTE — Progress Notes (Signed)
Paperwork ([employer]) received 08/18/16, given to nurse 08/19/16

## 2016-08-20 ENCOUNTER — Telehealth: Payer: Self-pay | Admitting: Radiation Oncology

## 2016-08-20 ENCOUNTER — Ambulatory Visit
Admission: RE | Admit: 2016-08-20 | Discharge: 2016-08-20 | Disposition: A | Discharge status: Home / Self Care | Payer: No Typology Code available for payment source | Source: Ambulatory Visit | Attending: Radiation Oncology | Admitting: Radiation Oncology

## 2016-08-20 ENCOUNTER — Encounter: Payer: Self-pay | Admitting: Radiation Oncology

## 2016-08-20 DIAGNOSIS — C7931 Secondary malignant neoplasm of brain: Secondary | ICD-10-CM

## 2016-08-20 DIAGNOSIS — C3491 Malignant neoplasm of unspecified part of right bronchus or lung: Secondary | ICD-10-CM

## 2016-08-20 DIAGNOSIS — Z510 Encounter for antineoplastic radiation therapy: Secondary | ICD-10-CM | POA: Diagnosis not present

## 2016-08-20 MED ORDER — DEXAMETHASONE 4 MG PO TABS: 4.0000 mg | ORAL_TABLET | Freq: Two times a day (BID) | ORAL | 0 refills | Status: DC

## 2016-08-20 NOTE — Progress Notes (Signed)
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  Narrative:  The patient returns today for her scheduled CT simulation in anticipation of beginning PCI. Restaging MRI brain was obtained prior to this visit and reviewed with the patient and family today in the office. Unfortunately, there were numerous new enhancing supratentorial and infratentorial brain lesions, all subcentimeter in size. The largest lesion on the right measures 6 mm in the temporal lobe and the largest lesion left measures 4 mm in the posterior temporal lobe. There is no significant edema associated with any of these lesions.                    At her last visit, the patient reported intermittent episodes of nausea that have increased in frequency but resolve with Compazine. Today she reports persistent nausea as well as intermittent headaches and blurry vision. She denies diplopia. She reports her gait is remains unsteady at times and her legs feel weak but she has not fallen as a result. She denies pain or difficulty swallowing, but does constantly feel like she has something in her throat that she needs to clear. She denies shortness of breath or chest pain. She is accompanied today by her son and an interpretor.   PRIOR RADIATION THERAPY: Yes 03/18/16-03/31/16: Chest was treated to 30 Gy in 10 fractions of 3 Gy  Physical Exam Findings: In general this is a well appearing female in no acute distress. She's alert and oriented x4 and appropriate throughout the examination. Cardiopulmonary assessment is negative for acute distress and she exhibits normal effort.   Radiographic Findings: MRI Brain 08/13/16: IMPRESSION: 1. Numerous (approximately 36) new supratentorial and infratentorial brain metastases. No edema. 2. 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  Impression/Plan:  1. Extensive stage, T1b, N3, M1b small cell  carcinoma of the right upper lobe of the lung with new finding of brain metastases.  Today Dr. Tammi Klippel reviewed the results of her recent restaging MRI with the patient and family. Unfortunately, there are numerous new brain metastases. We will start her on 4 mg of dexamethasone twice a day for 3 days and then decrease to 1 twice a day thereafter. We anticipate a rapid taper off the steroids over the next 2 weeks. She will proceed with scheduled CT simulation for WBRT.   Nicholos Johns, PA-C

## 2016-08-20 NOTE — Progress Notes (Signed)
Paperwork ([employer]) received, faxed to [phone], attn: Kandra Nicolas, confirmation received, copy mailed to patient 08/20/16

## 2016-08-20 NOTE — Telephone Encounter (Signed)
Completed [employer] paperwork. Tammi Klippel signed the paperwork.[place] folder with completed paperwork given to [employer].

## 2016-08-23 ENCOUNTER — Ambulatory Visit
Admission: RE | Admit: 2016-08-23 | Discharge: 2016-08-23 | Disposition: A | Discharge status: Home / Self Care | Payer: No Typology Code available for payment source | Source: Ambulatory Visit | Attending: Radiation Oncology | Admitting: Radiation Oncology

## 2016-08-23 DIAGNOSIS — Z510 Encounter for antineoplastic radiation therapy: Secondary | ICD-10-CM | POA: Diagnosis not present

## 2016-08-24 ENCOUNTER — Ambulatory Visit
Admission: RE | Admit: 2016-08-24 | Discharge: 2016-08-24 | Disposition: A | Discharge status: Home / Self Care | Payer: No Typology Code available for payment source | Source: Ambulatory Visit | Attending: Radiation Oncology | Admitting: Radiation Oncology

## 2016-08-24 DIAGNOSIS — Z510 Encounter for antineoplastic radiation therapy: Secondary | ICD-10-CM | POA: Diagnosis not present

## 2016-08-25 ENCOUNTER — Ambulatory Visit
Admission: RE | Admit: 2016-08-25 | Discharge: 2016-08-25 | Disposition: A | Discharge status: Home / Self Care | Payer: No Typology Code available for payment source | Source: Ambulatory Visit | Attending: Radiation Oncology | Admitting: Radiation Oncology

## 2016-08-25 ENCOUNTER — Ambulatory Visit: Payer: No Typology Code available for payment source | Admitting: Radiation Oncology

## 2016-08-25 VITALS — BP 131/84 | HR 100 | Temp 97.8°F | Resp 18 | Wt 154.0 lb

## 2016-08-25 DIAGNOSIS — M549 Dorsalgia, unspecified: Secondary | ICD-10-CM

## 2016-08-25 DIAGNOSIS — C3491 Malignant neoplasm of unspecified part of right bronchus or lung: Secondary | ICD-10-CM

## 2016-08-25 DIAGNOSIS — Z510 Encounter for antineoplastic radiation therapy: Secondary | ICD-10-CM | POA: Diagnosis not present

## 2016-08-25 DIAGNOSIS — F411 Generalized anxiety disorder: Secondary | ICD-10-CM

## 2016-08-25 MED ORDER — LORAZEPAM 1 MG PO TABS: 1.0000 mg | ORAL_TABLET | Freq: Three times a day (TID) | ORAL | 0 refills | Status: DC

## 2016-08-25 NOTE — Progress Notes (Signed)
Patient presented to the clinic with her daughters and interpretor requesting to see Dr. Tammi Klippel. Patient reports increasing abdominal pain that radiates from her belly button around her left side up to her spine. Reports this pain is increasingly getting worse. Reports this pain is less in the morning but, reaches 9-10 later in the day. Reports she is constantly in pain. Reports minimal relief with repositioning. Reports Percocet prescribed by Dr. Julien Nordmann for pain relief is ineffective. Patient's last CT scan of the abdomen was on 07/26/16. Denies any bowel complaints. Reports nausea last week following chemotherapy but, denies any since. Reports her sleep aid (restoril) is ineffective. Reports she wakes up several times per night sometimes due to abdominal pain. Reports headaches and occasional dizziness. Denies visual changes but, reports eye pressure worse with reading or while watching TV. Denies tinnitus. Reports she began taking Decadron 4 mg bid on Monday. No evidence of thrush noted.   BP 131/84 (BP Location: Left Arm, Patient Position: Sitting, Cuff Size: Normal)   Pulse 100   Temp 97.8 F (36.6 C) (Oral)   Resp 18   Wt 154 lb (69.9 kg)   SpO2 98%   BMI 25.63 kg/m  Wt Readings from Last 3 Encounters:  08/25/16 154 lb (69.9 kg)  08/05/16 156 lb 1.6 oz (70.8 kg)  07/27/16 156 lb 9.6 oz (71 kg)

## 2016-08-25 NOTE — Progress Notes (Signed)
  Radiation Oncology         [phone]   Name: [name] MRN: [id number]   Date: 08/25/2016  DOB: [birth year]   Weekly Radiation Therapy Management    ICD-9-CM ICD-10-CM   1. Small cell carcinoma of lung, right (HCC) 162.9 C34.91   2. Anxiety state 300.00 F41.1 LORazepam (ATIVAN) 1 MG tablet  3. Primary malignant neoplasm of right lung metastatic to other site [facility]) 162.9 C34.91 MR THORACIC SPINE W WO CONTRAST     MR Lumbar Spine W Wo Contrast  4. Acute left-sided back pain, unspecified back location 724.5 M54.9 MR THORACIC SPINE W WO CONTRAST     MR Lumbar Spine W Wo Contrast    Current Dose: 9 Gy  Planned Dose:  30 Gy   Narrative The patient presents for routine under treatment assessment.  Patient presented to the clinic with her daughters and interpretor requesting to see Dr. Tammi Klippel. Pt endorsing pain that radiates from umbilicus to back on left. She states quality of pain dull rather than burning. She denies constipation but endorses excessive gas - has not tried Gas-x. She states most pain when sitting, when pressure on area. When she gets up, she has difficulty breathing until pain dissipates. She does endorse sleeplessness. Pt lays on right side to avoid pain. Pertinent areas are not sensitive to touch. Pt states pain has been present for a month. She also reports that oxycodone is not helping, pt has d/c'd. Of note, pt has anxiety w MRI and requests medication for this.    The patient is without complaint. Set-up films were reviewed. The chart was checked.  Physical Findings  weight is 154 lb (69.9 kg). Her oral temperature is 97.8 F (36.6 C). Her blood pressure is 131/84 and her pulse is 100. Her respiration is 18 and oxygen saturation is 98%.  Weight essentially stable.  No significant changes.  Impression The patient is tolerating radiation. Constipation or distension in bowels could be causing discomfort.  Plan Continue treatment as planned. I will order an MRI of T, L  spine. I advised the patient to use Gas-X as needed to discomfort. I also recommended 2 oxycodone tablets prn to better treat pain.         Sheral Apley Tammi Klippel, M.D.  This document serves as a record of services personally performed by Tyler Pita, MD and Freeman Caldron, PA-C. It was created on their behalf by Maryla Morrow, a trained medical scribe. The creation of this record is based on the scribe's personal observations and the provider's statements to them. This document has been checked and approved by the attending provider.

## 2016-08-26 ENCOUNTER — Ambulatory Visit
Admission: RE | Admit: 2016-08-26 | Discharge: 2016-08-26 | Disposition: A | Discharge status: Home / Self Care | Payer: No Typology Code available for payment source | Source: Ambulatory Visit | Attending: Radiation Oncology | Admitting: Radiation Oncology

## 2016-08-26 ENCOUNTER — Telehealth: Payer: Self-pay | Admitting: *Deleted

## 2016-08-26 DIAGNOSIS — Z510 Encounter for antineoplastic radiation therapy: Secondary | ICD-10-CM | POA: Diagnosis not present

## 2016-08-26 NOTE — Telephone Encounter (Signed)
CALLED PATIENT'S SON TO INFORM OF STAT LABS ON 08-27-16 @ 2 PM @ [facility] AND HER MRI ON 08-28-16 - ARRIVAL TIME - 8:45 AM @ [facility], SPOKE WITH PATIENT'S SON AND HE IS AWARE OF THESE APPTS

## 2016-08-27 ENCOUNTER — Ambulatory Visit: Payer: No Typology Code available for payment source

## 2016-08-27 ENCOUNTER — Ambulatory Visit
Admission: RE | Admit: 2016-08-27 | Discharge: 2016-08-27 | Disposition: A | Discharge status: Home / Self Care | Payer: No Typology Code available for payment source | Source: Ambulatory Visit | Attending: Radiation Oncology | Admitting: Radiation Oncology

## 2016-08-27 ENCOUNTER — Other Ambulatory Visit: Payer: Self-pay

## 2016-08-27 ENCOUNTER — Other Ambulatory Visit: Payer: Self-pay | Admitting: Radiation Oncology

## 2016-08-27 DIAGNOSIS — C3491 Malignant neoplasm of unspecified part of right bronchus or lung: Secondary | ICD-10-CM

## 2016-08-27 DIAGNOSIS — C7931 Secondary malignant neoplasm of brain: Secondary | ICD-10-CM

## 2016-08-27 DIAGNOSIS — C7949 Secondary malignant neoplasm of other parts of nervous system: Principal | ICD-10-CM

## 2016-08-27 DIAGNOSIS — Z510 Encounter for antineoplastic radiation therapy: Secondary | ICD-10-CM | POA: Diagnosis not present

## 2016-08-27 LAB — BUN AND CREATININE (CC13)
BUN: 14 mg/dL (ref 7.0–26.0)
CREATININE: 0.8 mg/dL (ref 0.6–1.1)
EGFR: 80 mL/min/{1.73_m2} — ABNORMAL LOW (ref >90)

## 2016-08-28 ENCOUNTER — Ambulatory Visit (HOSPITAL_COMMUNITY)
Admission: RE | Admit: 2016-08-28 | Discharge: 2016-08-28 | Disposition: A | Discharge status: Home / Self Care | Payer: No Typology Code available for payment source | Source: Ambulatory Visit | Attending: Urology | Admitting: Urology

## 2016-08-28 DIAGNOSIS — M5134 Other intervertebral disc degeneration, thoracic region: Secondary | ICD-10-CM | POA: Insufficient documentation

## 2016-08-28 DIAGNOSIS — C7951 Secondary malignant neoplasm of bone: Secondary | ICD-10-CM | POA: Diagnosis not present

## 2016-08-28 DIAGNOSIS — M549 Dorsalgia, unspecified: Secondary | ICD-10-CM

## 2016-08-28 DIAGNOSIS — Z9889 Other specified postprocedural states: Secondary | ICD-10-CM | POA: Insufficient documentation

## 2016-08-28 DIAGNOSIS — M5136 Other intervertebral disc degeneration, lumbar region: Secondary | ICD-10-CM | POA: Insufficient documentation

## 2016-08-28 DIAGNOSIS — C3491 Malignant neoplasm of unspecified part of right bronchus or lung: Secondary | ICD-10-CM | POA: Insufficient documentation

## 2016-08-28 DIAGNOSIS — M48061 Spinal stenosis, lumbar region without neurogenic claudication: Secondary | ICD-10-CM | POA: Insufficient documentation

## 2016-08-28 MED ORDER — GADOBENATE DIMEGLUMINE 529 MG/ML IV SOLN
14.0000 mL | Freq: Once | INTRAVENOUS | Status: AC | PRN
  Administered 2016-08-28: 14 mL via INTRAVENOUS

## 2016-08-30 ENCOUNTER — Ambulatory Visit
Admission: RE | Admit: 2016-08-30 | Discharge: 2016-08-30 | Disposition: A | Discharge status: Home / Self Care | Payer: No Typology Code available for payment source | Source: Ambulatory Visit | Attending: Radiation Oncology | Admitting: Radiation Oncology

## 2016-08-30 DIAGNOSIS — Z510 Encounter for antineoplastic radiation therapy: Secondary | ICD-10-CM | POA: Diagnosis not present

## 2016-08-31 ENCOUNTER — Ambulatory Visit
Admission: RE | Admit: 2016-08-31 | Discharge: 2016-08-31 | Disposition: A | Discharge status: Home / Self Care | Payer: No Typology Code available for payment source | Source: Ambulatory Visit | Attending: Radiation Oncology | Admitting: Radiation Oncology

## 2016-08-31 DIAGNOSIS — Z510 Encounter for antineoplastic radiation therapy: Secondary | ICD-10-CM | POA: Diagnosis not present

## 2016-08-31 DIAGNOSIS — C3491 Malignant neoplasm of unspecified part of right bronchus or lung: Secondary | ICD-10-CM

## 2016-09-01 ENCOUNTER — Ambulatory Visit
Admission: RE | Admit: 2016-09-01 | Discharge: 2016-09-01 | Disposition: A | Discharge status: Home / Self Care | Payer: No Typology Code available for payment source | Source: Ambulatory Visit | Attending: Radiation Oncology | Admitting: Radiation Oncology

## 2016-09-01 DIAGNOSIS — Z510 Encounter for antineoplastic radiation therapy: Secondary | ICD-10-CM | POA: Diagnosis not present

## 2016-09-02 ENCOUNTER — Ambulatory Visit
Admission: RE | Admit: 2016-09-02 | Discharge: 2016-09-02 | Disposition: A | Discharge status: Home / Self Care | Payer: No Typology Code available for payment source | Source: Ambulatory Visit | Attending: Radiation Oncology | Admitting: Radiation Oncology

## 2016-09-02 DIAGNOSIS — Z510 Encounter for antineoplastic radiation therapy: Secondary | ICD-10-CM | POA: Diagnosis not present

## 2016-09-03 ENCOUNTER — Ambulatory Visit: Payer: No Typology Code available for payment source

## 2016-09-03 ENCOUNTER — Ambulatory Visit
Admission: RE | Admit: 2016-09-03 | Discharge: 2016-09-03 | Disposition: A | Discharge status: Home / Self Care | Payer: No Typology Code available for payment source | Source: Ambulatory Visit | Attending: Radiation Oncology | Admitting: Radiation Oncology

## 2016-09-03 ENCOUNTER — Encounter: Payer: Self-pay | Admitting: Radiation Oncology

## 2016-09-03 DIAGNOSIS — Z510 Encounter for antineoplastic radiation therapy: Secondary | ICD-10-CM | POA: Diagnosis not present

## 2016-09-03 NOTE — Progress Notes (Signed)
  Radiation Oncology         [phone] ________________________________  Name: [name] MRN: [id number]  Date: 08/31/2016  DOB: [birth year]  SIMULATION AND TREATMENT PLANNING NOTE    ICD-9-CM ICD-10-CM   1. Small cell carcinoma of lung, right (HCC) 162.9 C34.91     DIAGNOSIS:  Painful L2 spinal metastasis  NARRATIVE:  The patient was brought to the [facility].  Identity was confirmed.  All relevant records and images related to the planned course of therapy were reviewed.  The patient freely provided informed written consent to proceed with treatment after reviewing the details related to the planned course of therapy. The consent form was witnessed and verified by the simulation staff.  Then, the patient was set-up in a stable reproducible  supine position for radiation therapy.  CT images were obtained.  Surface markings were placed.  The CT images were loaded into the planning software.  Then the target and avoidance structures were contoured.  Treatment planning then occurred.  The radiation prescription was entered and confirmed.  Then, I designed and supervised the construction of a total of 1 medically necessary complex treatment device as bodyFix.  I have requested : 3D Simulation  I have requested a DVH of the following structures: Lt Kidney, Rt Kidney, and target.    PLAN:  The patient will receive 20 Gy in 5 fractions.  ________________________________  Sheral Apley Tammi Klippel, M.D.

## 2016-09-06 ENCOUNTER — Ambulatory Visit: Payer: No Typology Code available for payment source

## 2016-09-06 ENCOUNTER — Ambulatory Visit
Admission: RE | Admit: 2016-09-06 | Discharge: 2016-09-06 | Disposition: A | Discharge status: Home / Self Care | Payer: No Typology Code available for payment source | Source: Ambulatory Visit | Attending: Radiation Oncology | Admitting: Radiation Oncology

## 2016-09-06 DIAGNOSIS — Z510 Encounter for antineoplastic radiation therapy: Secondary | ICD-10-CM | POA: Diagnosis not present

## 2016-09-07 ENCOUNTER — Ambulatory Visit
Admission: RE | Admit: 2016-09-07 | Discharge: 2016-09-07 | Disposition: A | Discharge status: Home / Self Care | Payer: No Typology Code available for payment source | Source: Ambulatory Visit | Attending: Radiation Oncology | Admitting: Radiation Oncology

## 2016-09-07 ENCOUNTER — Ambulatory Visit: Payer: No Typology Code available for payment source

## 2016-09-07 DIAGNOSIS — Z510 Encounter for antineoplastic radiation therapy: Secondary | ICD-10-CM | POA: Diagnosis not present

## 2016-09-08 ENCOUNTER — Ambulatory Visit
Admission: RE | Admit: 2016-09-08 | Discharge: 2016-09-08 | Disposition: A | Discharge status: Home / Self Care | Payer: No Typology Code available for payment source | Source: Ambulatory Visit | Attending: Radiation Oncology | Admitting: Radiation Oncology

## 2016-09-08 DIAGNOSIS — Z510 Encounter for antineoplastic radiation therapy: Secondary | ICD-10-CM | POA: Diagnosis not present

## 2016-09-08 NOTE — Progress Notes (Signed)
Patient said that she noticed that the palms of her hands were red at about 2 pm today.  They are warm to the touch although patient said they are always warm.  She denies taking any new medications, eating any new foods or using any new soaps.  She is taking decadron.  Dr. Tammi Klippel notified and is aware.  Patient's vitals signs were stable except for elevated heart rate of 116.  She said her heart rate goes up with any activity.  Patient advised to call the doctor on call or go to the ER if she develops a fever or the rash worsens.

## 2016-09-09 ENCOUNTER — Ambulatory Visit
Admission: RE | Admit: 2016-09-09 | Discharge: 2016-09-09 | Disposition: A | Discharge status: Home / Self Care | Payer: No Typology Code available for payment source | Source: Ambulatory Visit | Attending: Radiation Oncology | Admitting: Radiation Oncology

## 2016-09-09 ENCOUNTER — Other Ambulatory Visit: Payer: Self-pay | Admitting: Radiation Oncology

## 2016-09-09 DIAGNOSIS — C7931 Secondary malignant neoplasm of brain: Secondary | ICD-10-CM

## 2016-09-09 DIAGNOSIS — C3491 Malignant neoplasm of unspecified part of right bronchus or lung: Secondary | ICD-10-CM

## 2016-09-09 DIAGNOSIS — Z510 Encounter for antineoplastic radiation therapy: Secondary | ICD-10-CM | POA: Diagnosis not present

## 2016-09-09 DIAGNOSIS — F411 Generalized anxiety disorder: Secondary | ICD-10-CM

## 2016-09-09 MED ORDER — DEXAMETHASONE 4 MG PO TABS: 4.0000 mg | ORAL_TABLET | Freq: Every day | ORAL | 0 refills | Status: DC

## 2016-09-09 MED ORDER — OXYCODONE HCL 10 MG PO TABS: ORAL_TABLET | ORAL | 0 refills | Status: DC

## 2016-09-09 MED ORDER — LORAZEPAM 1 MG PO TABS: 0.5000 mg | ORAL_TABLET | Freq: Three times a day (TID) | ORAL | 0 refills | Status: AC | PRN

## 2016-09-10 ENCOUNTER — Ambulatory Visit
Admission: RE | Admit: 2016-09-10 | Discharge: 2016-09-10 | Disposition: A | Discharge status: Home / Self Care | Payer: No Typology Code available for payment source | Source: Ambulatory Visit | Attending: Radiation Oncology | Admitting: Radiation Oncology

## 2016-09-10 ENCOUNTER — Telehealth: Payer: Self-pay | Admitting: Radiation Oncology

## 2016-09-10 ENCOUNTER — Encounter: Payer: Self-pay | Admitting: Radiation Oncology

## 2016-09-10 DIAGNOSIS — Z510 Encounter for antineoplastic radiation therapy: Secondary | ICD-10-CM | POA: Diagnosis not present

## 2016-09-10 NOTE — Telephone Encounter (Signed)
Per Shona Simpson, PA-C order called in Decadron 4 mg to the patient's [place]. Script found on printer but, intended to be escribed.

## 2016-09-13 ENCOUNTER — Ambulatory Visit: Admission: RE | Admit: 2016-09-13 | Payer: No Typology Code available for payment source | Source: Ambulatory Visit

## 2016-09-14 ENCOUNTER — Encounter: Payer: Self-pay | Admitting: Medical Oncology

## 2016-09-14 ENCOUNTER — Telehealth: Payer: Self-pay | Admitting: Medical Oncology

## 2016-09-14 ENCOUNTER — Ambulatory Visit: Payer: No Typology Code available for payment source

## 2016-09-14 NOTE — Telephone Encounter (Signed)
  Letter ready for son to pick up. Son notified.

## 2016-09-15 ENCOUNTER — Ambulatory Visit: Payer: No Typology Code available for payment source

## 2016-09-16 ENCOUNTER — Ambulatory Visit: Payer: No Typology Code available for payment source

## 2016-09-17 ENCOUNTER — Other Ambulatory Visit: Payer: Self-pay | Admitting: Internal Medicine

## 2016-09-17 ENCOUNTER — Ambulatory Visit: Payer: No Typology Code available for payment source

## 2016-09-17 DIAGNOSIS — C3491 Malignant neoplasm of unspecified part of right bronchus or lung: Secondary | ICD-10-CM

## 2016-09-17 DIAGNOSIS — Z5111 Encounter for antineoplastic chemotherapy: Secondary | ICD-10-CM

## 2016-09-21 ENCOUNTER — Other Ambulatory Visit: Payer: Self-pay | Admitting: Medical Oncology

## 2016-09-21 NOTE — Progress Notes (Signed)
Called in temazepam.

## 2016-09-22 NOTE — Progress Notes (Signed)
  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     Indication for treatment:  Palliation       Radiation treatment dates:   08/23/2016 to 09/10/2016 Brain radiation: 08/23/2016 to 09/03/2016 L-Spine radiation: 09/06/2016 to 09/10/2016  Site/dose:    1. The brain was treated to 30 Gy in 10 fractions at 3 Gy per fraction. 2. The L-Spine was treated to 20 Gy in 5 fractions at 4 Gy per fraction.   Beams/energy:    1. Isodose plan // 6X 2. 3D Plan // 15X  Narrative: The patient tolerated radiation treatment relatively well.  Patient states heaviness in head. She also reports pain 8/10 left side radiating to abdomen, noting there is a knot on left lower side.   Plan: The patient has completed radiation treatment. The patient will return to radiation oncology clinic for routine followup in one month. I advised her to call or return sooner if she has any questions or concerns related to her recovery or treatment. ________________________________  Sheral Apley. Tammi Klippel, M.D.   This document serves as a record of services personally performed by Tyler Pita, MD. It was created on his behalf by Arlyce Harman, a trained medical scribe. The creation of this record is based on the scribe's personal observations and the provider's statements to them. This document has been checked and approved by the attending provider.

## 2016-09-26 NOTE — Progress Notes (Signed)
  Radiation Oncology         [phone] ________________________________  Name: [name] MRN: [id number]  Date: 08/20/2016  DOB: May 11, 1951  SIMULATION AND TREATMENT PLANNING NOTE    ICD-9-CM ICD-10-CM   1. Metastasis to brain (HCC) 198.3 C79.31 DISCONTINUED: dexamethasone (DECADRON) 4 MG tablet  2. Small cell carcinoma of lung, right (HCC) 162.9 C34.91 DISCONTINUED: dexamethasone (DECADRON) 4 MG tablet    DIAGNOSIS:  66 yo woman with at least 36 brain met from small cell lung cancer  NARRATIVE:  The patient was brought to the [facility].  Identity was confirmed.  All relevant records and images related to the planned course of therapy were reviewed.  The patient freely provided informed written consent to proceed with treatment after reviewing the details related to the planned course of therapy. The consent form was witnessed and verified by the simulation staff.  Then, the patient was set-up in a stable reproducible  supine position for radiation therapy.  CT images were obtained.  Surface markings were placed.  The CT images were loaded into the planning software.  Then the target and avoidance structures were contoured.  Treatment planning then occurred.  The radiation prescription was entered and confirmed.  Then, I designed and supervised the construction of a total of 3 medically necessary complex treatment devices, including a custom made thermoplastic mask used for immobilization and two complex multileaf collimators to cover the entire intracranial contents, while shielding the eyes and face.  Each [facility] is independently created to account for beam divergence.  The right and left lateral fields will be treated with 6 MV X-rays.  I have requested : Isodose Plan.    PLAN:  The whole brain will be treated to 30 Gy in 10 fractions  ________________________________  Sheral Apley. Tammi Klippel, M.D.

## 2016-09-30 ENCOUNTER — Other Ambulatory Visit: Payer: Self-pay | Admitting: Family Medicine

## 2016-10-07 ENCOUNTER — Telehealth: Payer: Self-pay | Admitting: Radiation Oncology

## 2016-10-07 NOTE — Telephone Encounter (Signed)
As ordered by Freeman Caldron called in Ativan to [facility]. Then, phoned patient's son making him aware this was done.

## 2016-10-07 NOTE — Telephone Encounter (Signed)
-----   Message from Freeman Caldron, [place] sent at 10/07/2016  3:22 PM EDT ----- Regarding: RE: Medication refill request OK to call in refill of Ativan '1mg'$  po q8hrs prn. #30 with no refill. Thanks! -Ash ----- Message ----- From: Heywood Footman, RN Sent: 10/07/2016   1:23 PM To: Freeman Caldron, PA-C, # Subject: Medication refill request                      Patient's son called today requesting his mother's Ativan 1 mg be refilled.   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     Indication for treatment:  Palliation       Radiation treatment dates:   08/23/2016 to 09/10/2016 Brain radiation: 08/23/2016 to 09/03/2016 L-Spine radiation: 09/06/2016 to 09/10/2016  Site/dose:    1. The brain was treated to 30 Gy in 10 fractions at 3 Gy per fraction. 2. The L-Spine was treated to 20 Gy in 5 fractions at 4 Gy per fraction

## 2016-10-11 NOTE — Progress Notes (Signed)
[name] 66 y.o. 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  Marland Kitchen PAIN: She is currently having pain to left side 8/10 taking Oxycodone NEURO:Alert and oriented x 3 with fluent speech,able to complete sentences without difficulty with word finding or organization of sentences. Reports headache, blurred vision and balance, wears glasses.  Denies reports any nausea or vomiting,ataxia,ring of ears,dizziness. Fine motor movement-picking up objects with fingers,holding objects, writing, weakness of lower extremities: Has weakness of her legs at times. Aphasia/Slurred speech:None SKIN:Warm and dry Imaging:None WCB:JSEG Fatigue:Having fatigue at the end of the day. Appetite:Good Bowel/Bladder issues:Takes longer to void has a weaker stream, denies burning. Weight: Wt Readings from Last 3 Encounters:  10/12/16 148 lb (67.1 kg)  08/25/16 154 lb (69.9 kg)  08/28/16 150 lb (68 kg)   07-27-16 Saw Dr Julien Nordmann I recommended for the patient to continue on observation for now with repeat CT scan of the chest, abdomen and pelvis in 3 months for restaging of her disease. I also discussed with the patient referral to Dr. Tammi Klippel for consideration of prophylactic cranial irradiation: BP 118/81   Pulse (!) 119   Temp 97.7 F (36.5 C) (Oral)   Resp 16   Ht '5\' 5"'$  (1.651 m)   Wt 148 lb (67.1 kg)   SpO2 97%   BMI 24.63 kg/m

## 2016-10-12 ENCOUNTER — Ambulatory Visit
Admission: RE | Admit: 2016-10-12 | Discharge: 2016-10-12 | Disposition: A | Discharge status: Home / Self Care | Payer: No Typology Code available for payment source | Source: Ambulatory Visit | Attending: Radiation Oncology | Admitting: Radiation Oncology

## 2016-10-12 ENCOUNTER — Telehealth: Payer: Self-pay | Admitting: Radiation Oncology

## 2016-10-12 ENCOUNTER — Encounter: Payer: Self-pay | Admitting: Radiation Oncology

## 2016-10-12 DIAGNOSIS — C7931 Secondary malignant neoplasm of brain: Secondary | ICD-10-CM | POA: Insufficient documentation

## 2016-10-12 DIAGNOSIS — C3491 Malignant neoplasm of unspecified part of right bronchus or lung: Secondary | ICD-10-CM

## 2016-10-12 DIAGNOSIS — C3411 Malignant neoplasm of upper lobe, right bronchus or lung: Secondary | ICD-10-CM | POA: Insufficient documentation

## 2016-10-12 MED ORDER — OXYCODONE HCL 10 MG PO TABS: ORAL_TABLET | ORAL | 0 refills | Status: AC

## 2016-10-12 MED ORDER — GABAPENTIN 300 MG PO CAPS: 300.0000 mg | ORAL_CAPSULE | Freq: Three times a day (TID) | ORAL | 0 refills | Status: AC

## 2016-10-12 NOTE — Addendum Note (Signed)
Encounter addended by: Malena Edman, RN on: 10/12/2016 11:22 AM<BR>    Actions taken: Charge Capture section accepted

## 2016-10-12 NOTE — Telephone Encounter (Signed)
I spoke with the patient's son to let him know that Dr. Tammi Klippel has recommended that we follow up with her once she's had her CT scan and met with Dr. Julien Nordmann to discuss additional options to consider more radiotherapy.

## 2016-10-12 NOTE — Progress Notes (Addendum)
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   08/23/2016 to 09/03/2016 the whole brain was treated to 30 Gy in 10 fractions at 3 Gy per fraction  03/18/16-03/31/16: The chest was treated to 30 Gy in 10 fractions of 3 Gy  Narrative:  The patient returns today for routine follow-up. The patient tolerated radiotherapy well but did have trouble with nausea during the course of therapy as well as progressive discomfort along the left side of the chest wall and abdomen. She is planning on restaging imaging per Dr. Julien Nordmann in the next few weeks.                    On review of systems, she describes constant pain in the left mid back that radiates around to the middle of her abdomen and toward the umbilicus. She does not have any rash, or blistering of the skin. She reports the palpable lump in her left flank continues to enlarge as well. She denies any lower extremity weakness or burning sensation of there chest wall. She denies any shortness of breath with resting, but occasionally notes this with exertion. She denies any fevers, chills, current nausea, or difficulty with bowel function. She reports that it takes her longer in the last few weeks to empty her bladder.  No other complaints are noted.  ALLERGIES:  is allergic to penicillins.  Meds: Current Outpatient Prescriptions  Medication Sig Dispense Refill  . acetaminophen (TYLENOL) 325 MG tablet Take 650 mg by mouth every 6 (six) hours as needed.    Marland Kitchen dexamethasone (DECADRON) 4 MG tablet Take 1 tablet (4 mg total) by mouth daily.  60 tablet 0  . hydrochlorothiazide (HYDRODIURIL) 25 MG tablet TAKE 1 TABLET(25 MG) BY MOUTH DAILY 30 tablet 0  . LORazepam (ATIVAN) 1 MG tablet Take 0.5-1 tablets (0.5-1 mg total) by mouth every 8 (eight) hours as needed for anxiety or sleep. 30 tablet 0  . Oxycodone HCl 10 MG TABS Take 1-2 tablets po q 4-6 hours prn pain 60 tablet 0  . temazepam (RESTORIL) 30 MG capsule TAKE 1 CAPSULE BY MOUTH EVERY DAY AT BEDTIME AS NEEDED 30 capsule 0  . atorvastatin (LIPITOR) 40 MG tablet Take 40 mg by mouth at bedtime.   0  . benzonatate (TESSALON) 100 MG capsule Take 1-2 capsules (100-200 mg total) by mouth 3 (three) times daily as needed for cough. (Patient not taking: Reported on 10/12/2016) 40 capsule 0  . gabapentin (NEURONTIN) 300 MG capsule Take 1 capsule (300 mg total) by mouth 3 (three) times daily. 90 capsule 0  . prochlorperazine (COMPAZINE) 10 MG tablet Take 1 tablet (10 mg total) by mouth every 6 (six) hours as needed for nausea or vomiting. (Patient not taking: Reported on 08/25/2016) 30 tablet 0   No current facility-administered medications for this encounter.     Physical Findings:  height is '5\' 5"'$  (1.651 m) and weight is 148 lb (67.1 kg). Her oral temperature is 97.7 F (36.5 C). Her blood pressure is 118/81 and  her pulse is 119 (abnormal). Her respiration is 16 and oxygen saturation is 97%.   Pain Assessment Pain Score: 8  (Left side)/10  In general this is a well appearing [place] european female in no acute distress. She's alert and oriented x4 and appropriate throughout the examination. Cardiopulmonary assessment reveals a RRR, no C/R/M are noted, and chest is CTAB. There is reproducible pain with palpation over the lower aspect of the thoracic spine. She has about 4-5 cm of fullness along the lateral aspect of her left flank at the level of where she describes the pain originates from. No erosion of tumor is noted through the skin.  Lab Findings: Lab Results  Component Value Date    WBC 15.2 (H) 07/20/2016   HGB 11.9 07/20/2016   HCT 34.9 07/20/2016   MCV 96.0 07/20/2016   PLT 136 (L) 07/20/2016     Radiographic Findings: No results found.  Impression/Plan: 1. 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 The patient is due for repeat staging imaging with Dr. Julien Nordmann. We will continue to follow her in our brain program as well and will repeat her MRI in 3 months' time. She will taper her dexamethasone from '4mg'$  daily to 2 mg daily x 5 days starting today, then 2 mg QOD for 5 days, then stop. If she has trouble with symptoms, she will contact our office. She states agreement and understanding. 2. Mid-back pain with history of thoracic disease. The patient is very symptomatic. I will discuss her case with Dr. Tammi Klippel to see if he thinks there is a role for radiotherapy to the thoracic spine. I have given her a prescription for oxycodone as well as gabapentin for off label use of radiculopathy. She will keep [place] informed of her progress. 3. Shortness of breath with exertion. Although this could be multifactorial due to her disease and previous treatment, the patient is counseled on being seen in an urgent setting if her symptoms worsen. She states agreement and understanding of this plan.     Carola Rhine, PAC

## 2016-10-13 ENCOUNTER — Encounter: Payer: Self-pay | Admitting: Radiation Oncology

## 2016-10-13 NOTE — Progress Notes (Signed)
Paperwork ([employer]) received 5/2, given to nurse

## 2016-10-18 ENCOUNTER — Telehealth: Payer: Self-pay | Admitting: Radiation Oncology

## 2016-10-18 ENCOUNTER — Telehealth: Payer: Self-pay | Admitting: Medical Oncology

## 2016-10-18 NOTE — Telephone Encounter (Signed)
Ct approved and CS calling son to schedule.

## 2016-10-18 NOTE — Telephone Encounter (Signed)
Complete and signed [employer] paperwork given to Javata Epps to finish processing.

## 2016-10-19 ENCOUNTER — Encounter: Payer: Self-pay | Admitting: Radiation Oncology

## 2016-10-19 ENCOUNTER — Telehealth: Payer: Self-pay | Admitting: Internal Medicine

## 2016-10-19 NOTE — Telephone Encounter (Signed)
Per 5/8 schedule message moved 5/11 lab closer to 3 pm ct.  Spoke with son re new lab time 5/11 @ 2 pm.

## 2016-10-19 NOTE — Progress Notes (Signed)
PAPERWORK ([employer]) FAXED TO [phone], CONF RECEIVED, COPY MAILED TO PATIENT. 10/19/16

## 2016-10-22 ENCOUNTER — Telehealth: Payer: Self-pay

## 2016-10-22 ENCOUNTER — Other Ambulatory Visit (HOSPITAL_BASED_OUTPATIENT_CLINIC_OR_DEPARTMENT_OTHER): Payer: No Typology Code available for payment source

## 2016-10-22 ENCOUNTER — Encounter (HOSPITAL_COMMUNITY): Payer: Self-pay | Admitting: Emergency Medicine

## 2016-10-22 ENCOUNTER — Encounter (HOSPITAL_COMMUNITY): Payer: Self-pay

## 2016-10-22 ENCOUNTER — Inpatient Hospital Stay (HOSPITAL_COMMUNITY)
Admission: EM | Admit: 2016-10-22 | Discharge: 2016-11-12 | DRG: 175 | Disposition: E | Payer: No Typology Code available for payment source | Attending: Internal Medicine | Admitting: Internal Medicine

## 2016-10-22 ENCOUNTER — Other Ambulatory Visit: Payer: Self-pay

## 2016-10-22 ENCOUNTER — Ambulatory Visit (HOSPITAL_COMMUNITY)
Admission: RE | Admit: 2016-10-22 | Discharge: 2016-10-22 | Disposition: A | Discharge status: Home / Self Care | Payer: No Typology Code available for payment source | Source: Ambulatory Visit | Attending: Internal Medicine | Admitting: Internal Medicine

## 2016-10-22 DIAGNOSIS — C7951 Secondary malignant neoplasm of bone: Secondary | ICD-10-CM | POA: Diagnosis present

## 2016-10-22 DIAGNOSIS — Z5111 Encounter for antineoplastic chemotherapy: Secondary | ICD-10-CM

## 2016-10-22 DIAGNOSIS — K922 Gastrointestinal hemorrhage, unspecified: Secondary | ICD-10-CM

## 2016-10-22 DIAGNOSIS — C7989 Secondary malignant neoplasm of other specified sites: Secondary | ICD-10-CM | POA: Diagnosis present

## 2016-10-22 DIAGNOSIS — K7689 Other specified diseases of liver: Secondary | ICD-10-CM | POA: Diagnosis present

## 2016-10-22 DIAGNOSIS — C3491 Malignant neoplasm of unspecified part of right bronchus or lung: Secondary | ICD-10-CM | POA: Diagnosis present

## 2016-10-22 DIAGNOSIS — D649 Anemia, unspecified: Secondary | ICD-10-CM | POA: Diagnosis not present

## 2016-10-22 DIAGNOSIS — D696 Thrombocytopenia, unspecified: Secondary | ICD-10-CM | POA: Diagnosis present

## 2016-10-22 DIAGNOSIS — R740 Nonspecific elevation of levels of transaminase and lactic acid dehydrogenase [LDH]: Secondary | ICD-10-CM | POA: Diagnosis not present

## 2016-10-22 DIAGNOSIS — E876 Hypokalemia: Secondary | ICD-10-CM | POA: Diagnosis not present

## 2016-10-22 DIAGNOSIS — Y92230 Patient room in hospital as the place of occurrence of the external cause: Secondary | ICD-10-CM | POA: Diagnosis not present

## 2016-10-22 DIAGNOSIS — K921 Melena: Secondary | ICD-10-CM | POA: Diagnosis not present

## 2016-10-22 DIAGNOSIS — C3411 Malignant neoplasm of upper lobe, right bronchus or lung: Secondary | ICD-10-CM | POA: Diagnosis present

## 2016-10-22 DIAGNOSIS — Z923 Personal history of irradiation: Secondary | ICD-10-CM

## 2016-10-22 DIAGNOSIS — Z87891 Personal history of nicotine dependence: Secondary | ICD-10-CM | POA: Diagnosis not present

## 2016-10-22 DIAGNOSIS — I82432 Acute embolism and thrombosis of left popliteal vein: Secondary | ICD-10-CM | POA: Diagnosis present

## 2016-10-22 DIAGNOSIS — T45515A Adverse effect of anticoagulants, initial encounter: Secondary | ICD-10-CM | POA: Diagnosis not present

## 2016-10-22 DIAGNOSIS — D62 Acute posthemorrhagic anemia: Secondary | ICD-10-CM | POA: Diagnosis not present

## 2016-10-22 DIAGNOSIS — I82492 Acute embolism and thrombosis of other specified deep vein of left lower extremity: Secondary | ICD-10-CM | POA: Diagnosis present

## 2016-10-22 DIAGNOSIS — R7401 Elevation of levels of liver transaminase levels: Secondary | ICD-10-CM | POA: Diagnosis present

## 2016-10-22 DIAGNOSIS — I82442 Acute embolism and thrombosis of left tibial vein: Secondary | ICD-10-CM | POA: Diagnosis present

## 2016-10-22 DIAGNOSIS — C771 Secondary and unspecified malignant neoplasm of intrathoracic lymph nodes: Secondary | ICD-10-CM | POA: Diagnosis present

## 2016-10-22 DIAGNOSIS — Z515 Encounter for palliative care: Secondary | ICD-10-CM | POA: Diagnosis not present

## 2016-10-22 DIAGNOSIS — I10 Essential (primary) hypertension: Secondary | ICD-10-CM | POA: Diagnosis not present

## 2016-10-22 DIAGNOSIS — E871 Hypo-osmolality and hyponatremia: Secondary | ICD-10-CM | POA: Diagnosis present

## 2016-10-22 DIAGNOSIS — Z66 Do not resuscitate: Secondary | ICD-10-CM | POA: Diagnosis not present

## 2016-10-22 DIAGNOSIS — C7931 Secondary malignant neoplasm of brain: Secondary | ICD-10-CM | POA: Diagnosis present

## 2016-10-22 DIAGNOSIS — Z90710 Acquired absence of both cervix and uterus: Secondary | ICD-10-CM

## 2016-10-22 DIAGNOSIS — R945 Abnormal results of liver function studies: Secondary | ICD-10-CM

## 2016-10-22 DIAGNOSIS — G893 Neoplasm related pain (acute) (chronic): Secondary | ICD-10-CM | POA: Diagnosis present

## 2016-10-22 DIAGNOSIS — J9601 Acute respiratory failure with hypoxia: Secondary | ICD-10-CM | POA: Diagnosis present

## 2016-10-22 DIAGNOSIS — Z880 Allergy status to penicillin: Secondary | ICD-10-CM

## 2016-10-22 DIAGNOSIS — I82412 Acute embolism and thrombosis of left femoral vein: Secondary | ICD-10-CM | POA: Diagnosis present

## 2016-10-22 DIAGNOSIS — Z9221 Personal history of antineoplastic chemotherapy: Secondary | ICD-10-CM

## 2016-10-22 DIAGNOSIS — R0602 Shortness of breath: Secondary | ICD-10-CM | POA: Diagnosis not present

## 2016-10-22 DIAGNOSIS — I2692 Saddle embolus of pulmonary artery without acute cor pulmonale: Principal | ICD-10-CM

## 2016-10-22 DIAGNOSIS — D6959 Other secondary thrombocytopenia: Secondary | ICD-10-CM | POA: Diagnosis present

## 2016-10-22 DIAGNOSIS — Z808 Family history of malignant neoplasm of other organs or systems: Secondary | ICD-10-CM

## 2016-10-22 DIAGNOSIS — Z7189 Other specified counseling: Secondary | ICD-10-CM | POA: Diagnosis not present

## 2016-10-22 DIAGNOSIS — D630 Anemia in neoplastic disease: Secondary | ICD-10-CM | POA: Diagnosis present

## 2016-10-22 DIAGNOSIS — R7989 Other specified abnormal findings of blood chemistry: Secondary | ICD-10-CM

## 2016-10-22 LAB — COMPREHENSIVE METABOLIC PANEL
ALBUMIN: 2.6 g/dL — AB (ref 3.5–5.0)
ALK PHOS: 359 U/L — AB (ref 38–126)
ALT: 190 U/L — AB (ref 0–55)
ALT: 158 U/L — ABNORMAL HIGH (ref 14–54)
ANION GAP: 15 meq/L — AB (ref 3–11)
ANION GAP: 11 (ref 5–15)
AST: 192 U/L — AB (ref 5–34)
AST: 144 U/L — ABNORMAL HIGH (ref 15–41)
Albumin: 2.5 g/dL — ABNORMAL LOW (ref 3.5–5.0)
Alkaline Phosphatase: 436 U/L — ABNORMAL HIGH (ref 40–150)
BILIRUBIN TOTAL: 2.18 mg/dL — AB (ref 0.20–1.20)
BUN: 15.7 mg/dL (ref 7.0–26.0)
BUN: 14 mg/dL (ref 6–20)
CALCIUM: 8.8 mg/dL — AB (ref 8.9–10.3)
CHLORIDE: 90 meq/L — AB (ref 98–109)
CO2: 28 meq/L (ref 22–29)
CO2: 29 mmol/L (ref 22–32)
CREATININE: 0.8 mg/dL (ref 0.6–1.1)
Calcium: 9.9 mg/dL (ref 8.4–10.4)
Chloride: 89 mmol/L — ABNORMAL LOW (ref 101–111)
Creatinine, Ser: 0.58 mg/dL (ref 0.44–1.00)
EGFR: 83 mL/min/{1.73_m2} — ABNORMAL LOW (ref >90)
GFR calc Af Amer: >60 mL/min (ref >60)
GLUCOSE: 153 mg/dL — AB (ref 65–99)
Glucose: 179 mg/dl — ABNORMAL HIGH (ref 70–140)
POTASSIUM: 2.5 mmol/L — AB (ref 3.5–5.1)
Potassium: 3.3 mEq/L — ABNORMAL LOW (ref 3.5–5.1)
Sodium: 133 mEq/L — ABNORMAL LOW (ref 136–145)
Sodium: 129 mmol/L — ABNORMAL LOW (ref 135–145)
TOTAL PROTEIN: 6.7 g/dL (ref 6.4–8.3)
TOTAL PROTEIN: 5.9 g/dL — AB (ref 6.5–8.1)
Total Bilirubin: 1.4 mg/dL — ABNORMAL HIGH (ref 0.3–1.2)

## 2016-10-22 LAB — CBC WITH DIFFERENTIAL/PLATELET
BASO%: 0.2 % (ref 0.0–2.0)
BASOS ABS: 0 10*3/uL (ref 0.0–0.1)
BASOS ABS: 0 10*3/uL (ref 0.0–0.1)
BASOS PCT: 0 %
EOS ABS: 0 10*3/uL (ref 0.0–0.5)
EOS PCT: 0 %
EOS%: 0.1 % (ref 0.0–7.0)
Eosinophils Absolute: 0 10*3/uL (ref 0.0–0.7)
HEMATOCRIT: 31.7 % — AB (ref 34.8–46.6)
HEMATOCRIT: 28 % — AB (ref 36.0–46.0)
HGB: 10.3 g/dL — ABNORMAL LOW (ref 11.6–15.9)
Hemoglobin: 9.2 g/dL — ABNORMAL LOW (ref 12.0–15.0)
LYMPH#: 1 10*3/uL (ref 0.9–3.3)
LYMPH%: 8.9 % — ABNORMAL LOW (ref 14.0–49.7)
LYMPHS PCT: 9 %
Lymphs Abs: 0.9 10*3/uL (ref 0.7–4.0)
MCH: 30.3 pg (ref 25.1–34.0)
MCH: 30.2 pg (ref 26.0–34.0)
MCHC: 32.5 g/dL (ref 31.5–36.0)
MCHC: 32.9 g/dL (ref 30.0–36.0)
MCV: 93.2 fL (ref 79.5–101.0)
MCV: 91.8 fL (ref 78.0–100.0)
MONO#: 0.7 10*3/uL (ref 0.1–0.9)
MONO%: 6.1 % (ref 0.0–14.0)
MONOS PCT: 7 %
Monocytes Absolute: 0.7 10*3/uL (ref 0.1–1.0)
NEUT%: 84.7 % — AB (ref 38.4–76.8)
NEUTROS ABS: 9.8 10*3/uL — AB (ref 1.5–6.5)
NEUTROS ABS: 8.5 10*3/uL — AB (ref 1.7–7.7)
NRBC: 1 % — AB (ref 0–0)
Neutrophils Relative %: 84 %
Platelets: 86 10*3/uL — ABNORMAL LOW (ref 145–400)
Platelets: 79 10*3/uL — ABNORMAL LOW (ref 150–400)
RBC: 3.4 10*6/uL — AB (ref 3.70–5.45)
RBC: 3.05 MIL/uL — ABNORMAL LOW (ref 3.87–5.11)
RDW: 18.8 % — ABNORMAL HIGH (ref 11.2–14.5)
RDW: 18.5 % — ABNORMAL HIGH (ref 11.5–15.5)
WBC: 11.5 10*3/uL — ABNORMAL HIGH (ref 3.9–10.3)
WBC: 10.1 10*3/uL (ref 4.0–10.5)

## 2016-10-22 LAB — PROTIME-INR
INR: 1.05
Prothrombin Time: 13.8 seconds (ref 11.4–15.2)

## 2016-10-22 LAB — MAGNESIUM: Magnesium: 1.8 mg/dL (ref 1.7–2.4)

## 2016-10-22 LAB — I-STAT TROPONIN, ED: Troponin i, poc: 0.01 ng/mL (ref 0.00–0.08)

## 2016-10-22 MED ORDER — ONDANSETRON HCL 4 MG/2ML IJ SOLN: 4.0000 mg | Freq: Four times a day (QID) | INTRAMUSCULAR | Status: DC | PRN

## 2016-10-22 MED ORDER — LORAZEPAM 0.5 MG PO TABS
0.5000 mg | ORAL_TABLET | Freq: Three times a day (TID) | ORAL | Status: DC | PRN
  Administered 2016-10-24: 1 mg via ORAL
  Filled 2016-10-22: qty 2

## 2016-10-22 MED ORDER — IOPAMIDOL (ISOVUE-300) INJECTION 61%
INTRAVENOUS | Status: AC
  Administered 2016-10-22: 100 mL via INTRAVENOUS
  Filled 2016-10-22: qty 100

## 2016-10-22 MED ORDER — POTASSIUM CHLORIDE CRYS ER 20 MEQ PO TBCR
40.0000 meq | EXTENDED_RELEASE_TABLET | Freq: Every day | ORAL | Status: DC
  Administered 2016-10-22: 40 meq via ORAL
  Filled 2016-10-22: qty 2

## 2016-10-22 MED ORDER — SODIUM CHLORIDE 0.9% FLUSH
3.0000 mL | Freq: Two times a day (BID) | INTRAVENOUS | Status: DC
  Administered 2016-10-22 – 2016-10-25 (×4): 3 mL via INTRAVENOUS

## 2016-10-22 MED ORDER — TEMAZEPAM 15 MG PO CAPS
30.0000 mg | ORAL_CAPSULE | Freq: Every day | ORAL | Status: DC
  Administered 2016-10-22 – 2016-10-24 (×3): 30 mg via ORAL
  Filled 2016-10-22 (×3): qty 2

## 2016-10-22 MED ORDER — ONDANSETRON HCL 4 MG PO TABS: 4.0000 mg | ORAL_TABLET | Freq: Four times a day (QID) | ORAL | Status: DC | PRN

## 2016-10-22 MED ORDER — MORPHINE SULFATE (PF) 4 MG/ML IV SOLN
2.0000 mg | INTRAVENOUS | Status: DC | PRN
  Administered 2016-10-22: 4 mg via INTRAVENOUS
  Administered 2016-10-23 – 2016-10-25 (×5): 2 mg via INTRAVENOUS
  Filled 2016-10-22 (×6): qty 1

## 2016-10-22 MED ORDER — HYDROCHLOROTHIAZIDE 25 MG PO TABS
25.0000 mg | ORAL_TABLET | Freq: Every day | ORAL | Status: DC
Start: 2016-10-22 — End: 2016-10-22

## 2016-10-22 MED ORDER — GABAPENTIN 300 MG PO CAPS
300.0000 mg | ORAL_CAPSULE | Freq: Three times a day (TID) | ORAL | Status: DC
  Administered 2016-10-22 – 2016-10-25 (×9): 300 mg via ORAL
  Filled 2016-10-22 (×9): qty 1

## 2016-10-22 MED ORDER — ORAL CARE MOUTH RINSE
15.0000 mL | Freq: Two times a day (BID) | OROMUCOSAL | Status: DC
  Administered 2016-10-22 – 2016-10-25 (×7): 15 mL via OROMUCOSAL

## 2016-10-22 MED ORDER — ENOXAPARIN SODIUM 80 MG/0.8ML ~~LOC~~ SOLN
1.0000 mg/kg | Freq: Two times a day (BID) | SUBCUTANEOUS | Status: DC
  Administered 2016-10-22: 21:00:00 65 mg via SUBCUTANEOUS
  Filled 2016-10-22: qty 0.8

## 2016-10-22 MED ORDER — POTASSIUM CHLORIDE CRYS ER 20 MEQ PO TBCR: 20.0000 meq | EXTENDED_RELEASE_TABLET | Freq: Every day | ORAL | Status: DC

## 2016-10-22 MED ORDER — PROCHLORPERAZINE MALEATE 10 MG PO TABS: 10.0000 mg | ORAL_TABLET | Freq: Four times a day (QID) | ORAL | Status: DC | PRN

## 2016-10-22 MED ORDER — POTASSIUM CHLORIDE IN NACL 20-0.9 MEQ/L-% IV SOLN
INTRAVENOUS | Status: DC
  Administered 2016-10-22: 22:00:00 via INTRAVENOUS
  Filled 2016-10-22 (×3): qty 1000

## 2016-10-22 NOTE — Telephone Encounter (Signed)
Incoming panic lab AST 192 shown to Dr Lindi Adie. Pt is having CT AP done today. Dr Lindi Adie said to have labs repeated on Monday when pt sees Dr Julien Nordmann. Order placed for cbc and cmet and inbasket sent

## 2016-10-22 NOTE — Progress Notes (Signed)
ANTICOAGULATION CONSULT NOTE - Initial Consult  Pharmacy Consult for LMWH Indication: pulmonary embolus  Allergies  Allergen Reactions  . Penicillins Swelling, Rash and Other (See Comments)    Reaction:  Facial swelling and high BP Has patient had a PCN reaction causing immediate rash, facial/tongue/throat swelling, SOB or lightheadedness with hypotension: Yes Has patient had a PCN reaction causing severe rash involving mucus membranes or skin necrosis: No Has patient had a PCN reaction that required hospitalization No Has patient had a PCN reaction occurring within the last 10 years: No If all of the above answers are "NO", then may proceed with Cephalosporin use.    Patient Measurements: Height: '5\' 5"'$  (165.1 cm) Weight: 148 lb (67.1 kg) IBW/kg (Calculated) : 57   Vital Signs: Temp: 98 F (36.7 C) (05/11 1754) Temp Source: Oral (05/11 1754) BP: 118/79 (05/11 1750) Pulse Rate: 108 (05/11 1750)  Labs:  Recent Labs  10/27/2016 1401  HGB 10.3*  HCT 31.7*  PLT 86*  CREATININE 0.8    Estimated Creatinine Clearance: 63.9 mL/min (by C-G formula based on SCr of 0.8 mg/dL).   Medical History: Past Medical History:  Diagnosis Date  . Anemia    in the past  . Arthritis   . Depression   . Encounter for antineoplastic chemotherapy 03/30/2016  . Headache   . Hyperlipidemia   . Hypertension   . Insomnia 06/15/2016  . Lung mass 03/12/2016  . PONV (postoperative nausea and vomiting)    after hysterectomy  . SCL CA dx'd 02/2016  . 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   Pharmacy consulted to dose [place].  Wt 67 kg, HG 10.3, pltc 86 - MD called to say he is aware of low PLTC and d/w oncologist and OK to start LMWH. Pt with hx SCLC.   Goal of Therapy:  Anti-Xa level 0.6-1 units/ml 4hrs after LMWH dose given Monitor platelets by anticoagulation protocol: Yes   Plan:  Lovenox 1 mg/kg sq q12h Could transition to 1.5 mg/kg q24 at  discharge if needed for convenience f/u creat and CBC q72 hrs   Eudelia Bunch, Pharm.D. [phone] 10/13/2016 7:45 PM

## 2016-10-22 NOTE — H&P (Signed)
History and Physical  Patient Name: [name]     UDJ:[id number]    DOB: 1951/03/29    DOA: 11/10/2016 PCP: [name], PA-C   Patient coming from: Home   Chief Complaint: Abnormal imaging, dyspnea, fatigue  HPI: [name] is a 66 y.o. female with a past medical history significant for squamous Lung cancer widely metastatic, HTN who presents with PE by imaging.  Telephonic interpreter offered, declined by patient, who prefers to use son to translate.  The patient was diagnosed with squamous lung cancer in 2017, underwent palliative radiation, then carboplatin and etoposide.  Within last few months, she was referred for peripheral lactic cranial irradiation, but prior to treatment MRI brain showed 36 small metastases. Since then she has been on palliative radiation to the lumbar spine as well as whole brain radiation for the last month.   In the last few weeks, she has been complaining of worsening left-sided abdominal pain or abdominal wall pain, and fatigue, so a week ago her radiation oncology team ordered a CT of the chest abdomen and pelvis which was performed today.  In the interim, in the last few days, the patient's son has noticed a sudden change in her activity (she was ambulating herslef, getting outside a little, somewhat active, but suddenly stopped).  She denies chest pain, but endorses fatigue and dyspnea (and appears to be breathing heavy per son). Today imaging showed saddle pulmonary embolism without right heart strain, new left rib soft tissue metastasis, new right supraclavicular metastasis, and widespread osseous metastasis. She was sent to the emergency room.  ED course: -Afebrile, heart rate 108, respirations 20-22, pressure 118/79, pulse oximetry initially 81% on room air improved to normal with 2L [place] -Na 129, K 2.5, Cr 0.8, WBC 10K, Hgb 9.2 -Platelets 79K -ECG showed sinus tachycardia -TRH were asked to evaluate for saddle PE     ROS: Review of Systems    Constitutional: Positive for fever (intermittent) and malaise/fatigue.  Respiratory: Positive for shortness of breath. Negative for cough, hemoptysis and sputum production.   Cardiovascular: Negative for chest pain.  Gastrointestinal: Positive for abdominal pain.  Genitourinary: Negative for dysuria, frequency, hematuria and urgency.  Musculoskeletal: Positive for back pain.  Neurological: Negative for focal weakness.  All other systems reviewed and are negative.         Past Medical History:  Diagnosis Date  . Anemia    in the past  . Arthritis   . Depression   . Encounter for antineoplastic chemotherapy 03/30/2016  . Headache   . Hyperlipidemia   . Hypertension   . Insomnia 06/15/2016  . Lung mass 03/12/2016  . PONV (postoperative nausea and vomiting)    after hysterectomy  . SCL CA dx'd 02/2016  . Shortness of breath dyspnea    with exertion    Past Surgical History:  Procedure Laterality Date  . ABDOMINAL HYSTERECTOMY     2009 , due to fobroids, benign path for uterus, tumor and also cervix.   [facility] BACK SURGERY     lower back  . COLONOSCOPY    . VIDEO BRONCHOSCOPY WITH ENDOBRONCHIAL ULTRASOUND N/A 03/15/2016   Procedure: VIDEO BRONCHOSCOPY WITH ENDOBRONCHIAL ULTRASOUND AND BIOPSY OF LEVEL SEVEN LYMPH NODE;  Surgeon: Grace Isaac, MD;  Location: [facility] OR;  Service: Thoracic;  Laterality: N/A;    Social History: Patient lives with her husband, son, grandson and daughter.  The patient walks unassisted.  She is a former smoker.  She is from [place].  She was a Engineer, building services.    Allergies  Allergen Reactions  . Penicillins Swelling, Rash and Other (See Comments)    Reaction:  Facial swelling and high BP Has patient had a PCN reaction causing immediate rash, facial/tongue/throat swelling, SOB or lightheadedness with hypotension: Yes Has patient had a PCN reaction causing severe rash involving mucus membranes or skin necrosis: No Has patient had a PCN reaction that required  hospitalization No Has patient had a PCN reaction occurring within the last 10 years: No If all of the above answers are "NO", then may proceed with Cephalosporin use.    Family history: family history includes Cancer (age of onset: 59) in her father.  Prior to Admission medications   Medication Sig Start Date End Date Taking? Authorizing Provider  acetaminophen (TYLENOL) 500 MG tablet Take 500 mg by mouth every 6 (six) hours as needed for mild pain, moderate pain, fever or headache.   Yes [provider]  benzonatate (TESSALON) 100 MG capsule Take 100-200 mg by mouth 3 (three) times daily as needed for cough.   Yes [provider]  gabapentin (NEURONTIN) 300 MG capsule Take 1 capsule (300 mg total) by mouth 3 (three) times daily. 10/12/16  Yes Hayden Pedro, PA-C  hydrochlorothiazide (HYDRODIURIL) 25 MG tablet TAKE 1 TABLET(25 MG) BY MOUTH DAILY 10/01/16  Yes Timmothy Euler, [place] D, PA-C  LORazepam (ATIVAN) 1 MG tablet Take 0.5-1 tablets (0.5-1 mg total) by mouth every 8 (eight) hours as needed for anxiety or sleep. 09/09/16  Yes Hayden Pedro, PA-C  Oxycodone HCl 10 MG TABS Take 1-2 tablets po q 4-6 hours prn pain Patient taking differently: Take 10-20 mg by mouth every 4 (four) hours as needed (for pain).  10/12/16  Yes Hayden Pedro, PA-C  prochlorperazine (COMPAZINE) 10 MG tablet Take 1 tablet (10 mg total) by mouth every 6 (six) hours as needed for nausea or vomiting. 07/06/16  Yes Curt Bears, MD  temazepam (RESTORIL) 30 MG capsule TAKE 1 CAPSULE BY MOUTH EVERY DAY AT BEDTIME AS NEEDED Patient taking differently: Take 1 capsule by mouth at bedtime 09/18/16  Yes Curt Bears, MD       Physical Exam: BP 118/79 (BP Location: Right Arm)   Pulse (!) 108   Temp 98 F (36.7 C) (Oral)   Resp 20   Ht '5\' 5"'$  (1.651 m)   Wt 67.1 kg (148 lb)   SpO2 95%   BMI 24.63 kg/m  General appearance: Well-developed, chronically ill-appearing adult female,  alert and in mild distress from fatigue.   Eyes: Anicteric, conjunctiva pink, lids and lashes normal. PERRL.    ENT: No nasal deformity, discharge, epistaxis.  Hearing normal. OP moist without lesions.   Neck: No neck masses.  Trachea midline.  No thyromegaly/tenderness. Lymph: No cervical or supraclavicular lymphadenopathy. Skin: Warm and dry.  No jaundice.  Pale. Erythema on hands.  No other suspicious rashes or lesions. Cardiac: Tachycardic, regular, nl S1-S2, no murmurs appreciated.  Capillary refill is brisk.  Maybe trace L>R LE edema.  Radial pulses 2+ and symmetric. Respiratory: Normal respiratory rate and rhythm.  CTAB without rales or wheezes. Abdomen: Abdomen soft.  No TTP, there is discomfort in th eleft abdomen, lower ribs, but this is not particularly worse with palpation. No ascites, distension, hepatosplenomegaly.   MSK: No deformities or effusions.  No cyanosis or clubbing. Neuro: Cranial nerves normal.  Sensation intact to light touch. Speech is fluent.  Muscle strength globally very weak.    Psych:  Sensorium intact and responding to questions, attention normal.  Behavior appropriate.  Affect flat.  Judgment and insight appear normal.     Labs on Admission:  I have personally reviewed following labs and imaging studies: CBC:  Recent Labs Lab 10/28/2016 1401 10/19/2016 1909  WBC 11.5* 10.1  NEUTROABS 9.8* PENDING  HGB 10.3* 9.2*  HCT 31.7* 28.0*  MCV 93.2 91.8  PLT 86* PENDING   Basic Metabolic Panel:  Recent Labs Lab 10/21/2016 1401 10/20/2016 1909  NA 133* 129*  K 3.3* 2.5*  CL  --  89*  CO2 28 29  GLUCOSE 179* 153*  BUN 15.7 14  CREATININE 0.8 0.58  CALCIUM 9.9 8.8*   GFR: Estimated Creatinine Clearance: 63.9 mL/min (by C-G formula based on SCr of 0.58 mg/dL).  Liver Function Tests:  Recent Labs Lab 10/29/2016 1401 11/05/2016 1909  AST 192* 144*  ALT 190* 158*  ALKPHOS 436* 359*  BILITOT 2.18* 1.4*  PROT 6.7 5.9*  ALBUMIN 2.5* 2.6*   No results for  input(s): LIPASE, AMYLASE in the last 168 hours. No results for input(s): AMMONIA in the last 168 hours. Coagulation Profile:  Recent Labs Lab 10/21/2016 1909  INR 1.05   Cardiac Enzymes: No results for input(s): CKTOTAL, CKMB, CKMBINDEX, TROPONINI in the last 168 hours. BNP (last 3 results) No results for input(s): PROBNP in the last 8760 hours. HbA1C: No results for input(s): HGBA1C in the last 72 hours. CBG: No results for input(s): GLUCAP in the last 168 hours. Lipid Profile: No results for input(s): CHOL, HDL, LDLCALC, TRIG, CHOLHDL, LDLDIRECT in the last 72 hours. Thyroid Function Tests: No results for input(s): TSH, T4TOTAL, FREET4, T3FREE, THYROIDAB in the last 72 hours. Anemia Panel: No results for input(s): VITAMINB12, FOLATE, FERRITIN, TIBC, IRON, RETICCTPCT in the last 72 hours. Sepsis Labs: Invalid input(s): PROCALCITONIN, LACTICIDVEN No results found for this or any previous visit (from the past 240 hour(s)).       Radiological Exams on Admission: Personally reviewed CT chest/abdomen/pelvis reports: Ct Chest W Contrast  Result Date: 10/23/2016 CLINICAL DATA:  Lung cancer diagnosed in 2017. Chemotherapy and radiation therapy complete. Leg swelling fatigue. Hysterectomy. Right-sided small cell primary. EXAM: CT CHEST, ABDOMEN, AND PELVIS WITH CONTRAST TECHNIQUE: Multidetector CT imaging of the chest, abdomen and pelvis was performed following the standard protocol during bolus administration of intravenous contrast. CONTRAST:  16m ISOVUE-300 IOPAMIDOL (ISOVUE-300) INJECTION 61% COMPARISON:  07/26/2016 and thoracolumbar spine MR 08/28/2016. FINDINGS: CT CHEST FINDINGS Cardiovascular: Aortic atherosclerosis. Borderline cardiomegaly, without pericardial effusion. Lad coronary artery atherosclerosis. Central, saddle type pulmonary embolism, including on image 22/series 2. No evidence of right heart strain. Mediastinum/Nodes: Development of a right supraclavicular nodal mass  which measures 4.1 x 5.1 cm on image 9/series 2. No axillary adenopathy. No residual mediastinal adenopathy. No hilar adenopathy. Lungs/Pleura: Trace right pleural fluid is new. Minimal motion degradation throughout. Bilateral paramediastinal upper lobe ground-glass opacity and septal thickening, likely radiation induced. No residual pulmonary nodules are seen. Musculoskeletal: Soft tissue metastasis peripheral to the posterior left tenth and eleventh ribs is new and measures 4.1 x 1.8 cm on image 50/series 2. Similar widespread osseous metastasis. CT ABDOMEN PELVIS FINDINGS Hepatobiliary: Minimal motion continuing into the abdomen. Subtly heterogeneous hepatic density, without well-defined dominant mass. Hepatomegaly at 21.5 cm craniocaudal. Normal gallbladder, without biliary ductal dilatation. Pancreas: Normal, without mass or ductal dilatation. Spleen: Normal in size, without focal abnormality. Adrenals/Urinary Tract: Minimal left adrenal thickening is unchanged. Right adrenal mass measures 3.3 cm and was consistent with  an adenoma on prior PET. Heterogeneous renal enhancement, greater on the left. Most apparent on delayed images. No hydronephrosis. Normal urinary bladder. Stomach/Bowel: Normal stomach, without wall thickening. Normal colon, appendix, and terminal ileum. Normal small bowel. Vascular/Lymphatic: Advanced aortic and branch vessel atherosclerosis. No abdominopelvic adenopathy. Reproductive: Hysterectomy.  No adnexal mass. Other: No significant free fluid. No evidence of omental or peritoneal disease. Musculoskeletal: Soft tissue/muscular metastasis along the left obturator internus muscle measures 3.0 x 1.9 cm on image 109/ series 2 and is new. Heterogeneous marrow density is again identified and indicative of osseous metastasis. A right-sided L2 lesion measures 1.6 cm on image 67/series 2 and is unchanged. IMPRESSION: 1. Large volume saddle type pulmonary embolism, without right heart strain. These  results will be called to the ordering clinician or representative by the Radiologist Assistant, and communication documented in the PACS or zVision Dashboard. 2. Progressive disease, as evidenced by development of right supraclavicular nodal mass and soft tissue/muscular metastasis as detailed above. 3. Subtle heterogeneous hepatic density which is nonspecific but could represent early hepatic metastasis. Recommend attention on follow-up versus more definitive characterization with pre and post contrast abdominal MRI. 4. New small right pleural effusion. 5. Right adrenal adenoma. 6. Heterogeneous renal enhancement, suspicious for pyelonephritis. Correlate with urinalysis. Electronically Signed   By: Abigail Miyamoto M.D.   On: 11/11/2016 16:29   Ct Abdomen Pelvis W Contrast  Result Date: 10/15/2016 CLINICAL DATA:  Lung cancer diagnosed in 2017. Chemotherapy and radiation therapy complete. Leg swelling fatigue. Hysterectomy. Right-sided small cell primary. EXAM: CT CHEST, ABDOMEN, AND PELVIS WITH CONTRAST TECHNIQUE: Multidetector CT imaging of the chest, abdomen and pelvis was performed following the standard protocol during bolus administration of intravenous contrast. CONTRAST:  133m ISOVUE-300 IOPAMIDOL (ISOVUE-300) INJECTION 61% COMPARISON:  07/26/2016 and thoracolumbar spine MR 08/28/2016. FINDINGS: CT CHEST FINDINGS Cardiovascular: Aortic atherosclerosis. Borderline cardiomegaly, without pericardial effusion. Lad coronary artery atherosclerosis. Central, saddle type pulmonary embolism, including on image 22/series 2. No evidence of right heart strain. Mediastinum/Nodes: Development of a right supraclavicular nodal mass which measures 4.1 x 5.1 cm on image 9/series 2. No axillary adenopathy. No residual mediastinal adenopathy. No hilar adenopathy. Lungs/Pleura: Trace right pleural fluid is new. Minimal motion degradation throughout. Bilateral paramediastinal upper lobe ground-glass opacity and septal  thickening, likely radiation induced. No residual pulmonary nodules are seen. Musculoskeletal: Soft tissue metastasis peripheral to the posterior left tenth and eleventh ribs is new and measures 4.1 x 1.8 cm on image 50/series 2. Similar widespread osseous metastasis. CT ABDOMEN PELVIS FINDINGS Hepatobiliary: Minimal motion continuing into the abdomen. Subtly heterogeneous hepatic density, without well-defined dominant mass. Hepatomegaly at 21.5 cm craniocaudal. Normal gallbladder, without biliary ductal dilatation. Pancreas: Normal, without mass or ductal dilatation. Spleen: Normal in size, without focal abnormality. Adrenals/Urinary Tract: Minimal left adrenal thickening is unchanged. Right adrenal mass measures 3.3 cm and was consistent with an adenoma on prior PET. Heterogeneous renal enhancement, greater on the left. Most apparent on delayed images. No hydronephrosis. Normal urinary bladder. Stomach/Bowel: Normal stomach, without wall thickening. Normal colon, appendix, and terminal ileum. Normal small bowel. Vascular/Lymphatic: Advanced aortic and branch vessel atherosclerosis. No abdominopelvic adenopathy. Reproductive: Hysterectomy.  No adnexal mass. Other: No significant free fluid. No evidence of omental or peritoneal disease. Musculoskeletal: Soft tissue/muscular metastasis along the left obturator internus muscle measures 3.0 x 1.9 cm on image 109/ series 2 and is new. Heterogeneous marrow density is again identified and indicative of osseous metastasis. A right-sided L2 lesion measures 1.6 cm on image 67/series  2 and is unchanged. IMPRESSION: 1. Large volume saddle type pulmonary embolism, without right heart strain. These results will be called to the ordering clinician or representative by the Radiologist Assistant, and communication documented in the PACS or zVision Dashboard. 2. Progressive disease, as evidenced by development of right supraclavicular nodal mass and soft tissue/muscular metastasis  as detailed above. 3. Subtle heterogeneous hepatic density which is nonspecific but could represent early hepatic metastasis. Recommend attention on follow-up versus more definitive characterization with pre and post contrast abdominal MRI. 4. New small right pleural effusion. 5. Right adrenal adenoma. 6. Heterogeneous renal enhancement, suspicious for pyelonephritis. Correlate with urinalysis. Electronically Signed   By: Abigail Miyamoto M.D.   On: 10/17/2016 16:29    EKG: Independently reviewed. Rate 103, QTc 453, no ST changes.         Assessment/Plan  1. Pulmonary embolism:  Discussed risks and benefits of anticoagulation with oncology and with the patient. Brain metastasis from lung Ca have somewhat increased risk of hemorrhage, increased by thrombocytopenia, but still feel benefit outweighs risk.   -Lovenox 1 mg/kg BID -Obtain echocardiogram -Obtain dopplers of legs (I do not appreciate swelling of left arm, although she has discomfort) -Consult to Oncology, appreciate cares -Daily CBC  2. Transaminitis:  Likely congestive from PE  3. Hypokalemia:  -Oral K given -IVF with K -Check Mag  4. Hyponatremia:  Worsening.  In setting of brain mets, squamous Ca, HCTZ. -Hold HCTZ  5. Squamous cancer of the lung:  -Continue gabapentin, lorazepam, Compazine -Continue temazepam for sleep and-consult oncology, appreciate recommendations       DVT prophylaxis: N/A  Code Status: FULL  Family Communication: Son at bedside, CODE STATUS confirmed, but patient undecided  Disposition Plan: Anticipate Lovenox, and Oncology consult.  Will need further hemodynamic monitoring, echocardiogram and likely >2 nights in the hospital Consults called: Oncology Admission status: INPATIENT        Medical decision making: Patient seen at 7:50 PM on 10/31/2016.  The patient was discussed with Dr. Lindi Adie, Dr. Sherry Ruffing.  What exists of the patient's chart was reviewed in depth and summarized above.   Clinical condition: currently HR stable, no hypotension, oxygenation good on nasal cannula.        Edwin Dada [facility] Pager [phone]       At the time of admission, it appears that the appropriate admission status for this patient is INPATIENT. This is judged to be reasonable and necessary in order to provide the required intensity of service to ensure the patient's safety given the presenting symptoms, physical exam findings, and initial radiographic and laboratory data in the context of their chronic comorbidities.  Together, these circumstances are felt to place her at high risk for further clinical deterioration threatening life, limb, or organ.   Patient requires inpatient status due to high intensity of service, high risk for further deterioration and high frequency of surveillance required because of this acute illness that poses a threat to life, limb or bodily function.  I certify that at the point of admission it is my clinical judgment that the patient will require inpatient hospital care spanning beyond 2 midnights from the point of admission and that early discharge would result in unnecessary risk of decompensation and readmission or threat to life, limb or bodily function.

## 2016-10-22 NOTE — ED Triage Notes (Signed)
Per pt, they were sent here d/t finding a pulmonary embolism on the scan that was ordered by oncologist.  Pt in no distress at this time but O2 is at 81% on room air. Pt placed on 3L nasal cannula and O2 is at 97%.  Pt denies any new onset pain. Has a hx of small cell lung cancer.

## 2016-10-22 NOTE — ED Provider Notes (Signed)
[facility] DEPT Provider Note   CSN: [id number] Arrival date & time: 10/18/2016  1728     History   Chief Complaint Chief Complaint  Patient presents with  . Shortness of Breath    HPI [name] is a 66 y.o. female.  The history is provided by the patient, medical records and a relative. The history is limited by a language barrier.  Shortness of Breath  This is a new problem. The average episode lasts 3 days. The problem occurs continuously.The current episode started more than 2 days ago. The problem has not changed since onset.Associated symptoms include leg swelling. Pertinent negatives include no fever, no headaches, no rhinorrhea, no neck pain, no cough, no hemoptysis, no wheezing, no chest pain, no syncope, no vomiting, no abdominal pain and no rash. She has tried nothing for the symptoms. The treatment provided no relief. Associated medical issues do not include CAD or past MI.    Past Medical History:  Diagnosis Date  . Anemia    in the past  . Arthritis   . Depression   . Encounter for antineoplastic chemotherapy 03/30/2016  . Headache   . Hyperlipidemia   . Hypertension   . Insomnia 06/15/2016  . Lung mass 03/12/2016  . PONV (postoperative nausea and vomiting)    after hysterectomy  . SCL CA dx'd 02/2016  . Shortness of breath dyspnea    with exertion    Patient Active Problem List   Diagnosis Date Noted  . Insomnia 06/15/2016  . Encounter for antineoplastic chemotherapy 03/30/2016  . 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 Lung mass 03/12/2016  . Sinus complaint 07/18/2011  . PTSD (post-traumatic stress disorder) 07/18/2011  . SMOKER 03/30/2010  . HTN (hypertension) 03/30/2010  . HYPERCHOLESTEROLEMIA 03/27/2010  . ARTHRITIS 03/27/2010  . CHEST PAIN 03/27/2010    Past Surgical History:  Procedure Laterality Date  . ABDOMINAL HYSTERECTOMY     2009 , due to fobroids, benign path for uterus, tumor and also cervix.   [facility] BACK SURGERY      lower back  . COLONOSCOPY    . VIDEO BRONCHOSCOPY WITH ENDOBRONCHIAL ULTRASOUND N/A 03/15/2016   Procedure: VIDEO BRONCHOSCOPY WITH ENDOBRONCHIAL ULTRASOUND AND BIOPSY OF LEVEL SEVEN LYMPH NODE;  Surgeon: Grace Isaac, MD;  Location: [facility];  Service: Thoracic;  Laterality: N/A;    OB History    No data available       Home Medications    Prior to Admission medications   Medication Sig Start Date End Date Taking? Authorizing Provider  acetaminophen (TYLENOL) 325 MG tablet Take 650 mg by mouth every 6 (six) hours as needed.    [provider]  atorvastatin (LIPITOR) 40 MG tablet Take 40 mg by mouth at bedtime.  03/10/16   [provider]  benzonatate (TESSALON) 100 MG capsule Take 1-2 capsules (100-200 mg total) by mouth 3 (three) times daily as needed for cough. Patient not taking: Reported on 10/12/2016 07/27/16   Curt Bears, MD  dexamethasone (DECADRON) 4 MG tablet Take 1 tablet (4 mg total) by mouth daily. 09/09/16   Hayden Pedro, PA-C  gabapentin (NEURONTIN) 300 MG capsule Take 1 capsule (300 mg total) by mouth 3 (three) times daily. 10/12/16   Hayden Pedro, PA-C  hydrochlorothiazide (HYDRODIURIL) 25 MG tablet TAKE 1 TABLET(25 MG) BY MOUTH DAILY 10/01/16   Tenna Delaine D, PA-C  LORazepam (ATIVAN) 1 MG tablet Take 0.5-1 tablets (0.5-1 mg total) by mouth every 8 (eight)  hours as needed for anxiety or sleep. 09/09/16   Hayden Pedro, PA-C  Oxycodone HCl 10 MG TABS Take 1-2 tablets po q 4-6 hours prn pain 10/12/16   Hayden Pedro, PA-C  prochlorperazine (COMPAZINE) 10 MG tablet Take 1 tablet (10 mg total) by mouth every 6 (six) hours as needed for nausea or vomiting. Patient not taking: Reported on 08/25/2016 07/06/16   Curt Bears, MD  temazepam (RESTORIL) 30 MG capsule TAKE 1 CAPSULE BY MOUTH EVERY DAY AT BEDTIME AS NEEDED 09/18/16   Curt Bears, MD    Family History Family History  Problem Relation Age of Onset  .  Cancer Father 42       Throat Cancer    Social History Social History  Substance Use Topics  . Smoking status: Former Smoker    Packs/day: 1.00    Years: 37.00    Types: Cigarettes    Quit date: 03/10/2016  . Smokeless tobacco: Never Used  . Alcohol use No     Allergies   Penicillins   Review of Systems Review of Systems  Constitutional: Negative for chills, diaphoresis, fatigue and fever.  HENT: Negative for congestion and rhinorrhea.   Eyes: Negative for visual disturbance.  Respiratory: Positive for chest tightness and shortness of breath. Negative for cough, hemoptysis, wheezing and stridor.   Cardiovascular: Positive for leg swelling. Negative for chest pain, palpitations and syncope.  Gastrointestinal: Negative for abdominal pain, constipation, diarrhea, nausea and vomiting.  Genitourinary: Negative for dysuria.  Musculoskeletal: Negative for back pain, neck pain and neck stiffness.  Skin: Negative for rash and wound.  Neurological: Negative for dizziness and headaches.  Psychiatric/Behavioral: Negative for agitation.  All other systems reviewed and are negative.    Physical Exam Updated Vital Signs BP 118/79 (BP Location: Right Arm)   Pulse (!) 108   Temp 98 F (36.7 C) (Oral)   Resp 20   Ht '5\' 5"'$  (1.651 m)   Wt 148 lb (67.1 kg)   SpO2 (!) 81%   BMI 24.63 kg/m   Physical Exam  Constitutional: She appears well-developed and well-nourished. No distress.  HENT:  Head: Normocephalic and atraumatic.  Mouth/Throat: Oropharynx is clear and moist. No oropharyngeal exudate.  Eyes: Conjunctivae and EOM are normal. Pupils are equal, round, and reactive to light.  Neck: Normal range of motion. Neck supple.  Cardiovascular: Regular rhythm.  Tachycardia present.   No murmur heard. Pulmonary/Chest: Effort normal and breath sounds normal. No stridor. Tachypnea noted. No respiratory distress. She has no wheezes. She exhibits no tenderness.  Abdominal: Soft. There is  no tenderness. There is no guarding.  Musculoskeletal: She exhibits no edema or tenderness.  Neurological: She is alert. No sensory deficit. She exhibits normal muscle tone.  Skin: Skin is warm and dry. Capillary refill takes less than 2 seconds. No rash noted. She is not diaphoretic. No erythema.  Psychiatric: She has a normal mood and affect.  Nursing note and vitals reviewed.    ED Treatments / Results  Labs (all labs ordered are listed, but only abnormal results are displayed) Labs Reviewed  CBC WITH DIFFERENTIAL/PLATELET - Abnormal; Notable for the following:       Result Value   RBC 3.05 (*)    Hemoglobin 9.2 (*)    HCT 28.0 (*)    RDW 18.5 (*)    Platelets 79 (*)    Neutro Abs 8.5 (*)    All other components within normal limits  COMPREHENSIVE METABOLIC PANEL - Abnormal; Notable  for the following:    Sodium 129 (*)    Potassium 2.5 (*)    Chloride 89 (*)    Glucose, Bld 153 (*)    Calcium 8.8 (*)    Total Protein 5.9 (*)    Albumin 2.6 (*)    AST 144 (*)    ALT 158 (*)    Alkaline Phosphatase 359 (*)    Total Bilirubin 1.4 (*)    All other components within normal limits  COMPREHENSIVE METABOLIC PANEL - Abnormal; Notable for the following:    Sodium 131 (*)    Potassium 3.3 (*)    Chloride 94 (*)    Glucose, Bld 108 (*)    Calcium 8.8 (*)    Total Protein 5.4 (*)    Albumin 2.3 (*)    AST 123 (*)    ALT 144 (*)    Alkaline Phosphatase 339 (*)    Total Bilirubin 1.6 (*)    All other components within normal limits  CBC - Abnormal; Notable for the following:    RBC 2.98 (*)    Hemoglobin 9.1 (*)    HCT 27.5 (*)    RDW 18.7 (*)    Platelets 70 (*)    All other components within normal limits  APTT - Abnormal; Notable for the following:    aPTT 37 (*)    All other components within normal limits  HEMOGLOBIN AND HEMATOCRIT, BLOOD - Abnormal; Notable for the following:    Hemoglobin 9.3 (*)    HCT 28.6 (*)    All other components within normal limits    PROTIME-INR  PROTIME-INR  MAGNESIUM  MAGNESIUM  PHOSPHORUS  HEMOGLOBIN AND HEMATOCRIT, BLOOD  HEMOGLOBIN AND HEMATOCRIT, BLOOD  I-STAT TROPOININ, ED  TYPE AND SCREEN  ABO/RH    EKG  EKG Interpretation None     EKG did not cross over into chart.    Radiology Ct Chest W Contrast  Result Date: 10/18/2016 CLINICAL DATA:  Lung cancer diagnosed in 2017. Chemotherapy and radiation therapy complete. Leg swelling fatigue. Hysterectomy. Right-sided small cell primary. EXAM: CT CHEST, ABDOMEN, AND PELVIS WITH CONTRAST TECHNIQUE: Multidetector CT imaging of the chest, abdomen and pelvis was performed following the standard protocol during bolus administration of intravenous contrast. CONTRAST:  156m ISOVUE-300 IOPAMIDOL (ISOVUE-300) INJECTION 61% COMPARISON:  07/26/2016 and thoracolumbar spine MR 08/28/2016. FINDINGS: CT CHEST FINDINGS Cardiovascular: Aortic atherosclerosis. Borderline cardiomegaly, without pericardial effusion. Lad coronary artery atherosclerosis. Central, saddle type pulmonary embolism, including on image 22/series 2. No evidence of right heart strain. Mediastinum/Nodes: Development of a right supraclavicular nodal mass which measures 4.1 x 5.1 cm on image 9/series 2. No axillary adenopathy. No residual mediastinal adenopathy. No hilar adenopathy. Lungs/Pleura: Trace right pleural fluid is new. Minimal motion degradation throughout. Bilateral paramediastinal upper lobe ground-glass opacity and septal thickening, likely radiation induced. No residual pulmonary nodules are seen. Musculoskeletal: Soft tissue metastasis peripheral to the posterior left tenth and eleventh ribs is new and measures 4.1 x 1.8 cm on image 50/series 2. Similar widespread osseous metastasis. CT ABDOMEN PELVIS FINDINGS Hepatobiliary: Minimal motion continuing into the abdomen. Subtly heterogeneous hepatic density, without well-defined dominant mass. Hepatomegaly at 21.5 cm craniocaudal. Normal gallbladder,  without biliary ductal dilatation. Pancreas: Normal, without mass or ductal dilatation. Spleen: Normal in size, without focal abnormality. Adrenals/Urinary Tract: Minimal left adrenal thickening is unchanged. Right adrenal mass measures 3.3 cm and was consistent with an adenoma on prior PET. Heterogeneous renal enhancement, greater on the left. Most apparent on delayed  images. No hydronephrosis. Normal urinary bladder. Stomach/Bowel: Normal stomach, without wall thickening. Normal colon, appendix, and terminal ileum. Normal small bowel. Vascular/Lymphatic: Advanced aortic and branch vessel atherosclerosis. No abdominopelvic adenopathy. Reproductive: Hysterectomy.  No adnexal mass. Other: No significant free fluid. No evidence of omental or peritoneal disease. Musculoskeletal: Soft tissue/muscular metastasis along the left obturator internus muscle measures 3.0 x 1.9 cm on image 109/ series 2 and is new. Heterogeneous marrow density is again identified and indicative of osseous metastasis. A right-sided L2 lesion measures 1.6 cm on image 67/series 2 and is unchanged. IMPRESSION: 1. Large volume saddle type pulmonary embolism, without right heart strain. These results will be called to the ordering clinician or representative by the Radiologist Assistant, and communication documented in the PACS or zVision Dashboard. 2. Progressive disease, as evidenced by development of right supraclavicular nodal mass and soft tissue/muscular metastasis as detailed above. 3. Subtle heterogeneous hepatic density which is nonspecific but could represent early hepatic metastasis. Recommend attention on follow-up versus more definitive characterization with pre and post contrast abdominal MRI. 4. New small right pleural effusion. 5. Right adrenal adenoma. 6. Heterogeneous renal enhancement, suspicious for pyelonephritis. Correlate with urinalysis. Electronically Signed   By: Abigail Miyamoto M.D.   On: 11/10/2016 16:29   Ct Abdomen Pelvis  W Contrast  Result Date: 10/18/2016 CLINICAL DATA:  Lung cancer diagnosed in 2017. Chemotherapy and radiation therapy complete. Leg swelling fatigue. Hysterectomy. Right-sided small cell primary. EXAM: CT CHEST, ABDOMEN, AND PELVIS WITH CONTRAST TECHNIQUE: Multidetector CT imaging of the chest, abdomen and pelvis was performed following the standard protocol during bolus administration of intravenous contrast. CONTRAST:  177m ISOVUE-300 IOPAMIDOL (ISOVUE-300) INJECTION 61% COMPARISON:  07/26/2016 and thoracolumbar spine MR 08/28/2016. FINDINGS: CT CHEST FINDINGS Cardiovascular: Aortic atherosclerosis. Borderline cardiomegaly, without pericardial effusion. Lad coronary artery atherosclerosis. Central, saddle type pulmonary embolism, including on image 22/series 2. No evidence of right heart strain. Mediastinum/Nodes: Development of a right supraclavicular nodal mass which measures 4.1 x 5.1 cm on image 9/series 2. No axillary adenopathy. No residual mediastinal adenopathy. No hilar adenopathy. Lungs/Pleura: Trace right pleural fluid is new. Minimal motion degradation throughout. Bilateral paramediastinal upper lobe ground-glass opacity and septal thickening, likely radiation induced. No residual pulmonary nodules are seen. Musculoskeletal: Soft tissue metastasis peripheral to the posterior left tenth and eleventh ribs is new and measures 4.1 x 1.8 cm on image 50/series 2. Similar widespread osseous metastasis. CT ABDOMEN PELVIS FINDINGS Hepatobiliary: Minimal motion continuing into the abdomen. Subtly heterogeneous hepatic density, without well-defined dominant mass. Hepatomegaly at 21.5 cm craniocaudal. Normal gallbladder, without biliary ductal dilatation. Pancreas: Normal, without mass or ductal dilatation. Spleen: Normal in size, without focal abnormality. Adrenals/Urinary Tract: Minimal left adrenal thickening is unchanged. Right adrenal mass measures 3.3 cm and was consistent with an adenoma on prior PET.  Heterogeneous renal enhancement, greater on the left. Most apparent on delayed images. No hydronephrosis. Normal urinary bladder. Stomach/Bowel: Normal stomach, without wall thickening. Normal colon, appendix, and terminal ileum. Normal small bowel. Vascular/Lymphatic: Advanced aortic and branch vessel atherosclerosis. No abdominopelvic adenopathy. Reproductive: Hysterectomy.  No adnexal mass. Other: No significant free fluid. No evidence of omental or peritoneal disease. Musculoskeletal: Soft tissue/muscular metastasis along the left obturator internus muscle measures 3.0 x 1.9 cm on image 109/ series 2 and is new. Heterogeneous marrow density is again identified and indicative of osseous metastasis. A right-sided L2 lesion measures 1.6 cm on image 67/series 2 and is unchanged. IMPRESSION: 1. Large volume saddle type pulmonary embolism, without right heart strain.  These results will be called to the ordering clinician or representative by the Radiologist Assistant, and communication documented in the PACS or zVision Dashboard. 2. Progressive disease, as evidenced by development of right supraclavicular nodal mass and soft tissue/muscular metastasis as detailed above. 3. Subtle heterogeneous hepatic density which is nonspecific but could represent early hepatic metastasis. Recommend attention on follow-up versus more definitive characterization with pre and post contrast abdominal MRI. 4. New small right pleural effusion. 5. Right adrenal adenoma. 6. Heterogeneous renal enhancement, suspicious for pyelonephritis. Correlate with urinalysis. Electronically Signed   By: Abigail Miyamoto M.D.   On: 10/18/2016 16:29    Procedures Procedures (including critical care time)  Medications Ordered in ED Medications  morphine 4 MG/ML injection 2-4 mg (2 mg Intravenous Given 10/23/16 1025)  gabapentin (NEURONTIN) capsule 300 mg (300 mg Oral Given 10/23/16 1024)  temazepam (RESTORIL) capsule 30 mg (30 mg Oral Given 10/24/2016  2128)  LORazepam (ATIVAN) tablet 0.5-1 mg (not administered)  prochlorperazine (COMPAZINE) tablet 10 mg (not administered)  sodium chloride flush (NS) 0.9 % injection 3 mL (3 mLs Intravenous Not Given 10/23/16 1000)  ondansetron (ZOFRAN) tablet 4 mg (not administered)    Or  ondansetron (ZOFRAN) injection 4 mg (not administered)  0.9 % NaCl with KCl 20 mEq/ L  infusion ( Intravenous Rate/Dose Change 10/23/16 0941)  MEDLINE mouth rinse (15 mLs Mouth Rinse Given 10/23/16 1024)  enoxaparin (LOVENOX) injection 70 mg (70 mg Subcutaneous Not Given 10/23/16 1000)  potassium chloride SA (K-DUR,KLOR-CON) CR tablet 40 mEq (40 mEq Oral Given 10/23/16 1024)  pantoprazole (PROTONIX) 80 mg in sodium chloride 0.9 % 250 mL (0.32 mg/mL) infusion (not administered)  pantoprazole (PROTONIX) injection 40 mg (not administered)  pantoprazole (PROTONIX) 80 mg in sodium chloride 0.9 % 100 mL IVPB (80 mg Intravenous New Bag/Given 10/23/16 1236)     Initial Impression / Assessment and Plan / ED Course  I have reviewed the triage vital signs and the nursing notes.  Pertinent labs & imaging results that were available during my care of the patient were reviewed by me and considered in my medical decision making (see chart for details).     Liel Lomanto is a 66 y.o. female with a past medical history significant for lung cancer with metastasis to the brain who presents at the direction of her oncologist for newly discovered pulmonary embolism. Patient is currently by her son who reports that she has had some shortness of breath and difficulty taking deep breaths or the last few days. He says that she has also been slightly sore in her legs and face. She denies any chest pain but he says she has been having some mild fevers and chills intermittently. She says that she always has pain in her left upper abdomen from the cancer and that is unchanged. Patient denies any headaches, vision changes, neck pain, or other symptoms. She  denies any current chest pressure or pain.  On exam, patient's lungs are slightly coarse. No significant wheezing. No chest or back tenderness. Abdomen nontender. Patient has mild edema in both lower extremities. Left worse than the right. Patient had no focal neurologic deficits on my exam.  Review of chart shows that patient had imaging today revealing a saddle pulmonary embolism. No evidence of heart strain on imaging. An EKG was performed and showed inverted T-wave in V3 was the only EKG change. N/A.  Patient will have repeat blood work ordered as well as troponin.   Patient will need admission as  she knows nausea or warm at 2 L for having an oxygen saturation of 81% on room air on arrival. Given patient's 30+ metastases to the brain, do not feel patient should be started on heparin at this time. Will contact admitting team for admission and further management.   Final Clinical Impressions(s) / ED Diagnoses   Final diagnoses:  Acute saddle pulmonary embolism without acute cor pulmonale (HCC)     Clinical Impression: 1. Acute saddle pulmonary embolism without acute cor pulmonale ([place])     Disposition: Admit to Hospitalist service    Tegeler, Gwenyth Allegra, MD 10/23/16 1332

## 2016-10-23 ENCOUNTER — Inpatient Hospital Stay (HOSPITAL_COMMUNITY): Payer: No Typology Code available for payment source

## 2016-10-23 DIAGNOSIS — D649 Anemia, unspecified: Secondary | ICD-10-CM

## 2016-10-23 DIAGNOSIS — E876 Hypokalemia: Secondary | ICD-10-CM

## 2016-10-23 DIAGNOSIS — R7989 Other specified abnormal findings of blood chemistry: Secondary | ICD-10-CM

## 2016-10-23 DIAGNOSIS — I10 Essential (primary) hypertension: Secondary | ICD-10-CM

## 2016-10-23 DIAGNOSIS — R945 Abnormal results of liver function studies: Secondary | ICD-10-CM

## 2016-10-23 DIAGNOSIS — C3411 Malignant neoplasm of upper lobe, right bronchus or lung: Secondary | ICD-10-CM

## 2016-10-23 DIAGNOSIS — C7951 Secondary malignant neoplasm of bone: Secondary | ICD-10-CM

## 2016-10-23 DIAGNOSIS — E871 Hypo-osmolality and hyponatremia: Secondary | ICD-10-CM

## 2016-10-23 DIAGNOSIS — R740 Nonspecific elevation of levels of transaminase and lactic acid dehydrogenase [LDH]: Secondary | ICD-10-CM

## 2016-10-23 DIAGNOSIS — K922 Gastrointestinal hemorrhage, unspecified: Secondary | ICD-10-CM

## 2016-10-23 DIAGNOSIS — C3491 Malignant neoplasm of unspecified part of right bronchus or lung: Secondary | ICD-10-CM

## 2016-10-23 LAB — APTT: aPTT: 37 seconds — ABNORMAL HIGH (ref 24–36)

## 2016-10-23 LAB — COMPREHENSIVE METABOLIC PANEL
ALBUMIN: 2.3 g/dL — AB (ref 3.5–5.0)
ALK PHOS: 339 U/L — AB (ref 38–126)
ALT: 144 U/L — ABNORMAL HIGH (ref 14–54)
ANION GAP: 9 (ref 5–15)
AST: 123 U/L — AB (ref 15–41)
BILIRUBIN TOTAL: 1.6 mg/dL — AB (ref 0.3–1.2)
BUN: 12 mg/dL (ref 6–20)
CALCIUM: 8.8 mg/dL — AB (ref 8.9–10.3)
CO2: 28 mmol/L (ref 22–32)
Chloride: 94 mmol/L — ABNORMAL LOW (ref 101–111)
Creatinine, Ser: 0.5 mg/dL (ref 0.44–1.00)
GFR calc Af Amer: >60 mL/min (ref >60)
GFR calc non Af Amer: >60 mL/min (ref >60)
Glucose, Bld: 108 mg/dL — ABNORMAL HIGH (ref 65–99)
POTASSIUM: 3.3 mmol/L — AB (ref 3.5–5.1)
SODIUM: 131 mmol/L — AB (ref 135–145)
TOTAL PROTEIN: 5.4 g/dL — AB (ref 6.5–8.1)

## 2016-10-23 LAB — ECHOCARDIOGRAM COMPLETE
AVLVOTPG: 9 mmHg
CHL CUP DOP CALC LVOT VTI: 23.9 cm
CHL CUP TV REG PEAK VELOCITY: 323 cm/s
E/e' ratio: 8.27
FS: 29 % (ref 28–44)
Height: 65 in
IV/PV OW: 1.25
LA ID, A-P, ES: 29 mm
LA diam index: 1.64 cm/m2
LA vol A4C: 27.5 ml
LA vol: 30.3 mL
LAVOLIN: 17.1 mL/m2
LDCA: 2.84 cm2
LEFT ATRIUM END SYS DIAM: 29 mm
LV E/e' medial: 8.27
LV E/e'average: 8.27
LV PW d: 7.97 mm — AB (ref 0.6–1.1)
LV TDI E'LATERAL: 11.1
LV e' LATERAL: 11.1 cm/s
LVOT SV: 68 mL
LVOTD: 19 mm
LVOTPV: 153 cm/s
Lateral S' vel: 15.2 cm/s
MV Peak grad: 3 mmHg
MV pk A vel: 102 m/s
MV pk E vel: 91.8 m/s
RV sys press: 45 mmHg
TAPSE: 19.8 mm
TDI e' medial: 7.62
TR max vel: 323 cm/s
Weight: 2476.21 oz

## 2016-10-23 LAB — MAGNESIUM: Magnesium: 1.7 mg/dL (ref 1.7–2.4)

## 2016-10-23 LAB — CBC
HCT: 27.5 % — ABNORMAL LOW (ref 36.0–46.0)
HEMOGLOBIN: 9.1 g/dL — AB (ref 12.0–15.0)
MCH: 30.5 pg (ref 26.0–34.0)
MCHC: 33.1 g/dL (ref 30.0–36.0)
MCV: 92.3 fL (ref 78.0–100.0)
Platelets: 70 10*3/uL — ABNORMAL LOW (ref 150–400)
RBC: 2.98 MIL/uL — AB (ref 3.87–5.11)
RDW: 18.7 % — ABNORMAL HIGH (ref 11.5–15.5)
WBC: 10.4 10*3/uL (ref 4.0–10.5)

## 2016-10-23 LAB — HEMOGLOBIN AND HEMATOCRIT, BLOOD
HCT: 28.6 % — ABNORMAL LOW (ref 36.0–46.0)
HCT: 29.3 % — ABNORMAL LOW (ref 36.0–46.0)
HEMOGLOBIN: 9.5 g/dL — AB (ref 12.0–15.0)
Hemoglobin: 9.3 g/dL — ABNORMAL LOW (ref 12.0–15.0)

## 2016-10-23 LAB — PREPARE RBC (CROSSMATCH)

## 2016-10-23 LAB — PROTIME-INR
INR: 1.07
PROTHROMBIN TIME: 14 s (ref 11.4–15.2)

## 2016-10-23 LAB — PHOSPHORUS: Phosphorus: 2.9 mg/dL (ref 2.5–4.6)

## 2016-10-23 MED ORDER — POTASSIUM CHLORIDE CRYS ER 20 MEQ PO TBCR
40.0000 meq | EXTENDED_RELEASE_TABLET | Freq: Two times a day (BID) | ORAL | Status: AC
  Administered 2016-10-23 (×2): 40 meq via ORAL
  Filled 2016-10-23 (×2): qty 2

## 2016-10-23 MED ORDER — POTASSIUM CHLORIDE CRYS ER 20 MEQ PO TBCR: 40.0000 meq | EXTENDED_RELEASE_TABLET | Freq: Two times a day (BID) | ORAL | Status: DC

## 2016-10-23 MED ORDER — PANTOPRAZOLE SODIUM 40 MG IV SOLR: 40.0000 mg | Freq: Two times a day (BID) | INTRAVENOUS | Status: DC

## 2016-10-23 MED ORDER — SODIUM CHLORIDE 0.9 % IV SOLN: Freq: Once | INTRAVENOUS | Status: DC

## 2016-10-23 MED ORDER — SODIUM CHLORIDE 0.9 % IV SOLN
80.0000 mg | Freq: Once | INTRAVENOUS | Status: AC
  Administered 2016-10-23: 13:00:00 80 mg via INTRAVENOUS
  Filled 2016-10-23: qty 80

## 2016-10-23 MED ORDER — ENOXAPARIN SODIUM 80 MG/0.8ML ~~LOC~~ SOLN
70.0000 mg | Freq: Two times a day (BID) | SUBCUTANEOUS | Status: DC
  Filled 2016-10-23 (×2): qty 0.8

## 2016-10-23 MED ORDER — SODIUM CHLORIDE 0.9 % IV SOLN
8.0000 mg/h | INTRAVENOUS | Status: DC
  Administered 2016-10-23 – 2016-10-24 (×3): 8 mg/h via INTRAVENOUS
  Filled 2016-10-23 (×5): qty 80

## 2016-10-23 NOTE — Progress Notes (Signed)
PROGRESS NOTE    [name]  MPN:[id number] DOB: 1951/02/23 DOA: 10/23/2016 PCP: [name], PA-C  Brief Narrative:  Menor is a 66 y.o. female with a past medical history significant for squamous Lung cancer widely metastatic, HTN who presented with PE by imaging, dyspena and fatigue. The patient was diagnosed with squamous lung cancer in 2017, underwent palliative radiation, then carboplatin and etoposide.  Within last few months, she was referred for peripheral lactic cranial irradiation, but prior to treatment MRI brain showed 36 small metastases. Since then she has been on palliative radiation to the lumbar spine as well as whole brain radiation for the last month.   In the last few weeks, she has been complaining of worsening left-sided abdominal pain or abdominal wall pain, and fatigue, so a week ago her radiation oncology team ordered a CT of the chest abdomen and pelvis which was performed today.  In the interim, in the last few days, the patient's son has noticed a sudden change in her activity (she was ambulating herself, getting outside a little, somewhat active, but suddenly stopped). She denied chest pain, but endorses fatigue and dyspnea (and appears to be breathing heavy per son). Imaging showed saddle pulmonary embolism without right heart strain, new left rib soft tissue metastasis, new right supraclavicular metastasis, and widespread osseous metastasis. She was sent to the emergency room. She was admitted and anticoagulated with Lovenox Full dose and Oncology was consulted. Unfortunately patient developed an acute GI Bleed so A/C was held and Gastroenterology was consulted for evaluation so she will be undergoing a NM Scan. Palliative Care Medicine was consulted for goals of Care as patient wants all treatment interventions offered. High risk for decompensation.  Assessment & Plan:   Principal Problem:   Acute saddle pulmonary embolism without acute cor pulmonale  (HCC) Active Problems:   Essential hypertension   Small cell carcinoma of lung, right (HCC)   Hyponatremia   Hypokalemia   Normocytic anemia   Transaminitis   Thrombocytopenia (HCC)  1. Acute Saddle Pulmonary Embolism -Discussed risks and benefits of anticoagulation with oncology and with the patient. Brain metastasis from lung Ca have somewhat increased risk of hemorrhage, increased by thrombocytopenia, but still feel benefit outweighs risk.   -Lovenox was started 1 mg/kg BID but patient developed and acute Lower GI Bleed so Lovenox Was held -Obtained Echocardiogram and showed Normal EF of 65-70% -Obtain Dopplers of legs (I do not appreciate swelling of left arm, although she has discomfort) -Ordered and Pending -Consulted Oncology Dr. Lindi Adie for evaluation and further assistance and recommended switching Lovenox to Xarelto upon discharge but this was prior to her having a GI Bleed -Discussed case with Dr. Lindi Adie and recommended to hold A/C and monitor H/H's and transfuse 2 units prophylactically; Transfusion held at this time as GI recommends not to transfuse unless <8 -Palliative Care Medicine Consulted for Goals of Care as it was originally looking like patient wanted to go to Hospice but now wants all offered interventions -Monitor H/H q6h   2. Acute Lower GI bleed  -In the setting of Metastatic Cancer and Lovenox A/C for Saddle PE -Type and Screen -Protonix Gtt -Consulted Gastroenterology Dr. Alessandra Bevels -Suspect Etiology from Diverticular Bleed vs. Angiectasia vs. Hemorrhoidal Bleed -Family wants all aggressive measures at this point -Risk of Invasive Procedure extremly high so NM bleeding Scan ordered -If bleeding scan shows active Bleeding consult IR for Angiography and Embolization -Continue to Monitor H/H q6h and Transfuse if Hb <8 -Hb/Hct currently stable  at 9.3/28.6 (Down from 10.3/31.7 on Admission) -Repeat CMP in AM  3. Abnormal LFT's/Transaminitis:  -AST went from  192 -> 123 and ALT went from 190 -> 144 -Likely congestive from PE -There was a subtle herterogenous hepatic densitidy which is nonspecific but could represent early hepatic metastasis. Recommend Pre/Post Contrast Abdominal MRI  4. Hypokalemia:  -Patient's K+ was 3.3 this AM -Oral K given -IVF with K -Checked Mag and was 1.7 -Replete as Necessary  -Continue to Monitor and Repeat CMP in AM  5. Hyponatremia:  -Na+ went from 129 -> 131 -C/w IVF NS at 50 mL/hr + KCl -Repeat CMP in AM  6. Squamous Cancer of the Lung with Diffuse Metastasis: -CT Abdomen showed Progressive disease, as evidenced by development of right supraclavicular nodal mass and soft tissue/muscular metastasis as well as the Left Chest Wall 10th and 11th ribs. -Continue gabapentin, lorazepam, Compazine -Continue temazepam for sleep and -Consulted Oncology Dr. Lindi Adie and he feels as if systemic chemotherapy can be difficult to tolerate without great results. -Palliative Care Medicine Consulted for Goals of Care and Meeting for tomorrow Afternoon  7. Thrombocytopenia -Worsening; Suspected to Chemo and XRT treaments -Continue to Monitorand repeat CBC in AM  8. Acute Respiratory Failure with Hypoxia -Likely 2/2 to Saddle PE -C/w Supplemental O2 via [place] and Maintain O2 Saturations > 90% -Continuous Pulse Oximetry and Wean O2 as tolerated  DVT prophylaxis: Full Dose Lovenox Held; SCDs will be placed once know if she doesn't have an blood clots in LE Code Status: FULL CODE Family Communication: Discussed with Daughter and Son at Bedside Disposition Plan: Pending family meeting and discussion  Consultants:   Oncology Dr. Lindi Adie  Gastroenterology Dr. Alessandra Bevels  Palliative Care Medicine Dr. Domingo Cocking   Procedures:  ECHOCARDIOGRAM Study Conclusions  - Left ventricle: The cavity size was normal. Systolic function was   vigorous. The estimated ejection fraction was in the range of 65%   to 70%. Wall motion was  normal; there were no regional wall   motion abnormalities. - Tricuspid valve: There was mild-moderate regurgitation. - Pulmonary arteries: Systolic pressure was mildly to moderately   increased. PA peak pressure: 45 mm Hg (S).  Antimicrobials:  Anti-infectives    None     Subjective: Seen and examined and daughter and son translated as she refused to have the translator app. She stated her Left shoulder was hurting. Subsequently she developed a GI bleed and wanted to know where the bleed was coming from. No nausea or vomiting.   Objective: Vitals:   10/12/2016 1850 11/08/2016 2021 10/20/2016 2110 10/23/16 0440  BP:  121/79 121/71 119/65  Pulse:  (!) 106 (!) 108 (!) 111  Resp:  (!) 24 (!) 28 20  Temp:   98.7 F (37.1 C) 98.8 F (37.1 C)  TempSrc:   Oral Oral  SpO2: 95% 94% 97% 94%  Weight:   70.2 kg (154 lb 12.2 oz)   Height:   '5\' 5"'$  (1.651 m)     Intake/Output Summary (Last 24 hours) at 10/23/16 0749 Last data filed at 10/23/16 0600  Gross per 24 hour  Intake              800 ml  Output                0 ml  Net              800 ml   Filed Weights   11/01/2016 1751 10/17/2016 2110  Weight: 67.1 kg (148  lb) 70.2 kg (154 lb 12.2 oz)   Examination: Physical Exam:  Constitutional: ill appearing female appears uncomfortable and in mild distress Eyes: Lids and conjunctivae normal, sclerae anicteric  ENMT: External Ears, Nose appear normal. Grossly normal hearing.  Neck: Appears normal, supple, no cervical masses, normal ROM, no appreciable thyromegaly, no JVD Respiratory: Diminished to auscultation bilaterally, no wheezing, rales, rhonchi or crackles. Normal respiratory effort and patient is not tachypenic. No accessory muscle use.  Cardiovascular: Tachycardic Rate an Rhythm, no murmurs / rubs / gallops. S1 and S2 auscultated.  No appreciable edema.   Abdomen: Soft, Not tender to palpate, non-distended. No masses palpated. No appreciable hepatosplenomegaly. Bowel sounds positive x4.    GU: Deferred. Musculoskeletal: No clubbing / cyanosis of digits/nails. No joint deformity upper and lower extremities.  Skin: No rashes, lesions, ulcers. No induration; Warm and dry.  Neurologic: CN 2-12 grossly intact with no focal deficits. Romberg sign cerebellar reflexes not assessed.  Psychiatric: Normal judgment and insight. Alert and oriented x 3. Depressed mood and flat affect.   Data Reviewed: I have personally reviewed following labs and imaging studies  CBC:  Recent Labs Lab 10/28/2016 1401 11/10/2016 1909 10/23/16 0600  WBC 11.5* 10.1 10.4  NEUTROABS 9.8* 8.5*  --   HGB 10.3* 9.2* 9.1*  HCT 31.7* 28.0* 27.5*  MCV 93.2 91.8 92.3  PLT 86* 79* 70*   Basic Metabolic Panel:  Recent Labs Lab 10/19/2016 1401 10/29/2016 1909  NA 133* 129*  K 3.3* 2.5*  CL  --  89*  CO2 28 29  GLUCOSE 179* 153*  BUN 15.7 14  CREATININE 0.8 0.58  CALCIUM 9.9 8.8*  MG  --  1.8   GFR: Estimated Creatinine Clearance: 69.9 mL/min (by C-G formula based on SCr of 0.58 mg/dL). Liver Function Tests:  Recent Labs Lab 11/01/2016 1401 10/31/2016 1909  AST 192* 144*  ALT 190* 158*  ALKPHOS 436* 359*  BILITOT 2.18* 1.4*  PROT 6.7 5.9*  ALBUMIN 2.5* 2.6*   No results for input(s): LIPASE, AMYLASE in the last 168 hours. No results for input(s): AMMONIA in the last 168 hours. Coagulation Profile:  Recent Labs Lab 10/21/2016 1909 10/23/16 0600  INR 1.05 1.07   Cardiac Enzymes: No results for input(s): CKTOTAL, CKMB, CKMBINDEX, TROPONINI in the last 168 hours. BNP (last 3 results) No results for input(s): PROBNP in the last 8760 hours. HbA1C: No results for input(s): HGBA1C in the last 72 hours. CBG: No results for input(s): GLUCAP in the last 168 hours. Lipid Profile: No results for input(s): CHOL, HDL, LDLCALC, TRIG, CHOLHDL, LDLDIRECT in the last 72 hours. Thyroid Function Tests: No results for input(s): TSH, T4TOTAL, FREET4, T3FREE, THYROIDAB in the last 72 hours. Anemia Panel: No  results for input(s): VITAMINB12, FOLATE, FERRITIN, TIBC, IRON, RETICCTPCT in the last 72 hours. Sepsis Labs: No results for input(s): PROCALCITON, LATICACIDVEN in the last 168 hours.  No results found for this or any previous visit (from the past 240 hour(s)).   Radiology Studies: Ct Chest W Contrast  Result Date: 11/06/2016 CLINICAL DATA:  Lung cancer diagnosed in 2017. Chemotherapy and radiation therapy complete. Leg swelling fatigue. Hysterectomy. Right-sided small cell primary. EXAM: CT CHEST, ABDOMEN, AND PELVIS WITH CONTRAST TECHNIQUE: Multidetector CT imaging of the chest, abdomen and pelvis was performed following the standard protocol during bolus administration of intravenous contrast. CONTRAST:  170m ISOVUE-300 IOPAMIDOL (ISOVUE-300) INJECTION 61% COMPARISON:  07/26/2016 and thoracolumbar spine MR 08/28/2016. FINDINGS: CT CHEST FINDINGS Cardiovascular: Aortic atherosclerosis. Borderline cardiomegaly, without  pericardial effusion. Lad coronary artery atherosclerosis. Central, saddle type pulmonary embolism, including on image 22/series 2. No evidence of right heart strain. Mediastinum/Nodes: Development of a right supraclavicular nodal mass which measures 4.1 x 5.1 cm on image 9/series 2. No axillary adenopathy. No residual mediastinal adenopathy. No hilar adenopathy. Lungs/Pleura: Trace right pleural fluid is new. Minimal motion degradation throughout. Bilateral paramediastinal upper lobe ground-glass opacity and septal thickening, likely radiation induced. No residual pulmonary nodules are seen. Musculoskeletal: Soft tissue metastasis peripheral to the posterior left tenth and eleventh ribs is new and measures 4.1 x 1.8 cm on image 50/series 2. Similar widespread osseous metastasis. CT ABDOMEN PELVIS FINDINGS Hepatobiliary: Minimal motion continuing into the abdomen. Subtly heterogeneous hepatic density, without well-defined dominant mass. Hepatomegaly at 21.5 cm craniocaudal. Normal  gallbladder, without biliary ductal dilatation. Pancreas: Normal, without mass or ductal dilatation. Spleen: Normal in size, without focal abnormality. Adrenals/Urinary Tract: Minimal left adrenal thickening is unchanged. Right adrenal mass measures 3.3 cm and was consistent with an adenoma on prior PET. Heterogeneous renal enhancement, greater on the left. Most apparent on delayed images. No hydronephrosis. Normal urinary bladder. Stomach/Bowel: Normal stomach, without wall thickening. Normal colon, appendix, and terminal ileum. Normal small bowel. Vascular/Lymphatic: Advanced aortic and branch vessel atherosclerosis. No abdominopelvic adenopathy. Reproductive: Hysterectomy.  No adnexal mass. Other: No significant free fluid. No evidence of omental or peritoneal disease. Musculoskeletal: Soft tissue/muscular metastasis along the left obturator internus muscle measures 3.0 x 1.9 cm on image 109/ series 2 and is new. Heterogeneous marrow density is again identified and indicative of osseous metastasis. A right-sided L2 lesion measures 1.6 cm on image 67/series 2 and is unchanged. IMPRESSION: 1. Large volume saddle type pulmonary embolism, without right heart strain. These results will be called to the ordering clinician or representative by the Radiologist Assistant, and communication documented in the PACS or zVision Dashboard. 2. Progressive disease, as evidenced by development of right supraclavicular nodal mass and soft tissue/muscular metastasis as detailed above. 3. Subtle heterogeneous hepatic density which is nonspecific but could represent early hepatic metastasis. Recommend attention on follow-up versus more definitive characterization with pre and post contrast abdominal MRI. 4. New small right pleural effusion. 5. Right adrenal adenoma. 6. Heterogeneous renal enhancement, suspicious for pyelonephritis. Correlate with urinalysis. Electronically Signed   By: Abigail Miyamoto M.D.   On: 11/01/2016 16:29   Ct  Abdomen Pelvis W Contrast  Result Date: 11/05/2016 CLINICAL DATA:  Lung cancer diagnosed in 2017. Chemotherapy and radiation therapy complete. Leg swelling fatigue. Hysterectomy. Right-sided small cell primary. EXAM: CT CHEST, ABDOMEN, AND PELVIS WITH CONTRAST TECHNIQUE: Multidetector CT imaging of the chest, abdomen and pelvis was performed following the standard protocol during bolus administration of intravenous contrast. CONTRAST:  125m ISOVUE-300 IOPAMIDOL (ISOVUE-300) INJECTION 61% COMPARISON:  07/26/2016 and thoracolumbar spine MR 08/28/2016. FINDINGS: CT CHEST FINDINGS Cardiovascular: Aortic atherosclerosis. Borderline cardiomegaly, without pericardial effusion. Lad coronary artery atherosclerosis. Central, saddle type pulmonary embolism, including on image 22/series 2. No evidence of right heart strain. Mediastinum/Nodes: Development of a right supraclavicular nodal mass which measures 4.1 x 5.1 cm on image 9/series 2. No axillary adenopathy. No residual mediastinal adenopathy. No hilar adenopathy. Lungs/Pleura: Trace right pleural fluid is new. Minimal motion degradation throughout. Bilateral paramediastinal upper lobe ground-glass opacity and septal thickening, likely radiation induced. No residual pulmonary nodules are seen. Musculoskeletal: Soft tissue metastasis peripheral to the posterior left tenth and eleventh ribs is new and measures 4.1 x 1.8 cm on image 50/series 2. Similar widespread osseous metastasis. CT  ABDOMEN PELVIS FINDINGS Hepatobiliary: Minimal motion continuing into the abdomen. Subtly heterogeneous hepatic density, without well-defined dominant mass. Hepatomegaly at 21.5 cm craniocaudal. Normal gallbladder, without biliary ductal dilatation. Pancreas: Normal, without mass or ductal dilatation. Spleen: Normal in size, without focal abnormality. Adrenals/Urinary Tract: Minimal left adrenal thickening is unchanged. Right adrenal mass measures 3.3 cm and was consistent with an adenoma  on prior PET. Heterogeneous renal enhancement, greater on the left. Most apparent on delayed images. No hydronephrosis. Normal urinary bladder. Stomach/Bowel: Normal stomach, without wall thickening. Normal colon, appendix, and terminal ileum. Normal small bowel. Vascular/Lymphatic: Advanced aortic and branch vessel atherosclerosis. No abdominopelvic adenopathy. Reproductive: Hysterectomy.  No adnexal mass. Other: No significant free fluid. No evidence of omental or peritoneal disease. Musculoskeletal: Soft tissue/muscular metastasis along the left obturator internus muscle measures 3.0 x 1.9 cm on image 109/ series 2 and is new. Heterogeneous marrow density is again identified and indicative of osseous metastasis. A right-sided L2 lesion measures 1.6 cm on image 67/series 2 and is unchanged. IMPRESSION: 1. Large volume saddle type pulmonary embolism, without right heart strain. These results will be called to the ordering clinician or representative by the Radiologist Assistant, and communication documented in the PACS or zVision Dashboard. 2. Progressive disease, as evidenced by development of right supraclavicular nodal mass and soft tissue/muscular metastasis as detailed above. 3. Subtle heterogeneous hepatic density which is nonspecific but could represent early hepatic metastasis. Recommend attention on follow-up versus more definitive characterization with pre and post contrast abdominal MRI. 4. New small right pleural effusion. 5. Right adrenal adenoma. 6. Heterogeneous renal enhancement, suspicious for pyelonephritis. Correlate with urinalysis. Electronically Signed   By: Abigail Miyamoto M.D.   On: 10/14/2016 16:29   Scheduled Meds: . enoxaparin (LOVENOX) injection  70 mg Subcutaneous Q12H  . gabapentin  300 mg Oral TID  . mouth rinse  15 mL Mouth Rinse BID  . potassium chloride  40 mEq Oral Daily  . sodium chloride flush  3 mL Intravenous Q12H  . temazepam  30 mg Oral QHS   Continuous Infusions: .  0.9 % NaCl with KCl 20 mEq / L 100 mL/hr at 11/02/2016 2200    LOS: 1 day   Kerney Elbe, DO [facility] Pager [phone]  If 7PM-7AM, please contact night-coverage [email or web address] Password [facility] 10/23/2016, 7:49 AM

## 2016-10-23 NOTE — Progress Notes (Signed)
Palliative Medicine Team consult was received.   I met briefly today with patient and her son, Geralynn Ochs.  He reports that he believes that he is her HCPOA, but he is not sure.  In either case, they make decisions as a family and want to set up a time that they can all be present to meet.  He did tell me today that his father "always hopes for the best" and has difficulty discussing his wife's illness.  In the interim, he stated that he understands the severity of her situation, but his mother has said that she wants all offered interventions and he wants to honor this.  We will schedule a meeting with patient and her family at the earliest possible time family can meet.  Her son has my card and will call to schedule a meeting (hopefully tomorrow afternoon per his report).  If there are urgent needs or questions please call [phone]. Thank you for consulting out team to assist with this patients care.  NO CHARGE NOTE  Micheline Rough, MD [place] Team [phone]

## 2016-10-23 NOTE — Progress Notes (Signed)
Pt assisted up to [facility]. Pt voided with blood noted in urine. Pt had small maxi pad in panties that was also noted to have moderate amount of bright red blood. MD on the floor and made aware. VSS. Will continue to monitor closely

## 2016-10-23 NOTE — Consult Note (Signed)
Referring Provider:  Dr. Alfredia Ferguson  Primary Care Physician:  [name], PA-C Primary Gastroenterologist:  Dr. Penelope Coop   Reason for Consultation:  Lower GI bleed  HPI: [name] is a 66 y.o. female with past medical history of metastatic small cell lung cancer with evidence of progressive disease based on recent scan. Patient was  diagnosed with brain metastases in March 2018. Patient underwent CT chest abdomen pelvis which was ordered by her oncology for evaluation of abdominal pain and shortness of breath which showed large volume saddle pulmonary embolism. Patient was subsequently admitted to the hospital. Was started on full dose Lovenox. Patient started noticing bright red blood per rectum. GI is consulted for further evaluation.  Patient seen and examined. History obtained with the help of son who helped with interpretation. According to son, she did not had any evidence of blood in the stool until yesterday. She has been having some generalized abdominal discomfort but denied any specific area of pain. Denied history of GI bleed in the past. Patient currently has a shortness of breath requiring oxygen supplementation. Hemoglobin has remained stable. She has not received her morning dose of Lovenox. Last dose was yesterday night.  Had a colonoscopy in 2012 by Dr. Penelope Coop which showed a hyperplastic polyp. Repeat was recommended in 10 years.  Past Medical History:  Diagnosis Date  . Anemia    in the past  . Arthritis   . Depression   . Encounter for antineoplastic chemotherapy 03/30/2016  . Headache   . Hyperlipidemia   . Hypertension   . Insomnia 06/15/2016  . Lung mass 03/12/2016  . PONV (postoperative nausea and vomiting)    after hysterectomy  . SCL CA dx'd 02/2016  . Shortness of breath dyspnea    with exertion    Past Surgical History:  Procedure Laterality Date  . ABDOMINAL HYSTERECTOMY     2009 , due to fobroids, benign path for uterus, tumor and also cervix.   [facility]  S[facility]GERY     lower back  . COLONOSCOPY    . VIDEO BRONCHOSCOPY WITH ENDOBRONCHIAL ULTRASOUND N/A 03/15/2016   Procedure: VIDEO BRONCHOSCOPY WITH ENDOBRONCHIAL ULTRASOUND AND BIOPSY OF LEVEL SEVEN LYMPH NODE;  Surgeon: Grace Isaac, MD;  Location: [facility];  Service: Thoracic;  Laterality: N/A;    Prior to Admission medications   Medication Sig Start Date End Date Taking? Authorizing Provider  acetaminophen (TYLENOL) 500 MG tablet Take 500 mg by mouth every 6 (six) hours as needed for mild pain, moderate pain, fever or headache.   Yes [provider]  benzonatate (TESSALON) 100 MG capsule Take 100-200 mg by mouth 3 (three) times daily as needed for cough.   Yes [provider]  gabapentin (NEURONTIN) 300 MG capsule Take 1 capsule (300 mg total) by mouth 3 (three) times daily. 10/12/16  Yes Hayden Pedro, PA-C  hydrochlorothiazide (HYDRODIURIL) 25 MG tablet TAKE 1 TABLET(25 MG) BY MOUTH DAILY 10/01/16  Yes Timmothy Euler, [place] D, PA-C  LORazepam (ATIVAN) 1 MG tablet Take 0.5-1 tablets (0.5-1 mg total) by mouth every 8 (eight) hours as needed for anxiety or sleep. 09/09/16  Yes Hayden Pedro, PA-C  Oxycodone HCl 10 MG TABS Take 1-2 tablets po q 4-6 hours prn pain Patient taking differently: Take 10-20 mg by mouth every 4 (four) hours as needed (for pain).  10/12/16  Yes Hayden Pedro, PA-C  prochlorperazine (COMPAZINE) 10 MG tablet Take 1 tablet (10 mg total) by mouth every 6 (six) hours  as needed for nausea or vomiting. 07/06/16  Yes Curt Bears, MD  temazepam (RESTORIL) 30 MG capsule TAKE 1 CAPSULE BY MOUTH EVERY DAY AT BEDTIME AS NEEDED Patient taking differently: Take 1 capsule by mouth at bedtime 09/18/16  Yes Curt Bears, MD    Scheduled Meds: . enoxaparin (LOVENOX) injection  70 mg Subcutaneous Q12H  . gabapentin  300 mg Oral TID  . mouth rinse  15 mL Mouth Rinse BID  . [START ON 10/27/2016] pantoprazole  40 mg Intravenous Q12H  . potassium  chloride  40 mEq Oral BID  . sodium chloride flush  3 mL Intravenous Q12H  . temazepam  30 mg Oral QHS   Continuous Infusions: . sodium chloride    . 0.9 % NaCl with KCl 20 mEq / L 50 mL/hr at 10/23/16 0941  . pantoprozole (PROTONIX) infusion 8 mg/hr (10/23/16 1341)   PRN Meds:.LORazepam, morphine injection, ondansetron **OR** ondansetron (ZOFRAN) IV, prochlorperazine  Allergies as of 10/21/2016 - Review Complete 11/11/2016  Allergen Reaction Noted  . Penicillins Swelling, Rash, and Other (See Comments)     Family History  Problem Relation Age of Onset  . Cancer Father 44       Throat Cancer    Social History   Social History  . Marital status: Married    Spouse name: N/A  . Number of children: N/A  . Years of education: N/A   Occupational History  . Not on file.   Social History Main Topics  . Smoking status: Former Smoker    Packs/day: 1.00    Years: 37.00    Types: Cigarettes    Quit date: 03/10/2016  . Smokeless tobacco: Never Used  . Alcohol use No  . Drug use: No  . Sexual activity: No   Other Topics Concern  . Not on file   Social History Narrative  . No narrative on file     Physical Exam: Vital signs: Vitals:   10/23/16 0440 10/23/16 1328  BP: 119/65 120/62  Pulse: (!) 111 (!) 105  Resp: 20 18  Temp: 98.8 F (37.1 C) 98.4 F (36.9 C)     General:   Frail-appearing patient in respiratory distress. Lungs:  Coarse breath sounds bilaterally. Heart:  Regular rate and rhythm; no murmurs, clicks, rubs,  or gallops. Abdomen: Soft, nondistended, no tenderness to palpation. Bowel sounds present Rectal:  Deferred  GI:  Lab Results:  Recent Labs  10/23/2016 1401 11/10/2016 1909 10/23/16 0600 10/23/16 1227  WBC 11.5* 10.1 10.4  --   HGB 10.3* 9.2* 9.1* 9.3*  HCT 31.7* 28.0* 27.5* 28.6*  PLT 86* 79* 70*  --    BMET  Recent Labs  10/18/2016 1401 11/03/2016 1909 10/23/16 0600  NA 133* 129* 131*  K 3.3* 2.5* 3.3*  CL  --  89* 94*  CO2 '28 29  28  '$ GLUCOSE 179* 153* 108*  BUN 15.'7 14 12  '$ CREATININE 0.8 0.58 0.50  CALCIUM 9.9 8.8* 8.8*   LFT  Recent Labs  10/23/16 0600  PROT 5.4*  ALBUMIN 2.3*  AST 123*  ALT 144*  ALKPHOS 339*  BILITOT 1.6*   PT/INR  Recent Labs  11/03/2016 1909 10/23/16 0600  LABPROT 13.8 14.0  INR 1.05 1.07     Studies/Results: Ct Chest W Contrast  Result Date: 10/20/2016 CLINICAL DATA:  Lung cancer diagnosed in 2017. Chemotherapy and radiation therapy complete. Leg swelling fatigue. Hysterectomy. Right-sided small cell primary. EXAM: CT CHEST, ABDOMEN, AND PELVIS WITH CONTRAST TECHNIQUE: Multidetector CT  imaging of the chest, abdomen and pelvis was performed following the standard protocol during bolus administration of intravenous contrast. CONTRAST:  174m ISOVUE-300 IOPAMIDOL (ISOVUE-300) INJECTION 61% COMPARISON:  07/26/2016 and thoracolumbar spine MR 08/28/2016. FINDINGS: CT CHEST FINDINGS Cardiovascular: Aortic atherosclerosis. Borderline cardiomegaly, without pericardial effusion. Lad coronary artery atherosclerosis. Central, saddle type pulmonary embolism, including on image 22/series 2. No evidence of right heart strain. Mediastinum/Nodes: Development of a right supraclavicular nodal mass which measures 4.1 x 5.1 cm on image 9/series 2. No axillary adenopathy. No residual mediastinal adenopathy. No hilar adenopathy. Lungs/Pleura: Trace right pleural fluid is new. Minimal motion degradation throughout. Bilateral paramediastinal upper lobe ground-glass opacity and septal thickening, likely radiation induced. No residual pulmonary nodules are seen. Musculoskeletal: Soft tissue metastasis peripheral to the posterior left tenth and eleventh ribs is new and measures 4.1 x 1.8 cm on image 50/series 2. Similar widespread osseous metastasis. CT ABDOMEN PELVIS FINDINGS Hepatobiliary: Minimal motion continuing into the abdomen. Subtly heterogeneous hepatic density, without well-defined dominant mass.  Hepatomegaly at 21.5 cm craniocaudal. Normal gallbladder, without biliary ductal dilatation. Pancreas: Normal, without mass or ductal dilatation. Spleen: Normal in size, without focal abnormality. Adrenals/Urinary Tract: Minimal left adrenal thickening is unchanged. Right adrenal mass measures 3.3 cm and was consistent with an adenoma on prior PET. Heterogeneous renal enhancement, greater on the left. Most apparent on delayed images. No hydronephrosis. Normal urinary bladder. Stomach/Bowel: Normal stomach, without wall thickening. Normal colon, appendix, and terminal ileum. Normal small bowel. Vascular/Lymphatic: Advanced aortic and branch vessel atherosclerosis. No abdominopelvic adenopathy. Reproductive: Hysterectomy.  No adnexal mass. Other: No significant free fluid. No evidence of omental or peritoneal disease. Musculoskeletal: Soft tissue/muscular metastasis along the left obturator internus muscle measures 3.0 x 1.9 cm on image 109/ series 2 and is new. Heterogeneous marrow density is again identified and indicative of osseous metastasis. A right-sided L2 lesion measures 1.6 cm on image 67/series 2 and is unchanged. IMPRESSION: 1. Large volume saddle type pulmonary embolism, without right heart strain. These results will be called to the ordering clinician or representative by the Radiologist Assistant, and communication documented in the PACS or zVision Dashboard. 2. Progressive disease, as evidenced by development of right supraclavicular nodal mass and soft tissue/muscular metastasis as detailed above. 3. Subtle heterogeneous hepatic density which is nonspecific but could represent early hepatic metastasis. Recommend attention on follow-up versus more definitive characterization with pre and post contrast abdominal MRI. 4. New small right pleural effusion. 5. Right adrenal adenoma. 6. Heterogeneous renal enhancement, suspicious for pyelonephritis. Correlate with urinalysis. Electronically Signed   By: KAbigail MiyamotoM.D.   On: 10/18/2016 16:29   Ct Abdomen Pelvis W Contrast  Result Date: 10/28/2016 CLINICAL DATA:  Lung cancer diagnosed in 2017. Chemotherapy and radiation therapy complete. Leg swelling fatigue. Hysterectomy. Right-sided small cell primary. EXAM: CT CHEST, ABDOMEN, AND PELVIS WITH CONTRAST TECHNIQUE: Multidetector CT imaging of the chest, abdomen and pelvis was performed following the standard protocol during bolus administration of intravenous contrast. CONTRAST:  1053mISOVUE-300 IOPAMIDOL (ISOVUE-300) INJECTION 61% COMPARISON:  07/26/2016 and thoracolumbar spine MR 08/28/2016. FINDINGS: CT CHEST FINDINGS Cardiovascular: Aortic atherosclerosis. Borderline cardiomegaly, without pericardial effusion. Lad coronary artery atherosclerosis. Central, saddle type pulmonary embolism, including on image 22/series 2. No evidence of right heart strain. Mediastinum/Nodes: Development of a right supraclavicular nodal mass which measures 4.1 x 5.1 cm on image 9/series 2. No axillary adenopathy. No residual mediastinal adenopathy. No hilar adenopathy. Lungs/Pleura: Trace right pleural fluid is new. Minimal motion degradation throughout. Bilateral paramediastinal upper  lobe ground-glass opacity and septal thickening, likely radiation induced. No residual pulmonary nodules are seen. Musculoskeletal: Soft tissue metastasis peripheral to the posterior left tenth and eleventh ribs is new and measures 4.1 x 1.8 cm on image 50/series 2. Similar widespread osseous metastasis. CT ABDOMEN PELVIS FINDINGS Hepatobiliary: Minimal motion continuing into the abdomen. Subtly heterogeneous hepatic density, without well-defined dominant mass. Hepatomegaly at 21.5 cm craniocaudal. Normal gallbladder, without biliary ductal dilatation. Pancreas: Normal, without mass or ductal dilatation. Spleen: Normal in size, without focal abnormality. Adrenals/Urinary Tract: Minimal left adrenal thickening is unchanged. Right adrenal mass measures  3.3 cm and was consistent with an adenoma on prior PET. Heterogeneous renal enhancement, greater on the left. Most apparent on delayed images. No hydronephrosis. Normal urinary bladder. Stomach/Bowel: Normal stomach, without wall thickening. Normal colon, appendix, and terminal ileum. Normal small bowel. Vascular/Lymphatic: Advanced aortic and branch vessel atherosclerosis. No abdominopelvic adenopathy. Reproductive: Hysterectomy.  No adnexal mass. Other: No significant free fluid. No evidence of omental or peritoneal disease. Musculoskeletal: Soft tissue/muscular metastasis along the left obturator internus muscle measures 3.0 x 1.9 cm on image 109/ series 2 and is new. Heterogeneous marrow density is again identified and indicative of osseous metastasis. A right-sided L2 lesion measures 1.6 cm on image 67/series 2 and is unchanged. IMPRESSION: 1. Large volume saddle type pulmonary embolism, without right heart strain. These results will be called to the ordering clinician or representative by the Radiologist Assistant, and communication documented in the PACS or zVision Dashboard. 2. Progressive disease, as evidenced by development of right supraclavicular nodal mass and soft tissue/muscular metastasis as detailed above. 3. Subtle heterogeneous hepatic density which is nonspecific but could represent early hepatic metastasis. Recommend attention on follow-up versus more definitive characterization with pre and post contrast abdominal MRI. 4. New small right pleural effusion. 5. Right adrenal adenoma. 6. Heterogeneous renal enhancement, suspicious for pyelonephritis. Correlate with urinalysis. Electronically Signed   By: Abigail Miyamoto M.D.   On: 11/09/2016 16:29    Impression/Plan: - Painless hematochezia in setting of newly diagnosed pulmonary embolism and use of Full-dose Lovenox. - Large saddle pulmonary embolism. 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 -  Abnormal LFTs. CT scan showed subtlehepatic density  Which may represent early hepatic metastasis.  Recommendations ------------------------- - Long discussion with patient's son. Etiology of lower GI bleed discussed including diverticular bleed, bleeding from angiectasia versus hemorrhoidal bleed. Recurrent and obscure nature of GI bleed also discussed. Patient and family wants full medical care at this time. They  would also like to know the source of bleeding. Risk of invasive procedure/ colonoscopy also discuss which includes but not limited to introducing infection, bleeding and sometimes perforation. Risk of complication from sedation given her large pulmonary embolism and possible aspiration pneumonia also discuss with the patient and son. I will order bleeding scan for now. If bleeding scan shows active bleeding, she will need interventional radiology consult and  angiography for embolization. This was also discussed with the son. Case discussed with admitting team. Palliative care has been consulted by admitting team. Monitor hemoglobin. Transfuse if hemoglobin less than 8. GI will follow.  Approximately 50  minutes spent in care of this patient with more than 50 % of the time spent on consultation and coordination of care. Old records reviewed and summarized.    LOS: 1 day   Otis Brace  MD, FACP 10/23/2016, 1:52 PM  Pager [phone] If no answer or after 5 PM call  [phone] 

## 2016-10-23 NOTE — Progress Notes (Signed)
Pt up to [facility]. Pt had BM and also voided. Moderate soft stool noted to have streaks of bright red blood and urine noted to have blood. MD and Pharmacy made aware. AM Dose of Lovenox held per MD orders.

## 2016-10-23 NOTE — Progress Notes (Signed)
  Echocardiogram 2D Echocardiogram has been performed.  [name] 10/23/2016, 9:55 AM

## 2016-10-23 NOTE — Progress Notes (Signed)
HEMATOLOGY-ONCOLOGY PROGRESS NOTE  SUBJECTIVE:Patient of Dr. Julien Nordmann admitted with saddle pulmonary embolism. Complaining of left shoulder pain and overall profound decline in performance status. On review of prior history patient,patient was diagnosed with extended stage small cell lung cancer involving the right upper lobe with massive mediastinal lymphadenopathy and bone metastases in October 2017. She received radiation followed by chemotherapy. She received carboplatin and etoposide completed 07/06/2016. She had a rough time with the last 2 cycles. Subsequently she had a very good response to treatment. Unfortunately she had developed brain metastases and required whole brain radiation. Since that time she had declined performance status wise. She also received radiation to the spine. She was complaining of shortness of breath exertion and underwent CT chest abdomen pelvis as part of routine staging and was diagnosed with saddle pulmonary embolism. Because of this she went to the emergency room from there she was admitted to the hospital and was started on Lovenox. Patient does not speak [place] and I communicated using her daughter.  OBJECTIVE: REVIEW OF SYSTEMS:   Constitutional: Denies fevers, chills or abnormal weight loss Eyes: Denies blurriness of vision Ears, nose, mouth, throat, and face: Denies mucositis or sore throat Respiratory: shortness of breath to exertion Cardiovascular: chest discomfort and palpitations Gastrointestinal:  Denies nausea, heartburn or change in bowel habits Skin: Denies abnormal skin rashes Neurological:Denies numbness, tingling or new weaknesses Behavioral/Psych: Mood is stable, no new changes  Extremities: No lower extremity edema All other systems were reviewed with the patient and are negative.  I have reviewed the past medical history, past surgical history, social history and family history with the patient and they are unchanged from previous  note.   PHYSICAL EXAMINATION: ECOG PERFORMANCE STATUS: 3 - Symptomatic, >50% confined to bed  Vitals:   10/15/2016 2110 10/23/16 0440  BP: 121/71 119/65  Pulse: (!) 108 (!) 111  Resp: (!) 28 20  Temp: 98.7 F (37.1 C) 98.8 F (37.1 C)   Filed Weights   11/08/2016 1751 10/23/2016 2110  Weight: 148 lb (67.1 kg) 154 lb 12.2 oz (70.2 kg)    GENERAL:alert, no distress and comfortable SKIN: skin color, texture, turgor are normal, no rashes or significant lesions EYES: normal, Conjunctiva are pink and non-injected, sclera clear OROPHARYNX:no exudate, no erythema and lips, buccal mucosa, and tongue normal  LUNGS: clear to auscultation and percussion with normal breathing effort HEART: regular rate & rhythm and no murmurs and no lower extremity edema ABDOMEN:abdomen soft, non-tender and normal bowel sounds Musculoskeletal:no cyanosis of digits and no clubbing  NEURO: alert & oriented x 3 with fluent speech, no focal motor/sensory deficits  LABORATORY DATA:  I have reviewed the data as listed CMP Latest Ref Rng & Units 10/23/2016 11/03/2016 11/07/2016  Glucose 65 - 99 mg/dL 108(H) 153(H) 179(H)  BUN 6 - 20 mg/dL 12 14 15.7  Creatinine 0.44 - 1.00 mg/dL 0.50 0.58 0.8  Sodium 135 - 145 mmol/L 131(L) 129(L) 133(L)  Potassium 3.5 - 5.1 mmol/L 3.3(L) 2.5(LL) 3.3(L)  Chloride 101 - 111 mmol/L 94(L) 89(L) -  CO2 22 - 32 mmol/L '28 29 28  '$ Calcium 8.9 - 10.3 mg/dL 8.8(L) 8.8(L) 9.9  Total Protein 6.5 - 8.1 g/dL 5.4(L) 5.9(L) 6.7  Total Bilirubin 0.3 - 1.2 mg/dL 1.6(H) 1.4(H) 2.18(H)  Alkaline Phos 38 - 126 U/L 339(H) 359(H) 436(H)  AST 15 - 41 U/L 123(H) 144(H) 192(HH)  ALT 14 - 54 U/L 144(H) 158(H) 190(H)    Lab Results  Component Value Date   WBC  10.4 10/23/2016   HGB 9.1 (L) 10/23/2016   HCT 27.5 (L) 10/23/2016   MCV 92.3 10/23/2016   PLT 70 (L) 10/23/2016   NEUTROABS 8.5 (H) 10/30/2016    ASSESSMENT AND PLAN: 1. Metastatic small cell lung cancer with evidence of progression of  disease involving the right supraclavicular area as well as the left chest wall 10th and 11th ribs. There is a palpable mass at the left 10th and 11th ribs. It is however not painful.I discussed with the daughter that relapsed small cell lung cancer has a very poor prognosis. Systemic chemotherapy can be very difficult to tolerate without great results. Palliative radiation could be considered if any of these lesions were painful. I recommended that the patient meet with palliative and hospice care to discuss about hospice care options. Currently the patient's daughter and their family have being care of her at home.  2. Saddle pulmonary embolism: Echocardiogram was just completed. I recommended switching her from Lovenox to Xarelto upon discharge. 3. Markedly elevated liver function tests: Bilirubin has slightly come down. Alkaline phosphatase might have been elevated from the bone metastases. AST ALT could be abnormal due to metastatic lung cancer. These results were normal in February 2018. 4. Thrombocytopenia: Related to prior treatments as well as radiation therapy. In February her platelets are 136. There have not been any CBCs since that time.   I spent 45 minutes discussing this with the patient's daughter and she is in agreement with our plan. I thank the hospitalists for taking care of her.

## 2016-10-24 ENCOUNTER — Inpatient Hospital Stay (HOSPITAL_COMMUNITY): Payer: No Typology Code available for payment source

## 2016-10-24 DIAGNOSIS — Z515 Encounter for palliative care: Secondary | ICD-10-CM

## 2016-10-24 DIAGNOSIS — Z7189 Other specified counseling: Secondary | ICD-10-CM

## 2016-10-24 DIAGNOSIS — I2692 Saddle embolus of pulmonary artery without acute cor pulmonale: Secondary | ICD-10-CM

## 2016-10-24 LAB — COMPREHENSIVE METABOLIC PANEL
ALBUMIN: 2.3 g/dL — AB (ref 3.5–5.0)
ALK PHOS: 360 U/L — AB (ref 38–126)
ALT: 160 U/L — AB (ref 14–54)
ANION GAP: 10 (ref 5–15)
AST: 136 U/L — ABNORMAL HIGH (ref 15–41)
BUN: 15 mg/dL (ref 6–20)
CALCIUM: 9.2 mg/dL (ref 8.9–10.3)
CHLORIDE: 99 mmol/L — AB (ref 101–111)
CO2: 24 mmol/L (ref 22–32)
Creatinine, Ser: 0.52 mg/dL (ref 0.44–1.00)
GFR calc Af Amer: >60 mL/min (ref >60)
GFR calc non Af Amer: >60 mL/min (ref >60)
GLUCOSE: 95 mg/dL (ref 65–99)
Potassium: 4.9 mmol/L (ref 3.5–5.1)
Sodium: 133 mmol/L — ABNORMAL LOW (ref 135–145)
Total Bilirubin: 3.4 mg/dL — ABNORMAL HIGH (ref 0.3–1.2)
Total Protein: 5.5 g/dL — ABNORMAL LOW (ref 6.5–8.1)

## 2016-10-24 LAB — CBC WITH DIFFERENTIAL/PLATELET
Basophils Absolute: 0 10*3/uL (ref 0.0–0.1)
Basophils Relative: 0 %
EOS PCT: 0 %
Eosinophils Absolute: 0 10*3/uL (ref 0.0–0.7)
HEMATOCRIT: 28.7 % — AB (ref 36.0–46.0)
HEMOGLOBIN: 9.2 g/dL — AB (ref 12.0–15.0)
LYMPHS PCT: 8 %
Lymphs Abs: 0.8 10*3/uL (ref 0.7–4.0)
MCH: 30.5 pg (ref 26.0–34.0)
MCHC: 32.1 g/dL (ref 30.0–36.0)
MCV: 95 fL (ref 78.0–100.0)
Monocytes Absolute: 0.6 10*3/uL (ref 0.1–1.0)
Monocytes Relative: 6 %
NEUTROS ABS: 9.4 10*3/uL — AB (ref 1.7–7.7)
NEUTROS PCT: 87 %
Platelets: 72 10*3/uL — ABNORMAL LOW (ref 150–400)
RBC: 3.02 MIL/uL — AB (ref 3.87–5.11)
RDW: 19.8 % — ABNORMAL HIGH (ref 11.5–15.5)
WBC: 10.9 10*3/uL — ABNORMAL HIGH (ref 4.0–10.5)

## 2016-10-24 LAB — CBC
HCT: 27.5 % — ABNORMAL LOW (ref 36.0–46.0)
Hemoglobin: 8.8 g/dL — ABNORMAL LOW (ref 12.0–15.0)
MCH: 30.3 pg (ref 26.0–34.0)
MCHC: 32 g/dL (ref 30.0–36.0)
MCV: 94.8 fL (ref 78.0–100.0)
PLATELETS: 72 10*3/uL — AB (ref 150–400)
RBC: 2.9 MIL/uL — ABNORMAL LOW (ref 3.87–5.11)
RDW: 20 % — AB (ref 11.5–15.5)
WBC: 9.6 10*3/uL (ref 4.0–10.5)

## 2016-10-24 LAB — HEMOGLOBIN AND HEMATOCRIT, BLOOD
HCT: 28.7 % — ABNORMAL LOW (ref 36.0–46.0)
HEMATOCRIT: 29.5 % — AB (ref 36.0–46.0)
HEMOGLOBIN: 9.3 g/dL — AB (ref 12.0–15.0)
HEMOGLOBIN: 9.4 g/dL — AB (ref 12.0–15.0)

## 2016-10-24 LAB — HEPARIN LEVEL (UNFRACTIONATED): Heparin Unfractionated: 0.11 IU/mL — ABNORMAL LOW (ref 0.30–0.70)

## 2016-10-24 LAB — ABO/RH: ABO/RH(D): AB NEG

## 2016-10-24 LAB — BILIRUBIN, DIRECT: BILIRUBIN DIRECT: 2 mg/dL — AB (ref 0.1–0.5)

## 2016-10-24 LAB — MAGNESIUM: Magnesium: 1.8 mg/dL (ref 1.7–2.4)

## 2016-10-24 LAB — PHOSPHORUS: Phosphorus: 3.6 mg/dL (ref 2.5–4.6)

## 2016-10-24 MED ORDER — HEPARIN (PORCINE) IN NACL 100-0.45 UNIT/ML-% IJ SOLN
1050.0000 [IU]/h | INTRAMUSCULAR | Status: DC
  Administered 2016-10-24: 800 [IU]/h via INTRAVENOUS
  Filled 2016-10-24: qty 250

## 2016-10-24 MED ORDER — PANTOPRAZOLE SODIUM 40 MG IV SOLR
40.0000 mg | INTRAVENOUS | Status: DC
  Administered 2016-10-24 – 2016-10-25 (×2): 40 mg via INTRAVENOUS
  Filled 2016-10-24: qty 40

## 2016-10-24 MED ORDER — SODIUM CHLORIDE 0.9 % IV SOLN
INTRAVENOUS | Status: DC
  Administered 2016-10-24 – 2016-10-26 (×4): via INTRAVENOUS

## 2016-10-24 NOTE — Progress Notes (Signed)
ANTICOAGULATION CONSULT NOTE - Initial Consult  Pharmacy Consult for heparin Indication: pulmonary embolus  Allergies  Allergen Reactions  . Penicillins Swelling, Rash and Other (See Comments)    Reaction:  Facial swelling and high BP Has patient had a PCN reaction causing immediate rash, facial/tongue/throat swelling, SOB or lightheadedness with hypotension: Yes Has patient had a PCN reaction causing severe rash involving mucus membranes or skin necrosis: No Has patient had a PCN reaction that required hospitalization No Has patient had a PCN reaction occurring within the last 10 years: No If all of the above answers are "NO", then may proceed with Cephalosporin use.    Patient Measurements: Height: '5\' 5"'$  (165.1 cm) Weight: 154 lb 12.2 oz (70.2 kg) IBW/kg (Calculated) : 57 Heparin Dosing Weight: 70.2  Vital Signs: Temp: 97.9 F (36.6 C) (05/13 1458) Temp Source: Oral (05/13 1458) BP: 111/61 (05/13 1458) Pulse Rate: 102 (05/13 1458)  Labs:  Recent Labs  11/08/2016 1909 10/23/16 0600  10/23/16 2357 10/24/16 0509 10/24/16 1122  HGB 9.2* 9.1*  < > 9.3* 9.2* 9.4*  HCT 28.0* 27.5*  < > 29.5* 28.7* 28.7*  PLT 79* 70*  --   --  72*  --   APTT  --  37*  --   --   --   --   LABPROT 13.8 14.0  --   --   --   --   INR 1.05 1.07  --   --   --   --   CREATININE 0.58 0.50  --   --  0.52  --   < > = values in this interval not displayed.  Estimated Creatinine Clearance: 69.9 mL/min (by C-G formula based on SCr of 0.52 mg/dL).   Medical History: Past Medical History:  Diagnosis Date  . Anemia    in the past  . Arthritis   . Depression   . Encounter for antineoplastic chemotherapy 03/30/2016  . Headache   . Hyperlipidemia   . Hypertension   . Insomnia 06/15/2016  . Lung mass 03/12/2016  . PONV (postoperative nausea and vomiting)    after hysterectomy  . SCL CA dx'd 02/2016  . Shortness of breath dyspnea    with exertion    Assessment: - Painless hematochezia in setting  of newly diagnosed pulmonary embolism/DVT  and use of Full-dose Lovenox. - Large saddle pulmonary embolism. -  small cell lung cancer with progression of disease based on recent CT scans -  Last dose of enoxaparin 5/11 @ 2129 - pharmacy consulted to dose heparin for PE - H/H low but stable - Plts 72   Goal of Therapy:  Heparin level 0.3-0.7 units/ml Monitor platelets by anticoagulation protocol   Plan:  No bolus due to bleeding Start heparin drip at 800 units/hr Heparin level in 8 hours    Dolly Rias RPh 10/24/2016, 4:41 PM Pager [phone]

## 2016-10-24 NOTE — Progress Notes (Signed)
Patient voided only approximately 10 ml of urine.  Bladder scan was performed and yielded 362 ml of urine.  PCP on call was notified.

## 2016-10-24 NOTE — Progress Notes (Signed)
[facility] Progress Note  [name] 66 y.o. Mar 21, 1951  CC:  Rectal bleeding   Subjective: Patient had 1 episode of small amount of blood in the stool around 3 AM. No further episodes. Hemoglobin remained stable. Multiple family members present during the conversation.    Objective: Vital signs in last 24 hours: Vitals:   10/24/16 0608 10/24/16 1458  BP: 114/69 111/61  Pulse: (!) 101 (!) 102  Resp: 20 20  Temp: 98.3 F (36.8 C) 97.9 F (36.6 C)     Lab Results:  Recent Labs  10/23/16 0600 10/24/16 0509  NA 131* 133*  K 3.3* 4.9  CL 94* 99*  CO2 28 24  GLUCOSE 108* 95  BUN 12 15  CREATININE 0.50 0.52  CALCIUM 8.8* 9.2  MG 1.7 1.8  PHOS 2.9 3.6    Recent Labs  10/23/16 0600 10/24/16 0509  AST 123* 136*  ALT 144* 160*  ALKPHOS 339* 360*  BILITOT 1.6* 3.4*  PROT 5.4* 5.5*  ALBUMIN 2.3* 2.3*    Recent Labs  10/24/2016 1909 10/23/16 0600  10/24/16 0509 10/24/16 1122  WBC 10.1 10.4  --  10.9*  --   NEUTROABS 8.5*  --   --  9.4*  --   HGB 9.2* 9.1*  < > 9.2* 9.4*  HCT 28.0* 27.5*  < > 28.7* 28.7*  MCV 91.8 92.3  --  95.0  --   PLT 79* 70*  --  72*  --   < > = values in this interval not displayed.  Recent Labs  10/31/2016 1909 10/23/16 0600  LABPROT 13.8 14.0  INR 1.05 1.07   Had a colonoscopy in 2012 by Dr. Penelope Coop which showed a hyperplastic polyp.   Assessment/Plan: - Painless hematochezia in setting of newly diagnosed pulmonary embolism/DVT  and use of Full-dose Lovenox. - Large saddle pulmonary embolism. -  small cell lung cancer with progression of disease based on recent CT scans. - Abnormal LFTs. CT scan showed subtlehepatic density  Which may represent early hepatic metastasis.  Recommendations ------------------------- - Long discussion with patient's son and other family members again today. As of now they are waiting for Dr. Inda Merlin  who is patient's primary oncology to discuss patient's prognosis. Palliative care  attending was also present during my conversation. Family wants continue the care with all the option at this time. Discussed heparin drip which could be turned off and shorter lasting then full dose Lovenox. Family is in agreement to start heparin drip. They were Informed that eventually patient will need to switch to Lovenox.  - Recommend GI bleeding scan if she starts having bleeding again.If bleeding scan shows active bleeding, she will need interventional radiology consult and  angiography for embolization. This was also discussed with the son.  -  I think patient is at high-risk for complications for invasive workup such as colonoscopy given untreated underlying pulmonary embolism as well as other medical comorbidities. . Case discussed with attending Dr. Alfredia Ferguson regarding starting heparin drip. - Monitor H&H. GI will follow.  Approximately 25  minutes spent in care of this patient with more than 50 % of the time spent on consultation and coordination of care. Case was discussed with the primary attending Dr. Alfredia Ferguson.    Otis Brace MD, FACP 10/24/2016, 4:17 PM  Pager [phone]  If no answer or after 5 PM call [phone]

## 2016-10-24 NOTE — Progress Notes (Signed)
HEMATOLOGY-ONCOLOGY PROGRESS NOTE  SUBJECTIVE:patient developed GI bleeding to Lovenox therapy. Lovenox was discontinued temporarily and gastroenterology was consulted. Patient is evaluated to undergo a nuclear medicine scan to look for the site of bleeding. Patient also met with palliative care yesterday. They are awaiting Dr. Julien Nordmann to discuss what options she may have. She reports mild discomfort in the left lower quadrant. There is also slight swelling of the left lower extremity. Ultrasound Dopplers are pending. Patient's daughter was at bedside to translate for [place].  OBJECTIVE: REVIEW OF SYSTEMS:   Constitutional: Denies fevers, chills or abnormal weight loss Eyes: Denies blurriness of vision Ears, nose, mouth, throat, and face: Denies mucositis or sore throat Respiratory: shortness of breath, uses oxygen Cardiovascular: Denies palpitation, chest discomfort Gastrointestinal:  Slight abdominal pain in the left lower quadrant, this morning she may have had a slight amount of blood in the stool but not a significant amount. Skin: Denies abnormal skin rashes Lymphatics: Denies new lymphadenopathy or easy bruising Neurological:Denies numbness, tingling or new weaknesses Behavioral/Psych: Mood is stable, no new changes  Extremities: 1+ left lower extremity edema All other systems were reviewed with the patient and are negative.  I have reviewed the past medical history, past surgical history, social history and family history with the patient and they are unchanged from previous note.   PHYSICAL EXAMINATION: ECOG PERFORMANCE STATUS: 3 - Symptomatic, >50% confined to bed  Vitals:   10/23/16 2105 10/24/16 0608  BP: 114/67 114/69  Pulse: (!) 107 (!) 101  Resp: 20 20  Temp: 99.4 F (37.4 C) 98.3 F (36.8 C)   Filed Weights   10/19/2016 1751 11/11/2016 2110  Weight: 148 lb (67.1 kg) 154 lb 12.2 oz (70.2 kg)    GENERAL:alert, no distress and comfortable SKIN: skin color, texture,  turgor are normal, no rashes or significant lesions EYES: normal, Conjunctiva are pink and non-injected, sclera clear OROPHARYNX:no exudate, no erythema and lips, buccal mucosa, and tongue normal  NECK: supple, thyroid normal size, non-tender, without nodularity LYMPH:  no palpable lymphadenopathy in the cervical, axillary or inguinal LUNGS: clear to auscultation and percussion with normal breathing effort HEART: regular rate & rhythm and no murmurs and no lower extremity edema ABDOMEN:Slight tenderness in the left lower quadrant Musculoskeletal:no cyanosis of digits and no clubbing  NEURO: alert & oriented x 3 with fluent speech, no focal motor/sensory deficits  LABORATORY DATA:  I have reviewed the data as listed CMP Latest Ref Rng & Units 10/24/2016 10/23/2016 11/10/2016  Glucose 65 - 99 mg/dL 95 108(H) 153(H)  BUN 6 - 20 mg/dL _0 Creatinine 0.44 - 1.00 mg/dL 0.52 0.50 0.58  Sodium 135 - 145 mmol/L 133(L) 131(L) 129(L)  Potassium 3.5 - 5.1 mmol/L 4.9 3.3(L) 2.5(LL)  Chloride 101 - 111 mmol/L 99(L) 94(L) 89(L)  CO2 22 - 32 mmol/L _1 Calcium 8.9 - 10.3 mg/dL 9.2 8.8(L) 8.8(L)  Total Protein 6.5 - 8.1 g/dL 5.5(L) 5.4(L) 5.9(L)  Total Bilirubin 0.3 - 1.2 mg/dL 3.4(H) 1.6(H) 1.4(H)  Alkaline Phos 38 - 126 U/L 360(H) 339(H) 359(H)  AST 15 - 41 U/L 136(H) 123(H) 144(H)  ALT 14 - 54 U/L 160(H) 144(H) 158(H)    Lab Results  Component Value Date   WBC 10.9 (H) 10/24/2016   HGB 9.2 (L) 10/24/2016   HCT 28.7 (L) 10/24/2016   MCV 95.0 10/24/2016   PLT 72 (L) 10/24/2016   NEUTROABS 9.4 (H) 10/24/2016    ASSESSMENT AND PLAN: 1. Metastatic small cell  lung cancer with recent evidence of progression of disease on the scans. Yesterday discussed with him about palliative care/hospice options. Patient's family is keen to hear what Dr. Worthy Flank opinion is regarding her treatment options. She certainly tolerated the first round of chemotherapy fairly well according to the patients  family. I will ask Dr. Julien Nordmann to give the patient's son by phone call to set up the time for a family meeting.  2. Saddle pulmonary embolism: Unfortunately with the anticoagulation she developed GI bleeding. Because of this Lovenox was discontinued. She may be undergoing a nuclear medicine scan to look for the cause of bleeding tomorrow. Depending on that result, she may be started on heparin drip with careful monitoring for recurrent bleed. Without anticoagulation this could be a serious detriment to her life expectancy given the nature of the blood clot.  3. Markedly elevated liver function tests: It appears that the total bilirubin has increased significantly. I will order split bilirubin. This could be related to metastatic lung cancer.   4. Brain metastases: Previously radiated 5. Thrombocytopenia: Platelets are 72

## 2016-10-24 NOTE — Progress Notes (Signed)
Informed MD of Bil lower ext venous duplex report. SRP, RN

## 2016-10-24 NOTE — Progress Notes (Signed)
Consultation Note Date: 10/24/2016   Patient Name: [name]  DOB: 1950-12-23  MRN: [id number]  Age / Sex: 66 y.o., female  PCP: [name], PA-C Referring Physician: Kerney Elbe, DO  Reason for Consultation: Establishing goals of care  HPI/Patient Profile: 66 y.o. female  with past medical history of extensive small cell lung cancer discovered in October of this year. She is status post radiation as well as chemotherapy. She had a new finding of brain metastases in March of this year and also had whole brain radiation as well as spinal radiation. She had a CT scan on 10/18/2016 and was subsequently admitted after discovery of saddle pulmonary embolus with noted progression of her cancer.  Her course was further complicated by GI bleed following initiation of Lovenox therapy for PE.  Palliative consulted for goals of care.   Clinical Assessment and Goals of Care: I met today with patient, her sons, her daughter, and her husband. We discussed clinical course as well as wishes moving forward in regard to advanced directives.  Concepts specific to code status, care plan during hospitalization, and overall prognosis with continued functional decline and complications from her cancer discussed.  Values and goals of care important to patient and family were attempted to be elicited.    We discussed difference between a aggressive medical intervention path and a palliative, comfort focused care path.    Questions and concerns addressed.   PMT will continue to support holistically.  SUMMARY OF RECOMMENDATIONS   - We discussed that in light of multiple chronic medical problems that have worsened with this acute problem, care should be focused on interventions that are likely to allow her to achieve goal of spending time with family. I discussed with patient and family regarding heroic interventions at  the end-of-life and they agree this would not be in line with prior expressed wishes for a natural death or be likely to lead to getting well enough to leave the hospital. They were in agreement with changing CODE STATUS to DO NOT RESUSCITATE. - Discussed risk vs benefit of heparin drip with known saddle PE, DVT, and recent GI bleed.  Family in agreement with initiation of heparin infusion.  If she starts to bleed, we'll plan to DC heparin infusion. - Family and patient wanted to discuss with Dr. Earlie Server about care plan moving forward prior to making any further decisions about her care.  We did talk a little bit today about hospice and they are open this conversation if she is not likely to benefit from further disease modifying therapy.  Code Status/Advance Care Planning:  DNR  Palliative Prophylaxis:   Frequent Pain Assessment  Psycho-social/Spiritual:   Desire for further Chaplaincy support:no  Additional Recommendations: Education on Hospice  Prognosis:   < 6 months and she should qualify for hospice support if so desired any point in the future  Discharge Planning: To Be Determined. Family was inquiring about hospice today.      Primary Diagnoses: Present on Admission: . Acute saddle  pulmonary embolism without acute cor pulmonale ([place]) . Essential hypertension . Small cell carcinoma of lung, right ([place]) . Hyponatremia . Hypokalemia . Normocytic anemia . Transaminitis . Thrombocytopenia ([place])   I have reviewed the medical record, interviewed the patient and family, and examined the patient. The following aspects are pertinent.  Past Medical History:  Diagnosis Date  . Anemia    in the past  . Arthritis   . Depression   . Encounter for antineoplastic chemotherapy 03/30/2016  . Headache   . Hyperlipidemia   . Hypertension   . Insomnia 06/15/2016  . Lung mass 03/12/2016  . PONV (postoperative nausea and vomiting)    after hysterectomy  . SCL CA dx'd 02/2016  .  Shortness of breath dyspnea    with exertion   Social History   Social History  . Marital status: Married    Spouse name: N/A  . Number of children: N/A  . Years of education: N/A   Social History Main Topics  . Smoking status: Former Smoker    Packs/day: 1.00    Years: 37.00    Types: Cigarettes    Quit date: 03/10/2016  . Smokeless tobacco: Never Used  . Alcohol use No  . Drug use: No  . Sexual activity: No   Other Topics Concern  . None   Social History Narrative  . None   Family History  Problem Relation Age of Onset  . Cancer Father 54       Throat Cancer   Scheduled Meds: . gabapentin  300 mg Oral TID  . mouth rinse  15 mL Mouth Rinse BID  . pantoprazole (PROTONIX) IV  40 mg Intravenous Q24H  . sodium chloride flush  3 mL Intravenous Q12H  . temazepam  30 mg Oral QHS   Continuous Infusions: . sodium chloride 75 mL/hr at 10/24/16 0929  . heparin     PRN Meds:.LORazepam, morphine injection, ondansetron **OR** ondansetron (ZOFRAN) IV, prochlorperazine Medications Prior to Admission:  Prior to Admission medications   Medication Sig Start Date End Date Taking? Authorizing Provider  acetaminophen (TYLENOL) 500 MG tablet Take 500 mg by mouth every 6 (six) hours as needed for mild pain, moderate pain, fever or headache.   Yes [provider]  benzonatate (TESSALON) 100 MG capsule Take 100-200 mg by mouth 3 (three) times daily as needed for cough.   Yes [provider]  gabapentin (NEURONTIN) 300 MG capsule Take 1 capsule (300 mg total) by mouth 3 (three) times daily. 10/12/16  Yes Hayden Pedro, PA-C  hydrochlorothiazide (HYDRODIURIL) 25 MG tablet TAKE 1 TABLET(25 MG) BY MOUTH DAILY 10/01/16  Yes Timmothy Euler, [place] D, PA-C  LORazepam (ATIVAN) 1 MG tablet Take 0.5-1 tablets (0.5-1 mg total) by mouth every 8 (eight) hours as needed for anxiety or sleep. 09/09/16  Yes Hayden Pedro, PA-C  Oxycodone HCl 10 MG TABS Take 1-2 tablets po q 4-6  hours prn pain Patient taking differently: Take 10-20 mg by mouth every 4 (four) hours as needed (for pain).  10/12/16  Yes Hayden Pedro, PA-C  prochlorperazine (COMPAZINE) 10 MG tablet Take 1 tablet (10 mg total) by mouth every 6 (six) hours as needed for nausea or vomiting. 07/06/16  Yes Curt Bears, MD  temazepam (RESTORIL) 30 MG capsule TAKE 1 CAPSULE BY MOUTH EVERY DAY AT BEDTIME AS NEEDED Patient taking differently: Take 1 capsule by mouth at bedtime 09/18/16  Yes Curt Bears, MD   Allergies  Allergen Reactions  . Penicillins  Swelling, Rash and Other (See Comments)    Reaction:  Facial swelling and high BP Has patient had a PCN reaction causing immediate rash, facial/tongue/throat swelling, SOB or lightheadedness with hypotension: Yes Has patient had a PCN reaction causing severe rash involving mucus membranes or skin necrosis: No Has patient had a PCN reaction that required hospitalization No Has patient had a PCN reaction occurring within the last 10 years: No If all of the above answers are "NO", then may proceed with Cephalosporin use.   Review of Systems  Constitutional: Positive for activity change.  Respiratory: Positive for shortness of breath.   Gastrointestinal: Positive for blood in stool.  Neurological: Positive for weakness.    Physical Exam  General: Alert, awake, in no acute distress. Chronically ill appearing HEENT: No bruits, no goiter, no JVD Heart: Regular rate and rhythm. No murmur appreciated. Lungs: Diminished air movement, clear Abdomen: Soft, nontender, nondistended, positive bowel sounds.  Skin: Warm and dry Neuro: Grossly intact, nonfocal.  Vital Signs: BP 111/61 (BP Location: Right Arm)   Pulse (!) 102   Temp 97.9 F (36.6 C) (Oral)   Resp 20   Ht _0  (1.651 m)   Wt 70.2 kg (154 lb 12.2 oz)   SpO2 97%   BMI 25.75 kg/m  Pain Assessment: No/denies pain   Pain Score: 0-No pain   SpO2: SpO2: 97 % O2 Device:SpO2: 97 % O2  Flow Rate: .O2 Flow Rate (L/min): 4 L/min  IO: Intake/output summary:  Intake/Output Summary (Last 24 hours) at 10/24/16 1751 Last data filed at 10/24/16 1500  Gross per 24 hour  Intake          2909.18 ml  Output              600 ml  Net          2309.18 ml    LBM:   Baseline Weight: Weight: 67.1 kg (148 lb) Most recent weight: Weight: 70.2 kg (154 lb 12.2 oz)     Palliative Assessment/Data:   Flowsheet Rows     Most Recent Value  Intake Tab  Referral Department  Hospitalist  Unit at Time of Referral  Med/Surg Unit  Palliative Care Primary Diagnosis  Cancer  Date Notified  10/23/16  Palliative Care Type  New Palliative care  Reason for referral  Clarify Goals of Care  Date of Admission  11/05/2016  Date first seen by Palliative Care  10/23/16  # of days Palliative referral response time  0 Day(s)  # of days IP prior to Palliative referral  1  Clinical Assessment  Palliative Performance Scale Score  30%  Pain Max last 24 hours  3  Pain Min Last 24 hours  0  Psychosocial & Spiritual Assessment  Palliative Care Outcomes  Patient/Family meeting held?  Yes  Who was at the meeting?  Patient, 2 sons, husband, daughter  Palliative Care Outcomes  Clarified goals of care      Time In: 59 Time Out: 1730 Time Total: 60 Greater than 50%  of this time was spent counseling and coordinating care related to the above assessment and plan.  Signed by: Micheline Rough, MD   Please contact Palliative Medicine Team phone at [phone] for questions and concerns.  For individual provider: See Shea Evans

## 2016-10-24 NOTE — Progress Notes (Signed)
PROGRESS NOTE    [name]  FHL:[id number] DOB: 11-18-1950 DOA: 11/03/2016 PCP: [name], PA-C  Brief Narrative:  [name] is a 66 y.o. female with a past medical history significant for squamous Lung cancer widely metastatic, HTN who presented with PE by imaging, dyspena and fatigue. The patient was diagnosed with squamous lung cancer in 2017, underwent palliative radiation, then carboplatin and etoposide.  Within last few months, she was referred for peripheral lactic cranial irradiation, but prior to treatment MRI brain showed 36 small metastases. Since then she has been on palliative radiation to the lumbar spine as well as whole brain radiation for the last month.   In the last few weeks, she has been complaining of worsening left-sided abdominal pain or abdominal wall pain, and fatigue, so a week ago her radiation oncology team ordered a CT of the chest abdomen and pelvis which was performed today. In the interim, in the last few days, the patient's son has noticed a sudden change in her activity (she was ambulating herself, getting outside a little, somewhat active, but suddenly stopped). She denied chest pain, but endorses fatigue and dyspnea (and appears to be breathing heavy per son). Imaging showed saddle pulmonary embolism without right heart strain, new left rib soft tissue metastasis, new right supraclavicular metastasis, and widespread osseous metastasis. She was sent to the emergency room.   She was admitted and anticoagulated with Lovenox Full dose and Oncology was consulted. Unfortunately patient developed an acute GI Bleed so A/C was held and Gastroenterology was consulted for evaluation so she will be undergoing a NM Scan (Not done yet). Patient has not had any re-occurrence of bleeding so far and Hb/Hct has been stable. Palliative Care Medicine was consulted for goals of Care as patient wants all treatment interventions offered. After discussion with GI and Palliative  Care patient would like to try Heparin gtt to see if she bleeds again and wishes to speak to her Primary Oncologist to see if he has further interventions for her cancer. Patient open to going to Hospice but wants to talk to Dr. Inda Merlin first to see if he has any other recommendations.   Assessment & Plan:   Principal Problem:   Acute saddle pulmonary embolism without acute cor pulmonale (HCC) Active Problems:   Essential hypertension   Small cell carcinoma of lung, right (HCC)   Hyponatremia   Hypokalemia   Normocytic anemia   Transaminitis   Thrombocytopenia (HCC)   GI bleed   Abnormal LFTs  1. Acute Saddle Pulmonary Embolism -Discussed risks and benefits of anticoagulation with oncology and with the patient. Brain metastasis from lung Ca have somewhat increased risk of hemorrhage, increased by thrombocytopenia, but still feel benefit outweighs risk.   -Lovenox was started 1 mg/kg BID but patient developed and acute Lower GI Bleed so Lovenox Was held -Obtained Echocardiogram and showed Normal EF of 65-70% -Obtained Dopplers of legs and showed The right lower extremity, left common femoral, left greater saphenous veins and left profunda are negative for deep vein thrombosis. The left femoral vein, popliteal, and calf veins are positive for deep vein thrombosis -Consulted Oncology Dr. Lindi Adie for evaluation and further assistance and recommended switching Lovenox to Xarelto upon discharge but this was prior to her having a GI Bleed; After discussion with Gastroenterology and Dr. Lindi Adie will start Heparin gtt to see if patient can tolerate that without bleeding and if she starts bleeding again will switch off Heparin gtt -Dr. Inda Merlin to evaluate and weigh in  tomorrow -Palliative Care Medicine Consulted for Goals of Care and for now patient would like to try Heparin gtt and to see if she bleeds. Family and patient waiting to discuss with Primary Oncologist Dr. Earlie Server about prognosis and to  see if he offers further treatment. -Per Dr. Kirstie Mirza conversation with patient and family, she is open to hospice if Dr. Earlie Server has no further interventions offered for her.   2. Acute Lower GI bleed  -In the setting of Metastatic Cancer and Lovenox A/C for Saddle PE -Type and Screen -Changed Protonix Gtt to IV 40 mg of Protonix at the recommendation of Gastroenterology -Consulted Gastroenterology Dr. Alessandra Bevels -Suspect Etiology from Diverticular Bleed vs. Angiectasia vs. Hemorrhoidal Bleed -Risk of Invasive Procedure extremly high and she is at high risk for complications for invasive workup such as colonoscopy so NM bleeding Scan was ordered but not done. If she starts bleeding again then Gastroenterology recommends NM Bleeding Scan -If bleeding scan shows active Bleeding consult IR for Angiography and Embolization -Continue to Monitor H/H q6h and Transfuse if Hb <8 -Hb/Hct currently stable at 9.4/28.7 (Down from 10.3/31.7 on Admission) -Repeat CBC in AM  3. Abnormal LFT's/Transaminitis:  -AST went from 192 -> 123  -> 136 and ALT went from 190 -> 144 -> 160 -Likely congestive from PE -There was a subtle herterogenous hepatic densitidy which is nonspecific but could represent early hepatic metastasis. Recommend Pre/Post Contrast Abdominal MRI  4. Hypokalemia, improved -Patient's K+ went from 3.3 -> 4.9 -Oral K given -IVF with K D/C'd and started IVF with just NS -Checked Mag and was 1.7 -Replete as Necessary  -Continue to Monitor and Repeat CMP in AM  5. Hyponatremia, improving  -Na+ went from 129 -> 131 -> 133 -Changed IVF NS at 50 mL/hr + KCl to IV NS at 75 mL/hr -Repeat CMP in AM  6. Squamous Cancer of the Lung with Diffuse Metastasis: -CT Abdomen showed Progressive disease, as evidenced by development of right supraclavicular nodal mass and soft tissue/muscular metastasis as well as the Left Chest Wall 10th and 11th ribs. -Continue gabapentin, lorazepam,  Compazine -Continue temazepam for sleep and -Consulted Oncology Dr. Lindi Adie and he feels as if systemic chemotherapy can be difficult to tolerate without great results. -Palliative Care Medicine Consulted for Goals of Care and Meeting today -Wishes to speak with Dr. Inda Merlin to see what he has to offer in terms of treatment prior to making a decision to transitioning to Hospice  7. Thrombocytopenia -Patient's Platelet count went from 86 on Admission to 72 today -Worsening; Suspected to Chemo and XRT treaments -Continue to Monitorand repeat CBC in AM  8. Acute Respiratory Failure with Hypoxia -Likely 2/2 to Saddle PE -C/w Supplemental O2 via [place] and Maintain O2 Saturations > 90% -Continuous Pulse Oximetry and Wean O2 as tolerated  9. Leukocytosis -Patient's WBC went from 10.4 -> 10.9 -Likely reactive to PE -Repeat CBC in AM  10. Elevated Alk Phos -Likely 2/2 to Bone Metastasis -Continue to Monitor CMP's  DVT prophylaxis: Anticoagulated with Heparin gtt Code Status: FULL CODE Family Communication: Discussed with Daughter at Bedside Disposition Plan: Pending discussion with Dr. Earlie Server   Consultants:   Oncology Dr. Lindi Adie  Gastroenterology Dr. Alessandra Bevels  Palliative Care Medicine Dr. Domingo Cocking   Procedures:  ECHOCARDIOGRAM Study Conclusions  - Left ventricle: The cavity size was normal. Systolic function was   vigorous. The estimated ejection fraction was in the range of 65%   to 70%. Wall motion was normal; there were no regional  wall   motion abnormalities. - Tricuspid valve: There was mild-moderate regurgitation. - Pulmonary arteries: Systolic pressure was mildly to moderately   increased. PA peak pressure: 45 mm Hg (S).  Antimicrobials:  Anti-infectives    None     Subjective: Seen and examined and daughter translated. Stated she had an ok night and is not in any pain. States she is still SOB slightly. No re-occurrence of bleeding per patient and family.    Objective: Vitals:   10/23/16 1328 10/23/16 1355 10/23/16 2105 10/24/16 0608  BP: 120/62 (!) 112/59 114/67 114/69  Pulse: (!) 105 (!) 106 (!) 107 (!) 101  Resp: _0 Temp: 98.4 F (36.9 C) 99.2 F (37.3 C) 99.4 F (37.4 C) 98.3 F (36.8 C)  TempSrc: Oral Oral Oral Oral  SpO2: 94% 91% 91% 93%  Weight:      Height:        Intake/Output Summary (Last 24 hours) at 10/24/16 0751 Last data filed at 10/24/16 6314  Gross per 24 hour  Intake          2269.59 ml  Output              400 ml  Net          1869.59 ml   Filed Weights   10/30/2016 1751 11/01/2016 2110  Weight: 67.1 kg (148 lb) 70.2 kg (154 lb 12.2 oz)   Examination: Physical Exam:  Constitutional: 66 yo chronically ill appearing female who is in NAD currently and speaks no English Eyes:  Lids and conjunctivae appear normal.  ENMT: External ears and nares appear normal. Grossly normal hearing and mucous membranes appear moist. Neck: Supple with no JVD. Respiratory: Diminished to ausculation bilaterally. No Wheezing/rales/rhonchi. No accessory muscle use but wearing supplemental O2 via [place] Cardiovascular: Slightly tachycardic rate and rhythm. Left Extremity slightly more swollen than Right.  Abdomen: Bowel sounds present. Soft and non tender to palpate. GU: Deferred Musculoskeletal: No Joint deformities. Has good ROM Skin: Warm and dry. No evidence of rashes or lesions  Neurologic: CN 2-12 grossly in tact.  Psychiatric: Awake and Alert. Pleasant mood and affect.   Data Reviewed: I have personally reviewed following labs and imaging studies  CBC:  Recent Labs Lab 10/24/2016 1401  11/08/2016 1909 10/23/16 0600 10/23/16 1227 10/23/16 1827 10/23/16 2357 10/24/16 0509  WBC 11.5*  --  10.1 10.4  --   --   --  10.9*  NEUTROABS 9.8*  --  8.5*  --   --   --   --  9.4*  HGB 10.3*  < > 9.2* 9.1* 9.3* 9.5* 9.3* 9.2*  HCT 31.7*  < > 28.0* 27.5* 28.6* 29.3* 29.5* 28.7*  MCV 93.2  --  91.8 92.3  --   --   --  95.0   PLT 86*  --  79* 70*  --   --   --  72*  < > = values in this interval not displayed. Basic Metabolic Panel:  Recent Labs Lab 10/20/2016 1401 10/17/2016 1909 10/23/16 0600 10/24/16 0509  NA 133* 129* 131* 133*  K 3.3* 2.5* 3.3* 4.9  CL  --  89* 94* 99*  CO2 _1 GLUCOSE 179* 153* 108* 95  BUN 15._2 CREATININE 0.8 0.58 0.50 0.52  CALCIUM 9.9 8.8* 8.8* 9.2  MG  --  1.8 1.7 1.8  PHOS  --   --  2.9 3.6   GFR: Estimated Creatinine  Clearance: 69.9 mL/min (by C-G formula based on SCr of 0.52 mg/dL). Liver Function Tests:  Recent Labs Lab 10/24/2016 1401 11/05/2016 1909 10/23/16 0600 10/24/16 0509  AST 192* 144* 123* 136*  ALT 190* 158* 144* 160*  ALKPHOS 436* 359* 339* 360*  BILITOT 2.18* 1.4* 1.6* 3.4*  PROT 6.7 5.9* 5.4* 5.5*  ALBUMIN 2.5* 2.6* 2.3* 2.3*   No results for input(s): LIPASE, AMYLASE in the last 168 hours. No results for input(s): AMMONIA in the last 168 hours. Coagulation Profile:  Recent Labs Lab 10/21/2016 1909 10/23/16 0600  INR 1.05 1.07   Cardiac Enzymes: No results for input(s): CKTOTAL, CKMB, CKMBINDEX, TROPONINI in the last 168 hours. BNP (last 3 results) No results for input(s): PROBNP in the last 8760 hours. HbA1C: No results for input(s): HGBA1C in the last 72 hours. CBG: No results for input(s): GLUCAP in the last 168 hours. Lipid Profile: No results for input(s): CHOL, HDL, LDLCALC, TRIG, CHOLHDL, LDLDIRECT in the last 72 hours. Thyroid Function Tests: No results for input(s): TSH, T4TOTAL, FREET4, T3FREE, THYROIDAB in the last 72 hours. Anemia Panel: No results for input(s): VITAMINB12, FOLATE, FERRITIN, TIBC, IRON, RETICCTPCT in the last 72 hours. Sepsis Labs: No results for input(s): PROCALCITON, LATICACIDVEN in the last 168 hours.  No results found for this or any previous visit (from the past 240 hour(s)).   Radiology Studies: Ct Chest W Contrast  Result Date: 10/25/2016 CLINICAL DATA:  Lung cancer diagnosed in  2017. Chemotherapy and radiation therapy complete. Leg swelling fatigue. Hysterectomy. Right-sided small cell primary. EXAM: CT CHEST, ABDOMEN, AND PELVIS WITH CONTRAST TECHNIQUE: Multidetector CT imaging of the chest, abdomen and pelvis was performed following the standard protocol during bolus administration of intravenous contrast. CONTRAST:  ISOVUE-300 IOPAMIDOL (ISOVUE-300) INJECTION 61% COMPARISON:  07/26/2016 and thoracolumbar spine MR 08/28/2016. FINDINGS: CT CHEST FINDINGS Cardiovascular: Aortic atherosclerosis. Borderline cardiomegaly, without pericardial effusion. Lad coronary artery atherosclerosis. Central, saddle type pulmonary embolism, including on image 22/series 2. No evidence of right heart strain. Mediastinum/Nodes: Development of a right supraclavicular nodal mass which measures 4.1 x 5.1 cm on image 9/series 2. No axillary adenopathy. No residual mediastinal adenopathy. No hilar adenopathy. Lungs/Pleura: Trace right pleural fluid is new. Minimal motion degradation throughout. Bilateral paramediastinal upper lobe ground-glass opacity and septal thickening, likely radiation induced. No residual pulmonary nodules are seen. Musculoskeletal: Soft tissue metastasis peripheral to the posterior left tenth and eleventh ribs is new and measures 4.1 x 1.8 cm on image 50/series 2. Similar widespread osseous metastasis. CT ABDOMEN PELVIS FINDINGS Hepatobiliary: Minimal motion continuing into the abdomen. Subtly heterogeneous hepatic density, without well-defined dominant mass. Hepatomegaly at 21.5 cm craniocaudal. Normal gallbladder, without biliary ductal dilatation. Pancreas: Normal, without mass or ductal dilatation. Spleen: Normal in size, without focal abnormality. Adrenals/Urinary Tract: Minimal left adrenal thickening is unchanged. Right adrenal mass measures 3.3 cm and was consistent with an adenoma on prior PET. Heterogeneous renal enhancement, greater on the left. Most apparent on delayed  images. No hydronephrosis. Normal urinary bladder. Stomach/Bowel: Normal stomach, without wall thickening. Normal colon, appendix, and terminal ileum. Normal small bowel. Vascular/Lymphatic: Advanced aortic and branch vessel atherosclerosis. No abdominopelvic adenopathy. Reproductive: Hysterectomy.  No adnexal mass. Other: No significant free fluid. No evidence of omental or peritoneal disease. Musculoskeletal: Soft tissue/muscular metastasis along the left obturator internus muscle measures 3.0 x 1.9 cm on image 109/ series 2 and is new. Heterogeneous marrow density is again identified and indicative of osseous metastasis. A right-sided L2 lesion measures 1.6 cm on  image 67/series 2 and is unchanged. IMPRESSION: 1. Large volume saddle type pulmonary embolism, without right heart strain. These results will be called to the ordering clinician or representative by the Radiologist Assistant, and communication documented in the PACS or zVision Dashboard. 2. Progressive disease, as evidenced by development of right supraclavicular nodal mass and soft tissue/muscular metastasis as detailed above. 3. Subtle heterogeneous hepatic density which is nonspecific but could represent early hepatic metastasis. Recommend attention on follow-up versus more definitive characterization with pre and post contrast abdominal MRI. 4. New small right pleural effusion. 5. Right adrenal adenoma. 6. Heterogeneous renal enhancement, suspicious for pyelonephritis. Correlate with urinalysis. Electronically Signed   By: Abigail Miyamoto M.D.   On: 10/27/2016 16:29   Ct Abdomen Pelvis W Contrast  Result Date: 11/10/2016 CLINICAL DATA:  Lung cancer diagnosed in 2017. Chemotherapy and radiation therapy complete. Leg swelling fatigue. Hysterectomy. Right-sided small cell primary. EXAM: CT CHEST, ABDOMEN, AND PELVIS WITH CONTRAST TECHNIQUE: Multidetector CT imaging of the chest, abdomen and pelvis was performed following the standard protocol during  bolus administration of intravenous contrast. CONTRAST:  150m ISOVUE-300 IOPAMIDOL (ISOVUE-300) INJECTION 61% COMPARISON:  07/26/2016 and thoracolumbar spine MR 08/28/2016. FINDINGS: CT CHEST FINDINGS Cardiovascular: Aortic atherosclerosis. Borderline cardiomegaly, without pericardial effusion. Lad coronary artery atherosclerosis. Central, saddle type pulmonary embolism, including on image 22/series 2. No evidence of right heart strain. Mediastinum/Nodes: Development of a right supraclavicular nodal mass which measures 4.1 x 5.1 cm on image 9/series 2. No axillary adenopathy. No residual mediastinal adenopathy. No hilar adenopathy. Lungs/Pleura: Trace right pleural fluid is new. Minimal motion degradation throughout. Bilateral paramediastinal upper lobe ground-glass opacity and septal thickening, likely radiation induced. No residual pulmonary nodules are seen. Musculoskeletal: Soft tissue metastasis peripheral to the posterior left tenth and eleventh ribs is new and measures 4.1 x 1.8 cm on image 50/series 2. Similar widespread osseous metastasis. CT ABDOMEN PELVIS FINDINGS Hepatobiliary: Minimal motion continuing into the abdomen. Subtly heterogeneous hepatic density, without well-defined dominant mass. Hepatomegaly at 21.5 cm craniocaudal. Normal gallbladder, without biliary ductal dilatation. Pancreas: Normal, without mass or ductal dilatation. Spleen: Normal in size, without focal abnormality. Adrenals/Urinary Tract: Minimal left adrenal thickening is unchanged. Right adrenal mass measures 3.3 cm and was consistent with an adenoma on prior PET. Heterogeneous renal enhancement, greater on the left. Most apparent on delayed images. No hydronephrosis. Normal urinary bladder. Stomach/Bowel: Normal stomach, without wall thickening. Normal colon, appendix, and terminal ileum. Normal small bowel. Vascular/Lymphatic: Advanced aortic and branch vessel atherosclerosis. No abdominopelvic adenopathy. Reproductive:  Hysterectomy.  No adnexal mass. Other: No significant free fluid. No evidence of omental or peritoneal disease. Musculoskeletal: Soft tissue/muscular metastasis along the left obturator internus muscle measures 3.0 x 1.9 cm on image 109/ series 2 and is new. Heterogeneous marrow density is again identified and indicative of osseous metastasis. A right-sided L2 lesion measures 1.6 cm on image 67/series 2 and is unchanged. IMPRESSION: 1. Large volume saddle type pulmonary embolism, without right heart strain. These results will be called to the ordering clinician or representative by the Radiologist Assistant, and communication documented in the PACS or zVision Dashboard. 2. Progressive disease, as evidenced by development of right supraclavicular nodal mass and soft tissue/muscular metastasis as detailed above. 3. Subtle heterogeneous hepatic density which is nonspecific but could represent early hepatic metastasis. Recommend attention on follow-up versus more definitive characterization with pre and post contrast abdominal MRI. 4. New small right pleural effusion. 5. Right adrenal adenoma. 6. Heterogeneous renal enhancement, suspicious for pyelonephritis. Correlate with  urinalysis. Electronically Signed   By: Abigail Miyamoto M.D.   On: 10/16/2016 16:29   Scheduled Meds: . gabapentin  300 mg Oral TID  . mouth rinse  15 mL Mouth Rinse BID  . [START ON 10/27/2016] pantoprazole  40 mg Intravenous Q12H  . sodium chloride flush  3 mL Intravenous Q12H  . temazepam  30 mg Oral QHS   Continuous Infusions: . sodium chloride    . pantoprozole (PROTONIX) infusion 8 mg/hr (10/23/16 2225)    LOS: 2 days   Kerney Elbe, DO [facility] Pager [phone]  If 7PM-7AM, please contact night-coverage [email or web address] Password [facility] 10/24/2016, 7:51 AM

## 2016-10-24 NOTE — Progress Notes (Signed)
*  PRELIMINARY RESULTS* Vascular Ultrasound Bilateral lower extremity venous duplex has been completed.  Preliminary findings: The right lower extremity, left common femoral, left greater saphenous veins and left profunda are negative for deep vein thrombosis.  The left femoral vein, popliteal, and calf veins are positive for deep vein thrombosis.  Preliminary results given to nurse, Sophia at 11:15.  Everrett Coombe 10/24/2016, 11:17 AM

## 2016-10-25 ENCOUNTER — Telehealth: Payer: Self-pay

## 2016-10-25 ENCOUNTER — Ambulatory Visit: Payer: No Typology Code available for payment source | Admitting: Internal Medicine

## 2016-10-25 ENCOUNTER — Other Ambulatory Visit: Payer: No Typology Code available for payment source

## 2016-10-25 DIAGNOSIS — K922 Gastrointestinal hemorrhage, unspecified: Secondary | ICD-10-CM

## 2016-10-25 DIAGNOSIS — D696 Thrombocytopenia, unspecified: Secondary | ICD-10-CM

## 2016-10-25 DIAGNOSIS — R0602 Shortness of breath: Secondary | ICD-10-CM

## 2016-10-25 DIAGNOSIS — C7931 Secondary malignant neoplasm of brain: Secondary | ICD-10-CM

## 2016-10-25 DIAGNOSIS — I2692 Saddle embolus of pulmonary artery without acute cor pulmonale: Principal | ICD-10-CM

## 2016-10-25 LAB — CBC WITH DIFFERENTIAL/PLATELET
Basophils Absolute: 0 10*3/uL (ref 0.0–0.1)
Basophils Relative: 0 %
EOS ABS: 0 10*3/uL (ref 0.0–0.7)
Eosinophils Relative: 0 %
HEMATOCRIT: 29.6 % — AB (ref 36.0–46.0)
HEMOGLOBIN: 9.2 g/dL — AB (ref 12.0–15.0)
LYMPHS ABS: 0.7 10*3/uL (ref 0.7–4.0)
LYMPHS PCT: 9 %
MCH: 29.8 pg (ref 26.0–34.0)
MCHC: 31.1 g/dL (ref 30.0–36.0)
MCV: 95.8 fL (ref 78.0–100.0)
MONOS PCT: 6 %
Monocytes Absolute: 0.5 10*3/uL (ref 0.1–1.0)
Neutro Abs: 6.5 10*3/uL (ref 1.7–7.7)
Neutrophils Relative %: 84 %
Platelets: 73 10*3/uL — ABNORMAL LOW (ref 150–400)
RBC: 3.09 MIL/uL — ABNORMAL LOW (ref 3.87–5.11)
RDW: 20.1 % — ABNORMAL HIGH (ref 11.5–15.5)
WBC: 7.7 10*3/uL (ref 4.0–10.5)

## 2016-10-25 LAB — HEPARIN LEVEL (UNFRACTIONATED): HEPARIN UNFRACTIONATED: 0.22 [IU]/mL — AB (ref 0.30–0.70)

## 2016-10-25 LAB — PHOSPHORUS: PHOSPHORUS: 3 mg/dL (ref 2.5–4.6)

## 2016-10-25 LAB — COMPREHENSIVE METABOLIC PANEL
ALK PHOS: 390 U/L — AB (ref 38–126)
ALT: 156 U/L — ABNORMAL HIGH (ref 14–54)
ANION GAP: 11 (ref 5–15)
AST: 148 U/L — ABNORMAL HIGH (ref 15–41)
Albumin: 2.4 g/dL — ABNORMAL LOW (ref 3.5–5.0)
BILIRUBIN TOTAL: 3.8 mg/dL — AB (ref 0.3–1.2)
BUN: 15 mg/dL (ref 6–20)
CALCIUM: 9 mg/dL (ref 8.9–10.3)
CO2: 23 mmol/L (ref 22–32)
Chloride: 100 mmol/L — ABNORMAL LOW (ref 101–111)
Creatinine, Ser: 0.47 mg/dL (ref 0.44–1.00)
GFR calc non Af Amer: >60 mL/min (ref >60)
Glucose, Bld: 89 mg/dL (ref 65–99)
Potassium: 3.7 mmol/L (ref 3.5–5.1)
SODIUM: 134 mmol/L — AB (ref 135–145)
TOTAL PROTEIN: 5.3 g/dL — AB (ref 6.5–8.1)

## 2016-10-25 LAB — MAGNESIUM: Magnesium: 1.6 mg/dL — ABNORMAL LOW (ref 1.7–2.4)

## 2016-10-25 MED ORDER — MAGNESIUM SULFATE 2 GM/50ML IV SOLN
2.0000 g | Freq: Once | INTRAVENOUS | Status: AC
  Administered 2016-10-25: 2 g via INTRAVENOUS
  Filled 2016-10-25: qty 50

## 2016-10-25 MED ORDER — HEPARIN (PORCINE) IN NACL 100-0.45 UNIT/ML-% IJ SOLN: 1200.0000 [IU]/h | INTRAMUSCULAR | Status: DC

## 2016-10-25 MED ORDER — LORAZEPAM 2 MG/ML IJ SOLN: 2.0000 mg | Freq: Once | INTRAMUSCULAR | Status: DC | PRN

## 2016-10-25 MED ORDER — GLYCOPYRROLATE 0.2 MG/ML IJ SOLN
0.2000 mg | INTRAMUSCULAR | Status: DC | PRN
  Administered 2016-10-26: 0.2 mg via INTRAVENOUS
  Filled 2016-10-25 (×2): qty 1

## 2016-10-25 MED ORDER — LORAZEPAM 2 MG/ML IJ SOLN
1.0000 mg | INTRAMUSCULAR | Status: DC | PRN
  Administered 2016-10-26: 1 mg via INTRAVENOUS
  Filled 2016-10-25: qty 1

## 2016-10-25 MED ORDER — MORPHINE SULFATE (PF) 4 MG/ML IV SOLN
2.0000 mg | INTRAVENOUS | Status: DC | PRN
Start: 2016-10-25 — End: 2016-10-26
  Administered 2016-10-25: 3 mg via INTRAVENOUS
  Administered 2016-10-25: 2 mg via INTRAVENOUS
  Administered 2016-10-25: 3 mg via INTRAVENOUS
  Administered 2016-10-25 – 2016-10-26 (×5): 4 mg via INTRAVENOUS
  Filled 2016-10-25 (×8): qty 1

## 2016-10-25 MED ORDER — HALOPERIDOL LACTATE 5 MG/ML IJ SOLN: 1.0000 mg | Freq: Four times a day (QID) | INTRAMUSCULAR | Status: DC | PRN

## 2016-10-25 NOTE — Progress Notes (Signed)
PROGRESS NOTE    [name]  FGH:[id number] DOB: 05/28/1951 DOA: 11/07/2016 PCP: [name], PA-C  Brief Narrative:  [name] is a 66 y.o. female with a past medical history significant for squamous Lung cancer widely metastatic, HTN who presented with PE by imaging, dyspena and fatigue. The patient was diagnosed with squamous lung cancer in 2017, underwent palliative radiation, then carboplatin and etoposide.  Within last few months, she was referred for peripheral lactic cranial irradiation, but prior to treatment MRI brain showed 36 small metastases. Since then she has been on palliative radiation to the lumbar spine as well as whole brain radiation for the last month.   In the last few weeks, she has been complaining of worsening left-sided abdominal pain or abdominal wall pain, and fatigue, so a week ago her radiation oncology team ordered a CT of the chest abdomen and pelvis which was performed today. In the interim, in the last few days, the patient's son has noticed a sudden change in her activity (she was ambulating herself, getting outside a little, somewhat active, but suddenly stopped). She denied chest pain, but endorses fatigue and dyspnea (and appears to be breathing heavy per son). Imaging showed saddle pulmonary embolism without right heart strain, new left rib soft tissue metastasis, new right supraclavicular metastasis, and widespread osseous metastasis. She was sent to the emergency room.   She was admitted and anticoagulated with Lovenox Full dose and Oncology was consulted. Unfortunately patient developed an acute GI Bleed so A/C was held and Gastroenterology was consulted for evaluation so she will be undergoing a NM Scan. Patient has not had any re-occurrence of bleeding so far and Hb/Hct has been stable. Palliative Care Medicine was consulted for goals of Care as patient wants all treatment interventions offered. After discussion with GI and Palliative Care patient  would like to try Heparin gtt to see if she bleeds again and wishes to speak to her Primary Oncologist to see if he has further interventions for her cancer. Patient open to going to Hospice but wants to talk to Dr. Inda Merlin first to see if he has any other recommendations.  Dr. Inda Merlin came to see the patient and felt as if she would not be a good candidate to start systemic chemotherapy again and recommended Palliative Care and Hospice at this point. Dr. Domingo Cocking met with the family and the patient and they would like to pursue placement at [facility] for end of life care with a focus on comfort. Will D/C Heparin gtt today and observe overnight for acute change pursue residential hospice placement tomorrow. Comfort Care order set started by Palliative Care.    Assessment & Plan:   Principal Problem:   Acute saddle pulmonary embolism without acute cor pulmonale (HCC) Active Problems:   Essential hypertension   Small cell carcinoma of lung, right (HCC)   Hyponatremia   Hypokalemia   Normocytic anemia   Transaminitis   Thrombocytopenia (HCC)   GI bleed   Abnormal LFTs  1. Acute Saddle Pulmonary Embolism -Discussed risks and benefits of anticoagulation with oncology and with the patient. Brain metastasis from lung Ca have somewhat increased risk of hemorrhage, increased by thrombocytopenia, but still feel benefit outweighs risk.   -Lovenox was started 1 mg/kg BID but patient developed and acute Lower GI Bleed so Lovenox Was held -Obtained Echocardiogram and showed Normal EF of 65-70% -Obtained Dopplers of legs and showed The right lower extremity, left common femoral, left greater saphenous veins and left profunda are  negative for deep vein thrombosis. The left femoral vein, popliteal, and calf veins are positive for deep vein thrombosis -Dr. Inda Merlin to evaluated and does not feel as if she would be a systemic chemotherapy candidate and recommends hospice -Palliative Care Medicine Consulted  for Goals of Care and family now wants to pursue Hospice -Heparin gtt discontinued -All medications not in line for patient's comfort discontinued.  -Patiant started on IV Lorazepam and IV Morphine  2. Acute Lower GI bleed  -In the setting of Metastatic Cancer and Lovenox A/C for Saddle PE -Type and Screen -C/w IV Protonix 40 mg at the recommendation of Gastroenterology -Consulted Gastroenterology Dr. Alessandra Bevels -Suspect Etiology from Diverticular Bleed vs. Angiectasia vs. Hemorrhoidal Bleed -Risk of Invasive Procedure extremly high and she is at high risk for complications for invasive workup such as colonoscopy so NM bleeding Scan was ordered but not done. If she starts bleeding again then Gastroenterology recommended NM Bleeding Scan but cancelled it -Will not repeat Blood work again as goal is shifting toward comfort and Hospice  3. Abnormal LFT's/Transaminitis:  -AST went from 192 -> 123  -> 136 -> 148 and ALT went from 190 -> 144 -> 160 -> 156 -Likely congestive from PE -There was a subtle herterogenous hepatic densitidy which is nonspecific but could represent early hepatic metastasis. Recommend Pre/Post Contrast Abdominal MRI -Will not repeat Blood work now that goals of care have shifted and patient to be Hospice  4. Hypokalemia, improved -Patient's K+ went from 3.3 -> 4.9 -> 3.7 -Oral K given -IVF with K D/C'd and started IVF with just NS -Checked Mag and was 1.6 -Will not repeat blood work now that patient is comfort care  5. Hyponatremia, improving  -Na+ went from 129 -> 131 -> 133 -> 134 -C/w KCl to IV NS at 75 mL/hr -Will not repeat Blood work as patient is Comfort Measures and transitioning to Hospice  6. Hypomagnesemia -Mag Level this AM was 1.6 -Replete with 2 grams of IV Mag Sulfate -Will not repeat blood work as patient is now Comfort measures and transitioning to Hospice  7. Squamous Cancer of the Lung with Diffuse Metastasis: -CT Abdomen showed  Progressive disease, as evidenced by development of right supraclavicular nodal mass and soft tissue/muscular metastasis as well as the Left Chest Wall 10th and 11th ribs. -Continue gabapentin, lorazepam, Compazine -Continue temazepam for sleep  -Dr. Inda Merlin does not feel like she is a candidate for Systemic Chemotherapy anymore and recommends [place] for Goals of Care and Patient to transition to [facility] hopefully tomorrow  8. Thrombocytopenia -Patient's Platelet count went from 86 on Admission to 73 today -Will not check Bloodwork as patient is now pursuing Hospice  9. Acute Respiratory Failure with Hypoxia -Likely 2/2 to Saddle PE -C/w Supplemental O2 via [place] and Maintain O2 Saturations > 90% -Continuous Pulse Oximetry and Wean O2 as tolerated  10. Leukocytosis -Patient's WBC went from 10.4 -> 10.9 -> 9.6 -> 7.7 -Likely reactive to PE -Will not repeat Bloodwork  11 Elevated Alk Phos -Likely 2/2 to Bone Metastasis  DVT prophylaxis: COMFORT CARE Code Status: DO NOT RESUSCITATE, COMFORT CARE Family Communication: Discussed with Son at Bedside Disposition Plan: Residential Hospice Hopefully tomorrow  Consultants:   Oncology Dr. Alcide Evener  Gastroenterology Dr. Hoy Morn. Michail Sermon  Palliative Care Medicine Dr. Domingo Cocking   Procedures:  ECHOCARDIOGRAM Study Conclusions  - Left ventricle: The cavity size was normal. Systolic function was   vigorous. The estimated ejection fraction was  in the range of 65%   to 70%. Wall motion was normal; there were no regional wall   motion abnormalities. - Tricuspid valve: There was mild-moderate regurgitation. - Pulmonary arteries: Systolic pressure was mildly to moderately   increased. PA peak pressure: 45 mm Hg (S).  Antimicrobials:  Anti-infectives    None     Subjective: Seen and examined and son translated. Stated she was in some pain. NM GI bleeding scan cancelled. After  family met with Dr. Inda Merlin and Dr. Domingo Cocking, patient pursing hospice.   Objective: Vitals:   10/24/16 0608 10/24/16 1458 10/24/16 2043 10/25/16 0559  BP: 114/69 111/61 115/67 124/78  Pulse: (!) 101 (!) 102 100 (!) 112  Resp: '20 20 20 20  ' Temp: 98.3 F (36.8 C) 97.9 F (36.6 C) 98.1 F (36.7 C) 98.3 F (36.8 C)  TempSrc: Oral Oral Oral Oral  SpO2: 93% 97% 94% 92%  Weight:      Height:        Intake/Output Summary (Last 24 hours) at 10/25/16 0749 Last data filed at 10/25/16 0640  Gross per 24 hour  Intake          1931.29 ml  Output             1000 ml  Net           931.29 ml   Filed Weights   10/21/2016 1751 10/24/2016 2110  Weight: 67.1 kg (148 lb) 70.2 kg (154 lb 12.2 oz)   Examination: Physical Exam:  Constitutional: 66 yo [place] female who speaks no English is chronically ill appearing and in some pain today Eyes:  Sclerae anicteric, Conjunctivae noninjected ENMT: Mucous Membranes appear moist. External ears and nares appear normal.  Neck: No evidence of thyromegaly or JVD.  Respiratory: Breath sounds are diminished at the bases. Patient is slightly tachypenic. No wheezing or rales. Wearing supplemental O2 via [place] Cardiovascular: Tachycardic rate and regular rhythm. Left leg slightly bigger than right.  Abdomen: Soft, tender on left side of abdomen. Non-distended. BS present.  GU: Deferred Musculoskeletal: No contractures. No cyanosis  Skin: No evidence of rashes or bruising. Warm and dry. Neurologic: CN appear intact. Moves extremities independently Psychiatric: Anxious appearing and flat affect.    Data Reviewed: I have personally reviewed following labs and imaging studies  CBC:  Recent Labs Lab 10/24/2016 1401  10/16/2016 1909 10/23/16 0600  10/23/16 2357 10/24/16 0509 10/24/16 1122 10/24/16 2313 10/25/16 0522  WBC 11.5*  --  10.1 10.4  --   --  10.9*  --  9.6 7.7  NEUTROABS 9.8*  --  8.5*  --   --   --  9.4*  --   --  6.5  HGB 10.3*  < > 9.2* 9.1*  < >  9.3* 9.2* 9.4* 8.8* 9.2*  HCT 31.7*  < > 28.0* 27.5*  < > 29.5* 28.7* 28.7* 27.5* 29.6*  MCV 93.2  --  91.8 92.3  --   --  95.0  --  94.8 95.8  PLT 86*  --  79* 70*  --   --  72*  --  72* 73*  < > = values in this interval not displayed. Basic Metabolic Panel:  Recent Labs Lab 10/23/2016 1401 10/28/2016 1909 10/23/16 0600 10/24/16 0509 10/25/16 0522  NA 133* 129* 131* 133* 134*  K 3.3* 2.5* 3.3* 4.9 3.7  CL  --  89* 94* 99* 100*  CO2 '28 29 28 24 23  ' GLUCOSE 179* 153* 108* 95  89  BUN 15.'7 14 12 15 15  ' CREATININE 0.8 0.58 0.50 0.52 0.47  CALCIUM 9.9 8.8* 8.8* 9.2 9.0  MG  --  1.8 1.7 1.8 1.6*  PHOS  --   --  2.9 3.6 3.0   GFR: Estimated Creatinine Clearance: 69.9 mL/min (by C-G formula based on SCr of 0.47 mg/dL). Liver Function Tests:  Recent Labs Lab 10/27/2016 1401 11/03/2016 1909 10/23/16 0600 10/24/16 0509 10/25/16 0522  AST 192* 144* 123* 136* 148*  ALT 190* 158* 144* 160* 156*  ALKPHOS 436* 359* 339* 360* 390*  BILITOT 2.18* 1.4* 1.6* 3.4* 3.8*  PROT 6.7 5.9* 5.4* 5.5* 5.3*  ALBUMIN 2.5* 2.6* 2.3* 2.3* 2.4*   No results for input(s): LIPASE, AMYLASE in the last 168 hours. No results for input(s): AMMONIA in the last 168 hours. Coagulation Profile:  Recent Labs Lab 10/29/2016 1909 10/23/16 0600  INR 1.05 1.07   Cardiac Enzymes: No results for input(s): CKTOTAL, CKMB, CKMBINDEX, TROPONINI in the last 168 hours. BNP (last 3 results) No results for input(s): PROBNP in the last 8760 hours. HbA1C: No results for input(s): HGBA1C in the last 72 hours. CBG: No results for input(s): GLUCAP in the last 168 hours. Lipid Profile: No results for input(s): CHOL, HDL, LDLCALC, TRIG, CHOLHDL, LDLDIRECT in the last 72 hours. Thyroid Function Tests: No results for input(s): TSH, T4TOTAL, FREET4, T3FREE, THYROIDAB in the last 72 hours. Anemia Panel: No results for input(s): VITAMINB12, FOLATE, FERRITIN, TIBC, IRON, RETICCTPCT in the last 72 hours. Sepsis Labs: No results  for input(s): PROCALCITON, LATICACIDVEN in the last 168 hours.  No results found for this or any previous visit (from the past 240 hour(s)).   Radiology Studies: No results found. Scheduled Meds: . gabapentin  300 mg Oral TID  . mouth rinse  15 mL Mouth Rinse BID  . pantoprazole (PROTONIX) IV  40 mg Intravenous Q24H  . sodium chloride flush  3 mL Intravenous Q12H  . temazepam  30 mg Oral QHS   Continuous Infusions: . sodium chloride 75 mL/hr at 10/24/16 2254  . heparin 1,050 Units/hr (10/25/16 0012)  . magnesium sulfate 1 - 4 g bolus IVPB      LOS: 3 days   Kerney Elbe, DO [facility] Pager [phone]  If 7PM-7AM, please contact night-coverage [email or web address] Password [facility] 10/25/2016, 7:49 AM

## 2016-10-25 NOTE — Progress Notes (Signed)
Patient ID: [name], female   DOB: 07/24/1950, 66 y.o.   MRN: [id number]  Oncology note reviewed. Agree with palliative measures. Would not recommend any invasive GI procedures. Spoke with family. Will sign off. Call if questions.

## 2016-10-25 NOTE — Progress Notes (Signed)
Daily Progress Note   Patient Name: [name]       Date: 10/25/2016 DOB: 1950/11/08  Age: 66 y.o. MRN#: [id number] Attending Physician: Kerney Elbe, DO Primary Care Physician: [name], PA-C Admit Date: 10/24/2016  Reason for Consultation/Follow-up: Establishing goals of care  Subjective: I met today with patient's son/HCPOA. Zeljko.  He reports that he has been discussing overall situation with his mother, siblings, father, and also spoke with Dr. Julien Nordmann this morning.  Family understands her poor prognosis from cancer, saddle embolus/DVT, and GI bleed.  He reports discussing if further disease modifying therapy is likely to be beneficial with Dr. Julien Nordmann this morning.  Family has decided that, even if she were to recover enough to be eligible for further chemo, it is not something she would desire to pursue.  He is clear that his mother would like to pursue placement at [facility] for end of life care.  We discussed care at [facility] and a focus on comfort and dignity as she approaches the end of her life.  We also discussed about continuation of anticoagulation and that this is not something that would be continued at [facility].  Length of Stay: 3  Current Medications: Scheduled Meds:  . gabapentin  300 mg Oral TID  . mouth rinse  15 mL Mouth Rinse BID  . pantoprazole (PROTONIX) IV  40 mg Intravenous Q24H  . sodium chloride flush  3 mL Intravenous Q12H  . temazepam  30 mg Oral QHS    Continuous Infusions: . sodium chloride 75 mL/hr at 10/25/16 1102    PRN Meds: glycopyrrolate, haloperidol lactate, LORazepam, LORazepam, morphine injection, ondansetron **OR** ondansetron (ZOFRAN) IV, prochlorperazine  Physical Exam  General: Alert, awake, in no acute  distress. Chronically ill appearing HEENT: No bruits, no goiter, no JVD Heart: Regular rate and rhythm. No murmur appreciated. Lungs: Diminished air movement, clear Abdomen: Soft, nontender, nondistended, positive bowel sounds.  Skin: Warm and dry Neuro: Grossly intact, nonfocal.         Vital Signs: BP 116/81 (BP Location: Right Arm)   Pulse (!) 122   Temp 98.1 F (36.7 C) (Oral)   Resp (!) 22   Ht '5\' 5"'  (1.651 m)   Wt 70.2 kg (154 lb 12.2 oz)   SpO2 91%   BMI 25.75  kg/m  SpO2: SpO2: 91 % O2 Device: O2 Device: Nasal Cannula O2 Flow Rate: O2 Flow Rate (L/min): 5 L/min  Intake/output summary:  Intake/Output Summary (Last 24 hours) at 10/25/16 1605 Last data filed at 10/25/16 1500  Gross per 24 hour  Intake          2054.98 ml  Output             1000 ml  Net          1054.98 ml   LBM: Last BM Date: 10/24/16 Baseline Weight: Weight: 67.1 kg (148 lb) Most recent weight: Weight: 70.2 kg (154 lb 12.2 oz)       Palliative Assessment/Data:    Flowsheet Rows     Most Recent Value  Intake Tab  Referral Department  Hospitalist  Unit at Time of Referral  Med/Surg Unit  Palliative Care Primary Diagnosis  Cancer  Date Notified  10/23/16  Palliative Care Type  New Palliative care  Reason for referral  Clarify Goals of Care  Date of Admission  11/03/2016  Date first seen by Palliative Care  10/23/16  # of days Palliative referral response time  0 Day(s)  # of days IP prior to Palliative referral  1  Clinical Assessment  Palliative Performance Scale Score  30%  Pain Max last 24 hours  3  Pain Min Last 24 hours  0  Psychosocial & Spiritual Assessment  Palliative Care Outcomes  Patient/Family meeting held?  Yes  Who was at the meeting?  Patient, 2 sons, husband, daughter  Palliative Care Outcomes  Clarified goals of care      Patient Active Problem List   Diagnosis Date Noted  . GI bleed 10/23/2016  . Abnormal LFTs 10/23/2016  . Acute saddle pulmonary embolism  without acute cor pulmonale ([place]) 11/04/2016  . Hyponatremia 10/21/2016  . Hypokalemia 11/11/2016  . Normocytic anemia 10/14/2016  . Transaminitis 10/20/2016  . Thrombocytopenia ([place]) 10/25/2016  . Insomnia 06/15/2016  . Encounter for antineoplastic chemotherapy 03/30/2016  . Small cell carcinoma of lung, right ([place]) 03/17/2016  . PTSD (post-traumatic stress disorder) 07/18/2011  . SMOKER 03/30/2010  . Essential hypertension 03/30/2010  . HYPERCHOLESTEROLEMIA 03/27/2010  . ARTHRITIS 03/27/2010  . CHEST PAIN 03/27/2010    Palliative Care Assessment & Plan   Patient Profile: 66 y.o. female  with past medical history of extensive small cell lung cancer discovered in October of this year. She is status post radiation as well as chemotherapy. She had a new finding of brain metastases in March of this year and also had whole brain radiation as well as spinal radiation. She had a CT scan on 10/29/2016 and was subsequently admitted after discovery of saddle pulmonary embolus with noted progression of her cancer.  Her course was further complicated by GI bleed following initiation of Lovenox therapy for PE.  Has since been restarted on heparin.  Palliative consulted for goals of care.     Recommendations/Plan:  Family wants to pursue placement at [facility] for end of life care with a focus on comfort.  Social work consult placed.  Plan to d/c heparin today.  Would recommend observe overnight for acute change and pursue placement at residential hospice tomorrow.  Added medications as needed for comfort, including morphine for SOB and ativan for anxiety.  Goals of Care and Additional Recommendations:  Limitations on Scope of Treatment: Full Comfort Care  Code Status:    Code Status Orders        Start  Ordered   10/24/16 1646  Do not attempt resuscitation (DNR)  Continuous    Question Answer Comment  In the event of cardiac or respiratory ARREST Do not call a "code blue"   In  the event of cardiac or respiratory ARREST Do not perform Intubation, CPR, defibrillation or ACLS   In the event of cardiac or respiratory ARREST Use medication by any route, position, wound care, and other measures to relive pain and suffering. May use oxygen, suction and manual treatment of airway obstruction as needed for comfort.      10/24/16 1645    Code Status History    Date Active Date Inactive Code Status Order ID Comments User Context   10/14/2016  8:20 PM 10/24/2016  4:45 PM Full Code [id number]  Edwin Dada, MD ED    Advance Directive Documentation     Most Recent Value  Type of Advance Directive  Healthcare Power of Attorney, Living will  Pre-existing out of facility DNR order (yellow form or pink MOST form)  -  "MOST" Form in Place?  -       Prognosis:   < 2 weeks: She has advanced cancer with recent GI bleed.  She has a known saddle PE as well as extensive known DVT and plan to stop anticoagulation.  I believe her prognosis will be less than 2 weeks and she is at very high risk of death with high symptom burden (either from bleeding, or, more likely, massive PE) and would best be served by placement in residential hospice for end-of-life care if this can be arranged.   Discharge Planning:  Hospice facility  Care plan was discussed with patient, son, Dr. Alfredia Ferguson, [place], SW   Thank you for allowing the Palliative Medicine Team to assist in the care of this patient.   Total Time 45 minutes Prolonged Time Billed No      Greater than 50%  of this time was spent counseling and coordinating care related to the above assessment and plan.  Micheline Rough, MD  Please contact Palliative Medicine Team phone at [phone] for questions and concerns.

## 2016-10-25 NOTE — Progress Notes (Signed)
[place] for heparin Indication: pulmonary embolus  Allergies  Allergen Reactions  . Penicillins Swelling, Rash and Other (See Comments)    Reaction:  Facial swelling and high BP Has patient had a PCN reaction causing immediate rash, facial/tongue/throat swelling, SOB or lightheadedness with hypotension: Yes Has patient had a PCN reaction causing severe rash involving mucus membranes or skin necrosis: No Has patient had a PCN reaction that required hospitalization No Has patient had a PCN reaction occurring within the last 10 years: No If all of the above answers are "NO", then may proceed with Cephalosporin use.    Patient Measurements: Height: _0  (165.1 cm) Weight: 154 lb 12.2 oz (70.2 kg) IBW/kg (Calculated) : 57 Heparin Dosing Weight: 70.2  Vital Signs: Temp: 98.3 F (36.8 C) (05/14 0559) Temp Source: Oral (05/14 0559) BP: 124/78 (05/14 0559) Pulse Rate: 112 (05/14 0559)  Labs:  Recent Labs  11/08/2016 1909 10/23/16 0600  10/24/16 0509 10/24/16 1122 10/24/16 2313 10/25/16 0522 10/25/16 0847  HGB 9.2* 9.1*  < > 9.2* 9.4* 8.8* 9.2*  --   HCT 28.0* 27.5*  < > 28.7* 28.7* 27.5* 29.6*  --   PLT 79* 70*  --  72*  --  72* 73*  --   APTT  --  37*  --   --   --   --   --   --   LABPROT 13.8 14.0  --   --   --   --   --   --   INR 1.05 1.07  --   --   --   --   --   --   HEPARINUNFRC  --   --   --   --   --  0.11*  --  0.22*  CREATININE 0.58 0.50  --  0.52  --   --  0.47  --   < > = values in this interval not displayed.  Estimated Creatinine Clearance: 69.9 mL/min (by C-G formula based on SCr of 0.47 mg/dL).  Assessment: 45 YOF with acute saddle PE and DVT initially started on enoxaparin but developed painless hematochezia so changed to heparin gtt. She has met small cell lung cancer.   Today, 10/25/2016  Heparin level remains SUBtherapeutic (= 0.22) following rate increase to 1050 units/hr.   CBC:  Hgb stable, pltc  decreased but stable  No signs of bleeding with last BM  Gross hematuria noted from 5/13  Goal of Therapy:  Heparin level 0.3-0.7 units/ml Monitor platelets by anticoagulation protocol   Plan:   Increase heparin to 1200 units/hr  Follow up plans, possible may stop all anticoagulation and discharge to residential hospice  May also change back to [place] depending on plan of care   Check 6h heparin level  Daily CBC  Monitor for    Doreene Eland, PharmD, BCPS.   Pager: [phone] 10/25/2016 9:57 AM

## 2016-10-25 NOTE — Telephone Encounter (Addendum)
Saddle PE hospice tomorrow with comfort care dr Alfredia Ferguson for questions

## 2016-10-25 NOTE — Progress Notes (Signed)
ANTICOAGULATION CONSULT NOTE - Follow Up Consult  Pharmacy Consult for Heparin Indication: pulmonary embolus  Allergies  Allergen Reactions  . Penicillins Swelling, Rash and Other (See Comments)    Reaction:  Facial swelling and high BP Has patient had a PCN reaction causing immediate rash, facial/tongue/throat swelling, SOB or lightheadedness with hypotension: Yes Has patient had a PCN reaction causing severe rash involving mucus membranes or skin necrosis: No Has patient had a PCN reaction that required hospitalization No Has patient had a PCN reaction occurring within the last 10 years: No If all of the above answers are "NO", then may proceed with Cephalosporin use.    Patient Measurements: Height: '5\' 5"'$  (165.1 cm) Weight: 154 lb 12.2 oz (70.2 kg) IBW/kg (Calculated) : 57 Heparin Dosing Weight:   Vital Signs: Temp: 98.1 F (36.7 C) (05/13 2043) Temp Source: Oral (05/13 2043) BP: 115/67 (05/13 2043) Pulse Rate: 100 (05/13 2043)  Labs:  Recent Labs  10/19/2016 1909 10/23/16 0600  10/24/16 0509 10/24/16 1122 10/24/16 2313  HGB 9.2* 9.1*  < > 9.2* 9.4* 8.8*  HCT 28.0* 27.5*  < > 28.7* 28.7* 27.5*  PLT 79* 70*  --  72*  --  72*  APTT  --  37*  --   --   --   --   LABPROT 13.8 14.0  --   --   --   --   INR 1.05 1.07  --   --   --   --   HEPARINUNFRC  --   --   --   --   --  0.11*  CREATININE 0.58 0.50  --  0.52  --   --   < > = values in this interval not displayed.  Estimated Creatinine Clearance: 69.9 mL/min (by C-G formula based on SCr of 0.52 mg/dL).   Medications:  Infusions:  . sodium chloride 75 mL/hr at 10/24/16 2254  . heparin 800 Units/hr (10/24/16 1806)    Assessment: Patient with heparin level below goal.  No heparin issues per RN.  Goal of Therapy:  Heparin level 0.3-0.7 units/ml Monitor platelets by anticoagulation protocol: Yes   Plan:  Increase heparin to 1050 units/hr Recheck level at 0900  Tyler Deis, Shea Stakes [place]ford 10/25/2016,12:11  AM

## 2016-10-25 NOTE — Progress Notes (Signed)
Called by RN to see pt. concerning <'d sats, chart assessed upon arrival to floor, pt. With 83-84% sats on 6 lpm n/c, rate 16-18, b/l b.s. mostly clear, diminished in bases with mild upper airway loose secretions, upon my arrival to room, son @ bedside had pt. cough with only mild temporary clearance, placed pt. on 14 lpm/55% Venti mask, with sats gradually >'ing to 93-95%, RN @ bedside, made aware to notify if needed, NP being notified.

## 2016-10-25 NOTE — Clinical Social Work Note (Signed)
LCSW received consult for inpatient hospice.  LCSW received choice for [facility] from patient's son.  LCSW called and spoke with Erline Levine, liaison for [facility] who stated that she would follow up with family. Possible discharge to [facility].  Dede Query, [place] Worker  cell #: [phone]

## 2016-10-25 NOTE — Progress Notes (Signed)
Subjective: The patient is seen and examined today. I also spoke to her son over the phone. She will pleasant 66 years old white female with extensive stage small cell lung cancer. She was initially treated with systemic chemotherapy with carboplatin and etoposide as well as palliative radiation and brain irradiation for metastatic brain lesion. Unfortunately her condition continues to deteriorate and the patient was admitted to [facility] recently with liver dysfunction, pulmonary embolism and gastrointestinal bleeding. She also looks cushingoid from her treatment with Decadron. She denied having any current nausea or vomiting. She has some abdominal pain.  Objective: Vital signs in last 24 hours: Temp:  [97.9 F (36.6 C)-98.3 F (36.8 C)] 98.3 F (36.8 C) (05/14 0559) Pulse Rate:  [100-112] 112 (05/14 0559) Resp:  [20] 20 (05/14 0559) BP: (111-124)/(61-78) 124/78 (05/14 0559) SpO2:  [92 %-97 %] 92 % (05/14 0559)  Intake/Output from previous day: 05/13 0701 - 05/14 0700 In: 1931.3 [I.V.:1931.3] Out: 1200 [Urine:1200] Intake/Output this shift: No intake/output data recorded.  General appearance: alert, cooperative, fatigued and no distress Resp: clear to auscultation bilaterally Cardio: regular rate and rhythm, S1, S2 normal, no murmur, click, rub or gallop GI: soft, non-tender; bowel sounds normal; no masses,  no organomegaly Extremities: extremities normal, atraumatic, no cyanosis or edema  Lab Results:   Recent Labs  10/24/16 2313 10/25/16 0522  WBC 9.6 7.7  HGB 8.8* 9.2*  HCT 27.5* 29.6*  PLT 72* 73*   BMET  Recent Labs  10/24/16 0509 10/25/16 0522  NA 133* 134*  K 4.9 3.7  CL 99* 100*  CO2 24 23  GLUCOSE 95 89  BUN 15 15  CREATININE 0.52 0.47  CALCIUM 9.2 9.0    Studies/Results: No results found.  Medications: I have reviewed the patient's current medications.  CODE STATUS: No CODE BLUE  Assessment/Plan: This is a very pleasant 66 years old  white female with multiple medical problems including: 1) progressive extensive stage small cell lung cancer. This was initially treated with 6 cycles of systemic chemotherapy with carboplatin and etoposide with partial response. Unfortunately the recent CT scan of the chest, abdomen and pelvis showed evidence for disease progression. The patient also has liver dysfunction and thrombocytopenia and I'm not sure she will be a good candidate to start systemic chemotherapy again. I strongly recommended for her palliative care and hospice at this point. I spoke to the son and he is on board with this option. 2) New pulmonary embolism: Continue current treatment with Lovenox. Monitor closely for any bleeding issues especially with her history of gastrointestinal bleeding. 3) prognosis: Very poor. 4) CODE STATUS: No CODE BLUE. Thank you for taking good care of Ms. Cinquemani. I will continue to follow the patient with you and assist in her management on as-needed basis.   LOS: 3 days    Laterrica Libman K. 10/25/2016

## 2016-10-26 DIAGNOSIS — J9601 Acute respiratory failure with hypoxia: Secondary | ICD-10-CM

## 2016-10-26 MED ORDER — MORPHINE SULFATE 25 MG/ML IV SOLN
2.0000 mg/h | INTRAVENOUS | Status: DC
  Administered 2016-10-26: 2 mg/h via INTRAVENOUS
  Filled 2016-10-26: qty 10

## 2016-10-27 LAB — TYPE AND SCREEN
ABO/RH(D): AB NEG
Antibody Screen: NEGATIVE
Unit division: 0

## 2016-10-27 LAB — BPAM RBC
Blood Product Expiration Date: 201805212359
UNIT TYPE AND RH: 600

## 2016-11-12 NOTE — Progress Notes (Signed)
Daily Progress Note   Patient Name: [name]       Date: Nov 16, 2016 DOB: 01-23-1951  Age: 66 y.o. MRN#: [id number] Attending Physician: Kerney Elbe, DO Primary Care Physician: [name], PA-C Admit Date: 10/25/2016  Reason for Consultation/Follow-up: Establishing goals of care  Subjective: I met today with patient's son/HCPOA. Zeljko.  He spent the night by his mother's bedside, she has had ongoing low O2 saturations, she is now unresponsive, she has rapid shallow irregular breathing. Son is hesitant about moving the patient to hospice home.    Length of Stay: 4  Current Medications: Scheduled Meds:  . mouth rinse  15 mL Mouth Rinse BID  . sodium chloride flush  3 mL Intravenous Q12H    Continuous Infusions: . sodium chloride 75 mL/hr at 16-Nov-2016 0049  . morphine      PRN Meds: glycopyrrolate, haloperidol lactate, LORazepam, LORazepam, morphine injection, ondansetron **OR** ondansetron (ZOFRAN) IV  Physical Exam  General: unresponsive, rapid shallow noisy breathing . Chronically ill appearing HEENT: No bruits, no goiter, no JVD Heart: Regular rate and rhythm. No murmur appreciated. Lungs: coarse congested breath sounds anteriorly Abdomen: Soft, nontender, nondistended, positive bowel sounds.  Skin: Warm and dry Neuro: Grossly intact, nonfocal.         Vital Signs: BP 115/78 (BP Location: Left Arm)   Pulse (!) 145   Temp 98.9 F (37.2 C) (Axillary)   Resp (!) 23   Ht _0  (1.651 m)   Wt 70.2 kg (154 lb 12.2 oz)   SpO2 (!) 82%   BMI 25.75 kg/m  SpO2: SpO2: (!) 82 % O2 Device: O2 Device: Venturi Mask O2 Flow Rate: O2 Flow Rate (L/min): 14 L/min  Intake/output summary:   Intake/Output Summary (Last 24 hours) at 11-16-2016 0840 Last data filed  at 11-16-16 0452  Gross per 24 hour  Intake           763.28 ml  Output              800 ml  Net           -36.72 ml   LBM: Last BM Date: 10/24/16 Baseline Weight: Weight: 67.1 kg (148 lb) Most recent weight: Weight: 70.2 kg (154 lb 12.2 oz)       Palliative Assessment/Data:    Flowsheet  Rows     Most Recent Value  Intake Tab  Referral Department  Hospitalist  Unit at Time of Referral  Med/Surg Unit  Palliative Care Primary Diagnosis  Cancer  Date Notified  10/23/16  Palliative Care Type  New Palliative care  Reason for referral  Clarify Goals of Care  Date of Admission  11/03/2016  Date first seen by Palliative Care  10/23/16  # of days Palliative referral response time  0 Day(s)  # of days IP prior to Palliative referral  1  Clinical Assessment  Palliative Performance Scale Score  30%  Pain Max last 24 hours  3  Pain Min Last 24 hours  0  Psychosocial & Spiritual Assessment  Palliative Care Outcomes  Patient/Family meeting held?  Yes  Who was at the meeting?  Patient, 2 sons, husband, daughter  Palliative Care Outcomes  Clarified goals of care      Patient Active Problem List   Diagnosis Date Noted  . GI bleed 10/23/2016  . Abnormal LFTs 10/23/2016  . Acute saddle pulmonary embolism without acute cor pulmonale ([place]) 10/29/2016  . Hyponatremia 10/29/2016  . Hypokalemia 10/21/2016  . Normocytic anemia 10/24/2016  . Transaminitis 11/06/2016  . Thrombocytopenia ([place]) 10/25/2016  . Insomnia 06/15/2016  . Encounter for antineoplastic chemotherapy 03/30/2016  . Small cell carcinoma of lung, right ([place]) 03/17/2016  . PTSD (post-traumatic stress disorder) 07/18/2011  . SMOKER 03/30/2010  . Essential hypertension 03/30/2010  . HYPERCHOLESTEROLEMIA 03/27/2010  . ARTHRITIS 03/27/2010  . CHEST PAIN 03/27/2010    Palliative Care Assessment & Plan   Patient Profile: 66 y.o. female  with past medical history of extensive small cell lung cancer discovered in October of  this year. She is status post radiation as well as chemotherapy. She had a new finding of brain metastases in March of this year and also had whole brain radiation as well as spinal radiation. She had a CT scan on 10/14/2016 and was subsequently admitted after discovery of saddle pulmonary embolus with noted progression of her cancer.  Her course was further complicated by GI bleed following initiation of Lovenox therapy for PE.  Has since been restarted on heparin.  Palliative consulted for goals of care.     Recommendations/Plan:  Add Morphine drip, continue Morphine IV PRN  D/C PO medications, patient is no longer awake/alert.  Family wants to continue care in the hospital, son is hesitant about moving the patient to hospice, he believes that would cause more discomfort for the patient, he states that she is no longer responding to him.   Goals of Care and Additional Recommendations:  Limitations on Scope of Treatment: Full Comfort Care  Code Status:    Code Status Orders        Start     Ordered   10/24/16 1646  Do not attempt resuscitation (DNR)  Continuous    Question Answer Comment  In the event of cardiac or respiratory ARREST Do not call a "code blue"   In the event of cardiac or respiratory ARREST Do not perform Intubation, CPR, defibrillation or ACLS   In the event of cardiac or respiratory ARREST Use medication by any route, position, wound care, and other measures to relive pain and suffering. May use oxygen, suction and manual treatment of airway obstruction as needed for comfort.      10/24/16 1645    Code Status History    Date Active Date Inactive Code Status Order ID Comments User Context   10/12/2016  8:20 PM 10/24/2016  4:45 PM Full Code [id number]  Edwin Dada, MD ED    Advance Directive Documentation     Most Recent Value  Type of Advance Directive  Healthcare Power of Attorney, Living will  Pre-existing out of facility DNR order (yellow form or pink  MOST form)  -  "MOST" Form in Place?  -       Prognosis:  hours to days, ?final 24 hours  She has advanced cancer with recent GI bleed.  She has a known saddle PE as well as extensive known DVT and plan to stop anticoagulation.  I believe her prognosis is now limited to the next few hours or so  and she is at very high risk of death with high symptom burden (either from bleeding, or, more likely, massive PE)   Discharge Planning:  Anticipated hospital death, would recommend patient not be moved to hospice facility, given patient's imminent death, and family preference.   Care plan was discussed with patient, son, Dr. Alfredia Ferguson, RN Thank you for allowing the Palliative Medicine Team to assist in the care of this patient.   Total Time 35 minutes Prolonged Time Billed No      Greater than 50%  of this time was spent counseling and coordinating care related to the above assessment and plan.  Loistine Chance, MD [phone]  Please contact Palliative Medicine Team phone at [phone] for questions and concerns.

## 2016-11-12 NOTE — Progress Notes (Signed)
Nutrition Brief Note  Pt screened for low Braden. Chart reviewed. 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 Reviewed Palliative Care note from this AM which states pt is unresponsive and son is hesitant to move pt to hospice facility d/t concern for her comfort. Pt now transitioning to comfort care measures.  No nutrition interventions warranted at this time.  Please re-consult as needed.     Jarome Matin, MS, RD, LDN, [facility] Inpatient Clinical Dietitian Pager # [phone] After hours/weekend pager # [phone]

## 2016-11-12 NOTE — Progress Notes (Signed)
RN called [facility] for referral for patient who had expired. RN was informed that patient was an eye only donor candidate by Murlean Caller from [facility]. RN informed son, Vauda Salvucci, that the patient was being considered for eye donation. He expressed to the RN that the patient has a Living Will that states she does not want form of organ donations to take place and he also states that it is against their cultural beliefs to have organ donations performed. RN called back to [employer] and spoke with Saralyn Pilar again to inform him of the patient's and families wishes. Saralyn Pilar stated that he could not take the refusal and that RN would have to speak with [facility] about refusal of donation. Saralyn Pilar put RN through to [place]lyn [place] from [employer] in [employer] who stated that he had to take the refusal from the next of kin or HCPOA. RN entered the patient room and informed the son of the situation. The phone was put onto speaker phone and the son, Zelijiko Drone, gave a refusal of donor services to [facility] Stabs due to his mother's living will and cultural beliefs. Gerald Stabs accepted the refusal and gave a Full Release via telephone to [place], [place].   11/03/2016  4:19 PM Kaylyn Layer

## 2016-11-12 NOTE — Death Summary Note (Signed)
DEATH SUMMARY   Patient Details  Name: [name] MRN: [id number] DOB: 28-Jun-1950  Admission/Discharge Information   Admit Date:  11-08-16  Date of Death: Date of Death: 11-12-2016  Time of Death: Time of Death: 08-28-1253  Length of Stay: 4  Referring Physician: [name], PA-C   Reason(s) for Hospitalization  Abnormal Imaging, Dyspnea and Fatigue  Diagnoses  Preliminary cause of death:    Acute Respiratory Failure with Hypoxia 2/2 to Acute Saddle PE  Secondary Diagnoses (including complications and co-morbidities):  1. Acute Saddle Pulmonary Embolism   2. Acute Lower GI bleed   3. Abnormal LFT's/Transaminitis:  4. Hypokalemia, improved  5. Hyponatremia, improving  6. Hypomagnesemia  7. 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 Thrombocytopenia  9. Leukocytosis  10. Elevated Alk Phos   Principal Problem:   Acute saddle pulmonary embolism without acute cor pulmonale (HCC) Active Problems:   Essential hypertension   Small cell carcinoma of lung, right (HCC)   Hyponatremia   Hypokalemia   Normocytic anemia   Transaminitis   Thrombocytopenia (HCC)   GI bleed   Abnormal LFTs   Brief Hospital Course (including significant findings, care, treatment, and services provided and events leading to death)  [name] is a 66 y.o. year old female with a past medical history significant for squamous Lung cancer widely metastatic, HTNwho presented with PE by imaging, dyspena and fatigue.The patient was diagnosed with squamous lung cancer in 08-29-2015, underwent palliative radiation, then carboplatin and etoposide. Within last few months, she was referred for peripheral lactic cranial irradiation, but prior to treatment MRI brain showed 36 small metastases. Since then she has been on palliative radiation to the lumbar spine as well as whole brain radiation for the last month.   In the last few weeks, she has been complaining of worsening  left-sided abdominal pain or abdominal wall pain, and fatigue, so a week ago her radiation oncology team ordered a CT of the chest abdomen and pelvis which was performed today. In the interim, in the last few days, the patient's son has noticed a sudden change in her activity (she was ambulating herself, getting outside a little, somewhat active, but suddenly stopped). She denied chest pain, but endorses fatigue and dyspnea (and appears to be breathing heavy per son). Imaging showed saddle pulmonary embolism without right heart strain, new left rib soft tissue metastasis, new right supraclavicular metastasis, and widespread osseous metastasis. She was sent to the emergency room.   She was admitted and anticoagulated with Lovenox Full dose and Oncology was consulted. Unfortunately patient developed an acute GI Bleed so A/C was held and Gastroenterology was consulted for evaluation and a NM Scan was ordered. Patient subsequently did not have any more GI Bleeding and Hb/Hct remained stable. Palliative Care Medicine was consulted for goals of Care as patient wanted all treatment interventions offered. After discussion with GI and Palliative Care patient wanted to try Heparin gtt to see if she bleeds again and wishes to speak to her Primary Oncologist to see if he has further interventions for her cancer. Patient was open to going to Hospice but wants to talk to Dr. Inda Merlin first to see if he has any other recommendations.  Dr. Inda Merlin came to see the patient and felt as if she would not be a good candidate to start systemic chemotherapy again and recommended Palliative Care and Hospice at this point. Dr. Domingo Cocking met with the family and the patient and they elected to  pursue placement at [facility] for end of life care with a focus on comfort. She was made a DO NOT RESUSCITATE. Heparin gtt was stopped and and patient was observed overnight however decompensated further and became to unstable for transfer. She  started becoming Hypoxic so she was placed on a Ventimask. Patient became unresponsive this AM and subsequently passed away at 12:55 pm on a Morphine drip.   Pertinent Labs and Studies  Significant Diagnostic Studies Ct Chest W Contrast  Result Date: 10/12/2016 CLINICAL DATA:  Lung cancer diagnosed in 2017. Chemotherapy and radiation therapy complete. Leg swelling fatigue. Hysterectomy. Right-sided small cell primary. EXAM: CT CHEST, ABDOMEN, AND PELVIS WITH CONTRAST TECHNIQUE: Multidetector CT imaging of the chest, abdomen and pelvis was performed following the standard protocol during bolus administration of intravenous contrast. CONTRAST:  149m ISOVUE-300 IOPAMIDOL (ISOVUE-300) INJECTION 61% COMPARISON:  07/26/2016 and thoracolumbar spine MR 08/28/2016. FINDINGS: CT CHEST FINDINGS Cardiovascular: Aortic atherosclerosis. Borderline cardiomegaly, without pericardial effusion. Lad coronary artery atherosclerosis. Central, saddle type pulmonary embolism, including on image 22/series 2. No evidence of right heart strain. Mediastinum/Nodes: Development of a right supraclavicular nodal mass which measures 4.1 x 5.1 cm on image 9/series 2. No axillary adenopathy. No residual mediastinal adenopathy. No hilar adenopathy. Lungs/Pleura: Trace right pleural fluid is new. Minimal motion degradation throughout. Bilateral paramediastinal upper lobe ground-glass opacity and septal thickening, likely radiation induced. No residual pulmonary nodules are seen. Musculoskeletal: Soft tissue metastasis peripheral to the posterior left tenth and eleventh ribs is new and measures 4.1 x 1.8 cm on image 50/series 2. Similar widespread osseous metastasis. CT ABDOMEN PELVIS FINDINGS Hepatobiliary: Minimal motion continuing into the abdomen. Subtly heterogeneous hepatic density, without well-defined dominant mass. Hepatomegaly at 21.5 cm craniocaudal. Normal gallbladder, without biliary ductal dilatation. Pancreas: Normal, without mass  or ductal dilatation. Spleen: Normal in size, without focal abnormality. Adrenals/Urinary Tract: Minimal left adrenal thickening is unchanged. Right adrenal mass measures 3.3 cm and was consistent with an adenoma on prior PET. Heterogeneous renal enhancement, greater on the left. Most apparent on delayed images. No hydronephrosis. Normal urinary bladder. Stomach/Bowel: Normal stomach, without wall thickening. Normal colon, appendix, and terminal ileum. Normal small bowel. Vascular/Lymphatic: Advanced aortic and branch vessel atherosclerosis. No abdominopelvic adenopathy. Reproductive: Hysterectomy.  No adnexal mass. Other: No significant free fluid. No evidence of omental or peritoneal disease. Musculoskeletal: Soft tissue/muscular metastasis along the left obturator internus muscle measures 3.0 x 1.9 cm on image 109/ series 2 and is new. Heterogeneous marrow density is again identified and indicative of osseous metastasis. A right-sided L2 lesion measures 1.6 cm on image 67/series 2 and is unchanged. IMPRESSION: 1. Large volume saddle type pulmonary embolism, without right heart strain. These results will be called to the ordering clinician or representative by the Radiologist Assistant, and communication documented in the PACS or zVision Dashboard. 2. Progressive disease, as evidenced by development of right supraclavicular nodal mass and soft tissue/muscular metastasis as detailed above. 3. Subtle heterogeneous hepatic density which is nonspecific but could represent early hepatic metastasis. Recommend attention on follow-up versus more definitive characterization with pre and post contrast abdominal MRI. 4. New small right pleural effusion. 5. Right adrenal adenoma. 6. Heterogeneous renal enhancement, suspicious for pyelonephritis. Correlate with urinalysis. Electronically Signed   By: KAbigail MiyamotoM.D.   On: 10/20/2016 16:29   Ct Abdomen Pelvis W Contrast  Result Date: 11/05/2016 CLINICAL DATA:  Lung cancer  diagnosed in 2017. Chemotherapy and radiation therapy complete. Leg swelling fatigue. Hysterectomy. Right-sided small cell primary. EXAM:  CT CHEST, ABDOMEN, AND PELVIS WITH CONTRAST TECHNIQUE: Multidetector CT imaging of the chest, abdomen and pelvis was performed following the standard protocol during bolus administration of intravenous contrast. CONTRAST:  150m ISOVUE-300 IOPAMIDOL (ISOVUE-300) INJECTION 61% COMPARISON:  07/26/2016 and thoracolumbar spine MR 08/28/2016. FINDINGS: CT CHEST FINDINGS Cardiovascular: Aortic atherosclerosis. Borderline cardiomegaly, without pericardial effusion. Lad coronary artery atherosclerosis. Central, saddle type pulmonary embolism, including on image 22/series 2. No evidence of right heart strain. Mediastinum/Nodes: Development of a right supraclavicular nodal mass which measures 4.1 x 5.1 cm on image 9/series 2. No axillary adenopathy. No residual mediastinal adenopathy. No hilar adenopathy. Lungs/Pleura: Trace right pleural fluid is new. Minimal motion degradation throughout. Bilateral paramediastinal upper lobe ground-glass opacity and septal thickening, likely radiation induced. No residual pulmonary nodules are seen. Musculoskeletal: Soft tissue metastasis peripheral to the posterior left tenth and eleventh ribs is new and measures 4.1 x 1.8 cm on image 50/series 2. Similar widespread osseous metastasis. CT ABDOMEN PELVIS FINDINGS Hepatobiliary: Minimal motion continuing into the abdomen. Subtly heterogeneous hepatic density, without well-defined dominant mass. Hepatomegaly at 21.5 cm craniocaudal. Normal gallbladder, without biliary ductal dilatation. Pancreas: Normal, without mass or ductal dilatation. Spleen: Normal in size, without focal abnormality. Adrenals/Urinary Tract: Minimal left adrenal thickening is unchanged. Right adrenal mass measures 3.3 cm and was consistent with an adenoma on prior PET. Heterogeneous renal enhancement, greater on the left. Most apparent  on delayed images. No hydronephrosis. Normal urinary bladder. Stomach/Bowel: Normal stomach, without wall thickening. Normal colon, appendix, and terminal ileum. Normal small bowel. Vascular/Lymphatic: Advanced aortic and branch vessel atherosclerosis. No abdominopelvic adenopathy. Reproductive: Hysterectomy.  No adnexal mass. Other: No significant free fluid. No evidence of omental or peritoneal disease. Musculoskeletal: Soft tissue/muscular metastasis along the left obturator internus muscle measures 3.0 x 1.9 cm on image 109/ series 2 and is new. Heterogeneous marrow density is again identified and indicative of osseous metastasis. A right-sided L2 lesion measures 1.6 cm on image 67/series 2 and is unchanged. IMPRESSION: 1. Large volume saddle type pulmonary embolism, without right heart strain. These results will be called to the ordering clinician or representative by the Radiologist Assistant, and communication documented in the PACS or zVision Dashboard. 2. Progressive disease, as evidenced by development of right supraclavicular nodal mass and soft tissue/muscular metastasis as detailed above. 3. Subtle heterogeneous hepatic density which is nonspecific but could represent early hepatic metastasis. Recommend attention on follow-up versus more definitive characterization with pre and post contrast abdominal MRI. 4. New small right pleural effusion. 5. Right adrenal adenoma. 6. Heterogeneous renal enhancement, suspicious for pyelonephritis. Correlate with urinalysis. Electronically Signed   By: KAbigail MiyamotoM.D.   On: 10/30/2016 16:29   Physical Exam on Death: General: Patient is unresponsive to verbal and physical stimuli Eyes: Pupils Fixed and Dilated. No Corneal Reflexes Lungs: No ausculated Breath Sounds, No spontaneous breathing Heart/CV: No auscultated Heart Beat after 1 minute of auscultation; No palpable pulses in brachial, femoral, or pedal pulse  Microbiology No results found for this or any  previous visit (from the past 240 hour(s)).   Consultants:   Oncology Dr. GAlcide Evener Gastroenterology Dr. BRodney Langton Palliative Care Medicine Dr. FLorain Childes Lab Basic Metabolic Panel:  Recent Labs Lab 11/10/2016 1401 10/23/2016 1909 10/23/16 0600 10/24/16 0509 10/25/16 0522  NA 133* 129* 131* 133* 134*  K 3.3* 2.5* 3.3* 4.9 3.7  CL  --  89* 94* 99* 100*  CO2 '28 29 28 24 23  ' GLUCOSE 179* 153* 108* 95  89  BUN 15.'7 14 12 15 15  ' CREATININE 0.8 0.58 0.50 0.52 0.47  CALCIUM 9.9 8.8* 8.8* 9.2 9.0  MG  --  1.8 1.7 1.8 1.6*  PHOS  --   --  2.9 3.6 3.0   Liver Function Tests:  Recent Labs Lab 11/04/2016 1401 11/08/2016 1909 10/23/16 0600 10/24/16 0509 10/25/16 0522  AST 192* 144* 123* 136* 148*  ALT 190* 158* 144* 160* 156*  ALKPHOS 436* 359* 339* 360* 390*  BILITOT 2.18* 1.4* 1.6* 3.4* 3.8*  PROT 6.7 5.9* 5.4* 5.5* 5.3*  ALBUMIN 2.5* 2.6* 2.3* 2.3* 2.4*   No results for input(s): LIPASE, AMYLASE in the last 168 hours. No results for input(s): AMMONIA in the last 168 hours. CBC:  Recent Labs Lab 11/07/2016 1401  11/03/2016 1909 10/23/16 0600  10/23/16 2357 10/24/16 0509 10/24/16 1122 10/24/16 2313 10/25/16 0522  WBC 11.5*  --  10.1 10.4  --   --  10.9*  --  9.6 7.7  NEUTROABS 9.8*  --  8.5*  --   --   --  9.4*  --   --  6.5  HGB 10.3*  < > 9.2* 9.1*  < > 9.3* 9.2* 9.4* 8.8* 9.2*  HCT 31.7*  < > 28.0* 27.5*  < > 29.5* 28.7* 28.7* 27.5* 29.6*  MCV 93.2  --  91.8 92.3  --   --  95.0  --  94.8 95.8  PLT 86*  --  79* 70*  --   --  72*  --  72* 73*  < > = values in this interval not displayed. Cardiac Enzymes: No results for input(s): CKTOTAL, CKMB, CKMBINDEX, TROPONINI in the last 168 hours. Sepsis Labs:  Recent Labs Lab 10/23/16 0600 10/24/16 0509 10/24/16 2313 10/25/16 0522  WBC 10.4 10.9* 9.6 7.7    Procedures/Operations  ECHOCARDIOGRAM Study Conclusions  - Left ventricle: The cavity size was normal. Systolic function  was vigorous. The estimated ejection fraction was in the range of 65% to 70%. Wall motion was normal; there were no regional wall motion abnormalities. - Tricuspid valve: There was mild-moderate regurgitation. - Pulmonary arteries: Systolic pressure was mildly to moderately increased. PA peak pressure: 45 mm Hg (S).  [place] 11/15/16, 1:28 PM

## 2016-11-12 NOTE — Consult Note (Signed)
[place] received request for family interest in [facility]. Chart currently under review. Appreciate report from Dr. Domingo Cocking. Will follow up with family and CSW today. Thank you for this referral.   Erling Conte, LCSW [phone]

## 2016-11-12 NOTE — Clinical Social Work Note (Signed)
Per Harmon Pier at [facility], patient has a bed today and may transfer to inpatient hospice facility.  Dede Query, [place] Worker  cell #: [phone]

## 2016-11-12 NOTE — Progress Notes (Signed)
Pt sats 83% on 6 liters O2 nasal cannula upon assessment of patient, called respiratory to come take a look at her. He decided to put a venti mask on at 14 liters at 55%, more for comfort for the family and patient. Since then patient has gotten progressively worse. She has had fluids running but only had an output of 300 for the whole night. Her oxygen sats currently are 79% on the 14 liters 55% venti mask. Morphine IV and Ativan IV have been given during the night to help with breathing, pain, and agitation. Family has been at bedside all night. Family has watched patient's monitor throughout the night asking RN if they wer aware that her heart rate was sustaining in the 140's. Passed on to day shift RN all the details that happened through the night and updated her on [name]'s status.  Carmela Hurt, RN

## 2016-11-12 NOTE — Care Management Note (Signed)
Case Management Note  Patient Details  Name: [name] MRN: [id number] Date of Birth: 1951/01/15  Subjective/Objective: Per Son/spouse-doesn't feel its appropriate to transfer @ this time,wishes for patient to stay in hospital considering her current condition.                   Action/Plan:Hospital Death   Expected Discharge Date:                  Expected Discharge Plan:   [facility] Death-agonal;venti mask;unresponsive;morphine gtt)  In-House Referral:  Clinical Social Work  Discharge planning Services  CM Consult  Post Acute Care Choice:    Choice offered to:     DME Arranged:    DME Agency:     HH Arranged:    [place] Agency:     Status of Service:  Completed, signed off  If discussed at [employer], dates discussed:    Additional Comments:  Dessa Phi, RN 11-21-2016, 10:56 AM

## 2016-11-12 DEATH — deceased

## 2018-07-21 IMAGING — MR MR HEAD WO/W CM
11 of 14 series · 34 of 48 positions shown · IV contrast (multihance)
Comparison: CT 02/26/2010

CLINICAL DATA: New diagnosis lung cancer.  Initial staging.

EXAM:
MRI HEAD WITHOUT AND WITH CONTRAST
TECHNIQUE: Multiplanar, multiecho pulse sequences of the brain and surrounding
structures were obtained without and with intravenous contrast.
CONTRAST:  14mL MULTIHANCE GADOBENATE DIMEGLUMINE 529 MG/ML IV SOLN

[Series 3: T1 · sagittal · 5.0mm · 0.47mm/px · 1 of 24 slices shown]
[im 1/24]
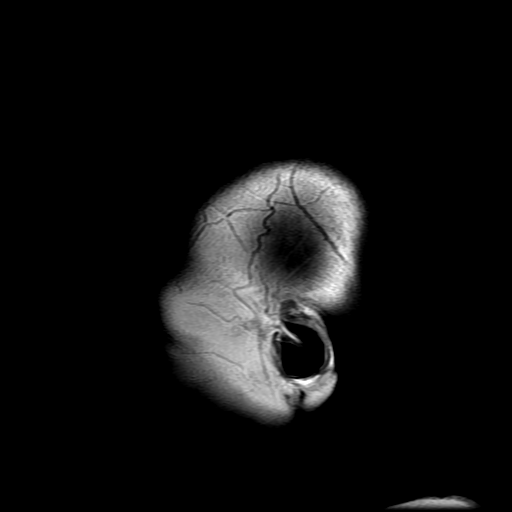

[Series 4: DWI · axial · 3.0mm · 1.09mm/px · z∈[-67,+71]mm · 8 of 94 slices shown (1 of 4)]
[im 1/94]
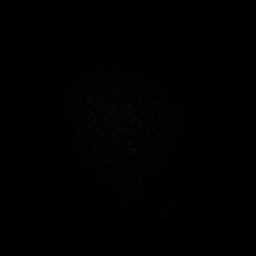
[im 14/94]
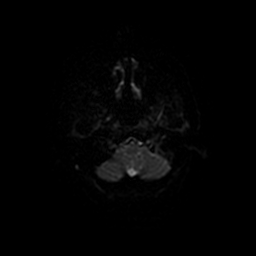
[im 27/94]
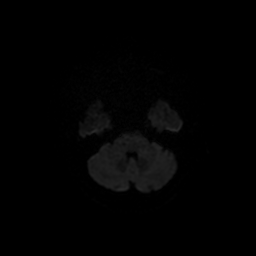
[im 40/94]
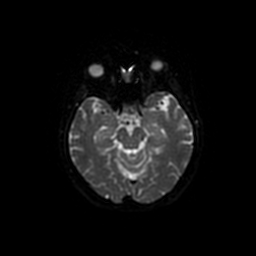
[im 54/94]
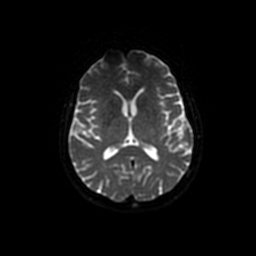
[im 67/94]
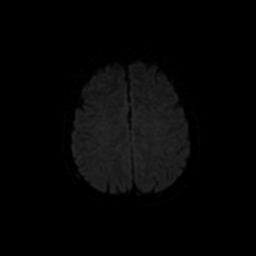
[im 80/94]
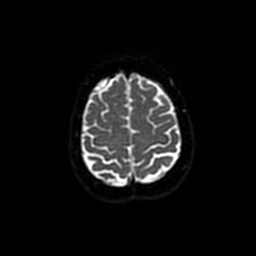
[im 94/94]
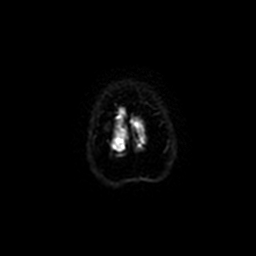

[Series 5: DWI · coronal · 5.0mm · 1.09mm/px · 6 of 64 slices shown (2 of 4)]
[im 1/64]
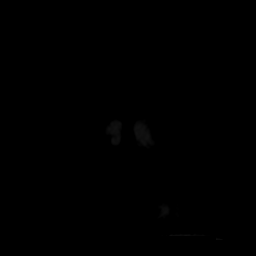
[im 13/64]
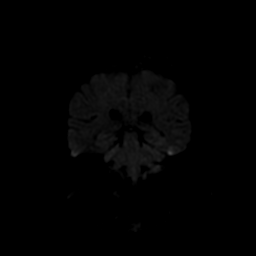
[im 26/64]
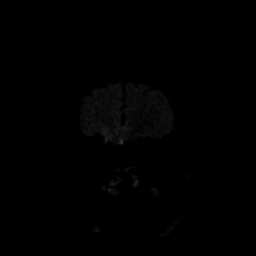
[im 38/64]
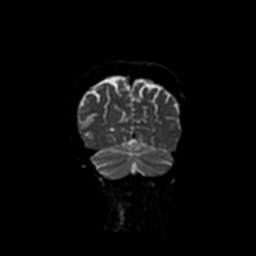
[im 51/64]
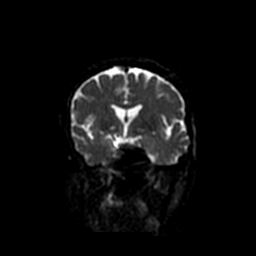
[im 64/64]
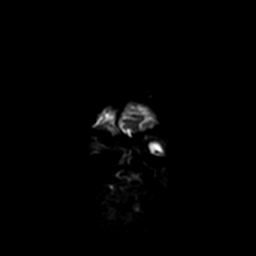

[Series 6: T2 · axial · 5.0mm · 0.43mm/px · z∈[-75,+74]mm · 2 of 24 slices shown]
[im 1/24]
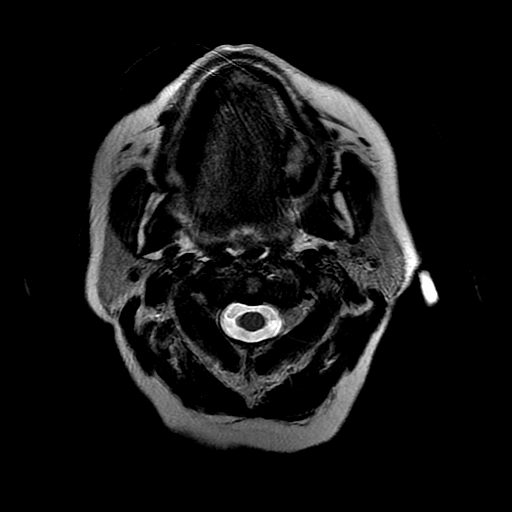
[im 24/24]
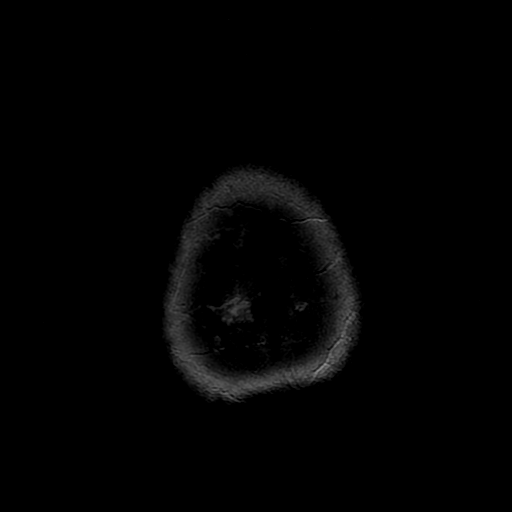

[Series 7: FLAIR · axial · 5.0mm · 0.43mm/px · z∈[-81,+80]mm · 2 of 28 slices shown]
[im 1/28]
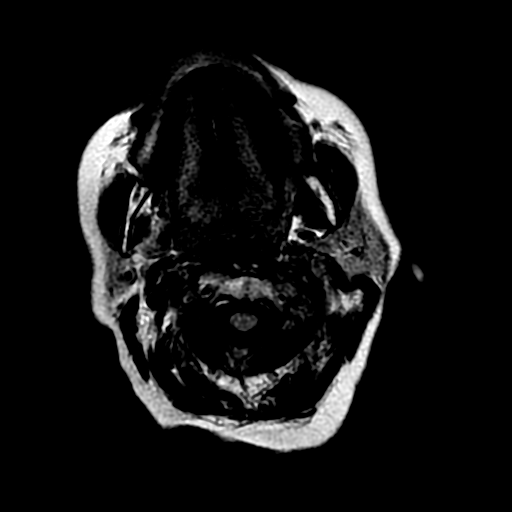
[im 28/28]
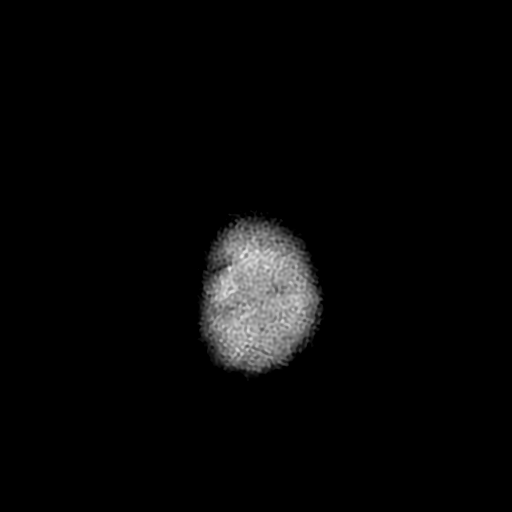

[Series 11: T2 post-contrast · coronal · 5.0mm · 0.47mm/px · 2 of 24 slices shown]
[im 1/24]
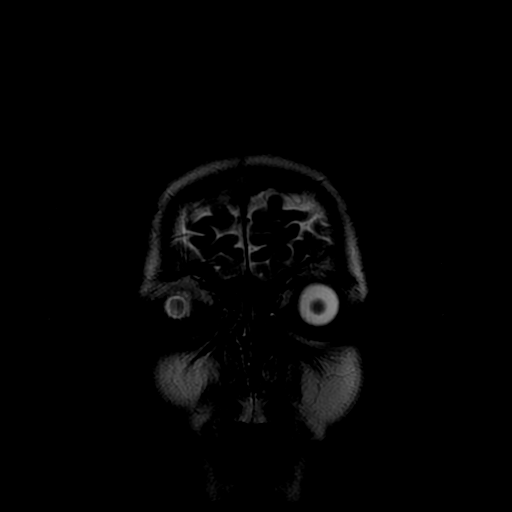
[im 24/24]
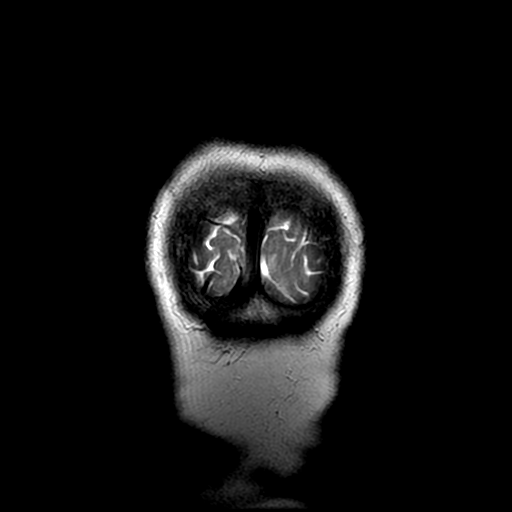

[Series 13: T1 post-contrast · coronal · 5.0mm · 0.47mm/px · 2 of 24 slices shown (1 of 3)]
[im 1/24]
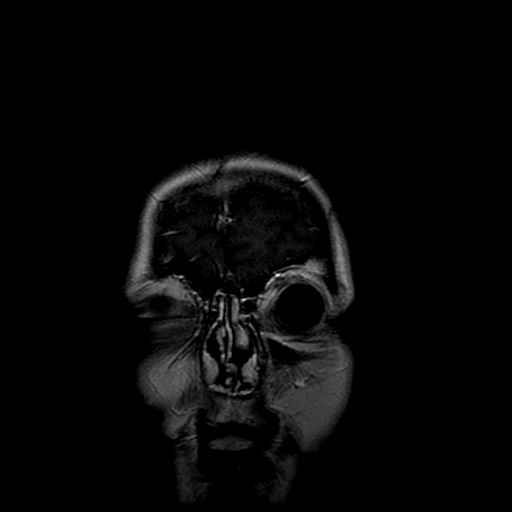
[im 24/24]
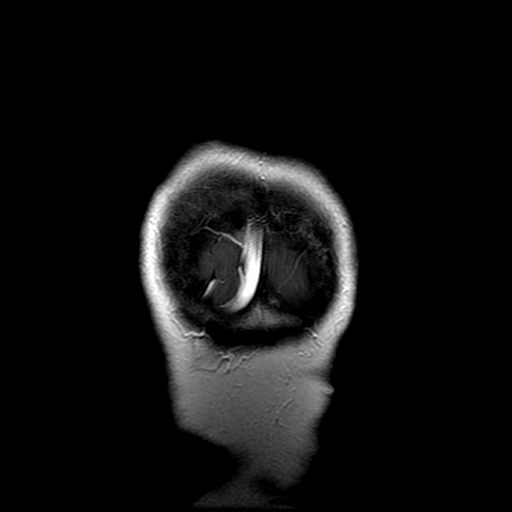

[Series 14: T1 post-contrast · coronal · 5.0mm · 0.47mm/px · 2 of 24 slices shown (2 of 3)]
[im 1/24]
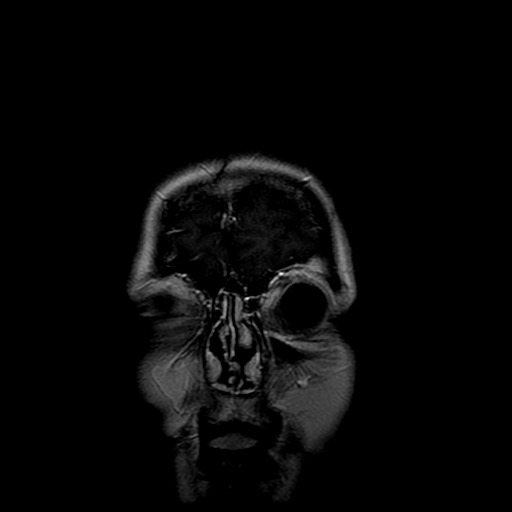
[im 24/24]
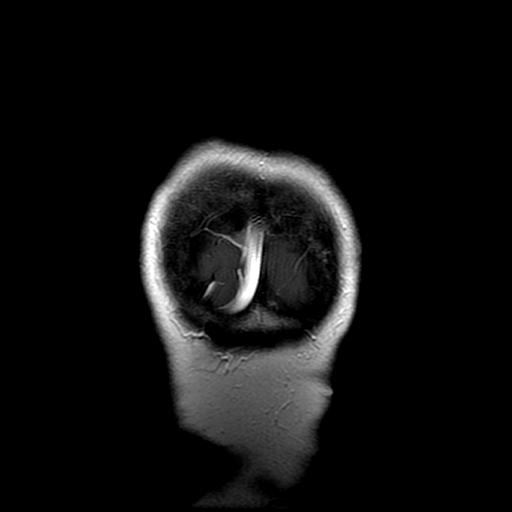

[Series 15: T1 post-contrast · sagittal · 5.0mm · 0.47mm/px · 2 of 24 slices shown (3 of 3)]
[im 1/24]
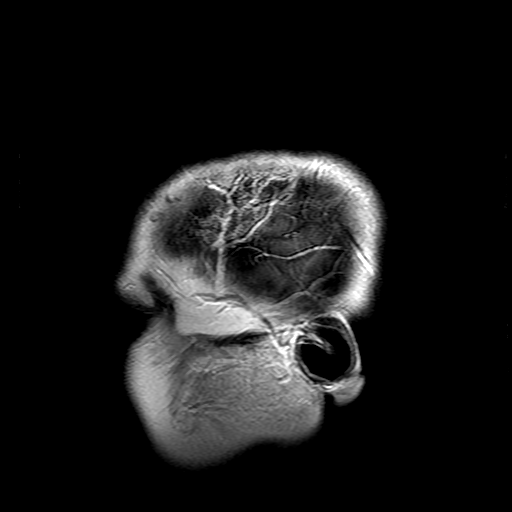
[im 24/24]
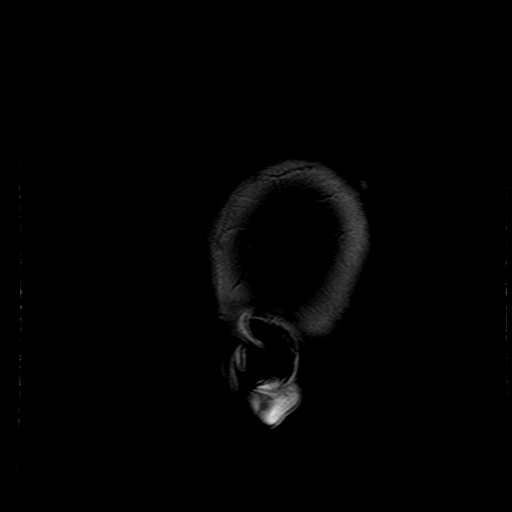

[Series 400: DWI · axial · 3.0mm · 1.09mm/px · z∈[-67,+71]mm · 4 of 47 slices shown (3 of 4)]
[im 1/47]
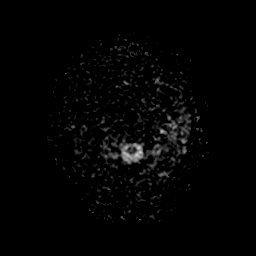
[im 16/47]
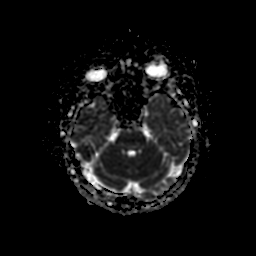
[im 31/47]
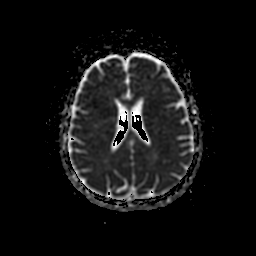
[im 47/47]
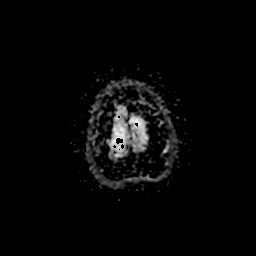

[Series 500: DWI · coronal · 5.0mm · 1.09mm/px · 3 of 32 slices shown (4 of 4)]
[im 1/32]
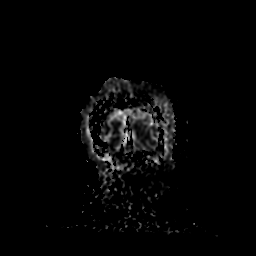
[im 16/32]
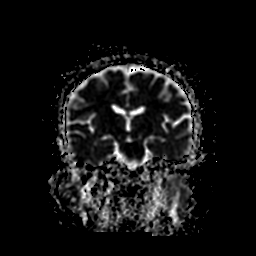
[im 32/32]
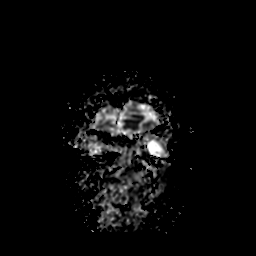

[34 of 48 positions shown; findings below may reference images not displayed]

FINDINGS: Brain: The brain does not show accelerated atrophy. There is no
evidence of old or acute small or large vessel infarction. No mass
lesion, hemorrhage, hydrocephalus or extra-axial collection.
Calcification affects the basal ganglia. After contrast
administration, no abnormal enhancement occurs.

Vascular: Major vessels at the base of the brain show flow.

Skull and upper cervical spine: Likely metastatic lesions affecting
the right side of the C1 vertebral body, the dens and the spinous
process of C2 in the vertebral body of C3.

Sinuses/Orbits: Clear/normal

Other: None significant
IMPRESSION: No metastatic disease to the brain.

Metastatic disease affecting the right side of the C1 vertebral
body, the dens, the spinous process of C2 and the C3 vertebral body.
